# Patient Record
Sex: Female | Born: 1940 | Race: White | Hispanic: No | Smoking: Former smoker
Health system: Southern US, Community
[De-identification: ages and names within clinical notes are randomized; demographics above are authoritative.]

## PROBLEM LIST (undated history)

## (undated) DIAGNOSIS — E7800 Pure hypercholesterolemia, unspecified: Secondary | ICD-10-CM

## (undated) DIAGNOSIS — J189 Pneumonia, unspecified organism: Secondary | ICD-10-CM

## (undated) DIAGNOSIS — K219 Gastro-esophageal reflux disease without esophagitis: Secondary | ICD-10-CM

## (undated) DIAGNOSIS — IMO0001 Reserved for inherently not codable concepts without codable children: Secondary | ICD-10-CM

## (undated) DIAGNOSIS — M1990 Unspecified osteoarthritis, unspecified site: Secondary | ICD-10-CM

## (undated) DIAGNOSIS — I10 Essential (primary) hypertension: Secondary | ICD-10-CM

## (undated) HISTORY — PX: APPENDECTOMY: SHX54

## (undated) HISTORY — PX: JOINT REPLACEMENT: SHX530

## (undated) HISTORY — PX: TUBAL LIGATION: SHX77

## (undated) HISTORY — PX: TOTAL HIP ARTHROPLASTY: SHX124

---

## 2000-03-24 ENCOUNTER — Encounter: Admission: RE | Admit: 2000-03-24 | Discharge: 2000-04-15 | Payer: Self-pay | Admitting: Orthopedic Surgery

## 2000-04-15 ENCOUNTER — Encounter: Admission: RE | Admit: 2000-04-15 | Discharge: 2000-05-04 | Payer: Self-pay | Admitting: Neurosurgery

## 2001-08-25 ENCOUNTER — Emergency Department (HOSPITAL_COMMUNITY): Admission: EM | Admit: 2001-08-25 | Discharge: 2001-08-25 | Payer: Self-pay | Admitting: Emergency Medicine

## 2005-04-29 ENCOUNTER — Emergency Department (HOSPITAL_COMMUNITY): Admission: EM | Admit: 2005-04-29 | Discharge: 2005-04-29 | Payer: Self-pay | Admitting: Emergency Medicine

## 2005-05-01 ENCOUNTER — Inpatient Hospital Stay (HOSPITAL_COMMUNITY): Admission: EM | Admit: 2005-05-01 | Discharge: 2005-05-04 | Payer: Self-pay | Admitting: Emergency Medicine

## 2005-10-01 ENCOUNTER — Encounter: Admission: RE | Admit: 2005-10-01 | Discharge: 2005-10-01 | Payer: Self-pay | Admitting: Orthopedic Surgery

## 2006-01-26 ENCOUNTER — Encounter: Admission: RE | Admit: 2006-01-26 | Discharge: 2006-01-26 | Payer: Self-pay | Admitting: Orthopedic Surgery

## 2006-06-08 ENCOUNTER — Encounter: Admission: RE | Admit: 2006-06-08 | Discharge: 2006-06-08 | Payer: Self-pay | Admitting: Orthopedic Surgery

## 2006-09-12 ENCOUNTER — Inpatient Hospital Stay (HOSPITAL_COMMUNITY): Admission: EM | Admit: 2006-09-12 | Discharge: 2006-09-14 | Payer: Self-pay | Admitting: Emergency Medicine

## 2006-09-13 ENCOUNTER — Encounter (INDEPENDENT_AMBULATORY_CARE_PROVIDER_SITE_OTHER): Payer: Self-pay | Admitting: Pediatrics

## 2006-12-26 ENCOUNTER — Encounter: Admission: RE | Admit: 2006-12-26 | Discharge: 2006-12-26 | Payer: Self-pay | Admitting: Orthopedic Surgery

## 2007-01-27 ENCOUNTER — Emergency Department (HOSPITAL_COMMUNITY): Admission: EM | Admit: 2007-01-27 | Discharge: 2007-01-27 | Payer: Self-pay | Admitting: Emergency Medicine

## 2007-04-05 ENCOUNTER — Encounter: Admission: RE | Admit: 2007-04-05 | Discharge: 2007-04-05 | Payer: Self-pay | Admitting: Orthopedic Surgery

## 2008-04-19 ENCOUNTER — Encounter: Admission: RE | Admit: 2008-04-19 | Discharge: 2008-04-19 | Payer: Self-pay | Admitting: Orthopedic Surgery

## 2009-07-21 ENCOUNTER — Encounter: Admission: RE | Admit: 2009-07-21 | Discharge: 2009-07-21 | Payer: Self-pay | Admitting: Orthopedic Surgery

## 2010-02-08 HISTORY — PX: TOTAL HIP ARTHROPLASTY: SHX124

## 2010-02-17 ENCOUNTER — Encounter
Admission: RE | Admit: 2010-02-17 | Discharge: 2010-02-17 | Payer: Self-pay | Source: Home / Self Care | Attending: Orthopedic Surgery | Admitting: Orthopedic Surgery

## 2010-06-23 NOTE — Procedures (Signed)
REFERRING PHYSICIAN:  Dr. Antony Contras; this patient is referred to the  stroke service.   INDICATION:  This is a routine EEG for this patient who is described as  a 70 year old female, right-handed, with a sudden onset of difficulties  with speech and left onset of weakness.  The patient was also described  as confused and dizzy.  Unfortunately, there is no further history given  if the patient was diagnosed with a stroke or a seizure meanwhile.  Activation procedures include photic stimulation.  Hyperventilation was  deferred.   CURRENT MEDICATIONS:  Aspirin, Protonix, Zocor, Zestril, Lovenox,  Reglan, Tylenol, Vicodin.   DESCRIPTION:  This 16-channel EEG recording with 1 channel representing  heart rate and rhythm exclusively shows a high-amplitude fast beta  activity seen bitemporally and bioccipitally with the highest amplitude  depending on eye opening or eye closure.  This beta fast activity is  normally seen after benzodiazepine use, which is not listed in the  patient's medication.  Her posterior dominant rhythm is difficult to  establish; there seems to be an ongoing impedance problem.  The fastest  posterior rhythm appears to be at 10 Hz.  Bicentral spikes were noticed  over T5 and over the C4/P4 that appear isolated and seem not lead to any  epileptiform activity.  The spikes are not lateralized; they appear in  both hemispheres at the same time.  Unfortunately, there is no notation  if the patient appears drowsy or alert, which could help further  differentiate these from vertex sharp waves.  The patient appears to  have entered drowsiness by the 8th minute of this recording and then  symmetric sleep architecture is seen, but still beta fast activity is  overriding, especially the occipital recording.  Photic stimulation did  lead to photic entrainment between 7 and 17 Hz.  No electromyographic  artifact is provoked by this maneuver.  Again, hyperventilation was  deferred.   CONCLUSION:  This is an EEG with significant beta fast activity noted.  None of the medications that are normally causing this phenomenon are  listed in the patient's medication history.  I would recommend to repeat  this EEG when we can be sure that the patient did not receive any  benzodiazepine.   Sincerely,      Larey Seat, M.D.  Electronically Signed     [id number]  D:  09/13/2006 17:53:21  T:  09/14/2006 09:38:08  Job #:  [id number]

## 2010-06-23 NOTE — H&P (Signed)
NAME:  Robin [name], Robin [name]                 ACCOUNT NO.:  [id number]   MEDICAL RECORD NO.:  [id number]          PATIENT TYPE:  INP   LOCATION:  [place]                         FACILITY:  [facility]   PHYSICIAN:  Princess Bruins. Hickling, M.D.DATE OF BIRTH:  09/01/1942   DATE OF ADMISSION:  09/12/2006  DATE OF DISCHARGE:                              HISTORY & PHYSICAL   CHIEF COMPLAINT:  Unable to speak, flaccid left side, vomiting and  choking.   HISTORY OF THE PRESENT ILLNESS:  The patient is a 70 year old Caucasian  woman, married, who collapsed in the courtroom following loss of custody  of her grandson.  She developed a fixed stare on her face.  She slumped.  She was unable to speak.  En route to [facility] by EMS, she  was noted to be vomiting and had to be suctioned for her secretions.  She has never had a prior stroke and has no heart disease.  Her risk  factors for stroke include hypertension and dyslipidemia.  She has been  compliant with medications.   On initial evaluation as she entered the emergency room, I noted a fixed  gaze.  She did not respond to noxious stimuli in her limbs and her limbs  fell to the table x4.  She seemed to be laboring in her breathing.  She  was not vomiting or choking on her saliva.   The patient had a CT scan of the brain which I interpreted STAT at 1739;  this was normal.  I was able to assess her at 1750, after she had had  lead placed, blood drawn and Foley catheter inserted.   CURRENT MEDICATIONS:  1. Lisinopril 10 mg daily.  2. Pravastatin 80 mg daily.  3. Nexium 40 mg daily.  4. Hydrocodone 5/500 one every 6 hours as needed.  5. She takes other vitamins and fish oil at home.   DRUG ALLERGIES:  None known.   FAMILY HISTORY:  Negative for stroke.  Positive for myocardial  infarction and congestive heart failure, which was fatal her father,  diabetes mellitus,fatal in her mother, brain tumor, fatal in her  brother.  She has 1 brother and 3  sisters who are alive and well, other  than arthritis or other a mild conditions.   SOCIAL HISTORY:  The patient is retired from of farm working.  She is  married.  She used tobacco 30 years ago, but quit.  She does not use  alcohol or drugs.   REVIEW OF SYSTEMS:  Recorded and as negative except for headache and  postmenopausal state.   EXAMINATION:  VITAL SIGNS:  On examination today, temperature was not  measured.  Blood pressure was 132/63, resting pulse 103, respirations  20, oxygen saturation 100%.  HEENT/NECK:  No signs of infection.  Supple neck.  Full range of motion.  No cranial or cervical bruits.  No meningismus.  LUNGS:  Clear to  auscultation.  HEART:  No murmurs.  Pulses normal.  ABDOMEN:  Soft and nontender.  Bowel sounds normal.  EXTREMITIES:  Well-formed without edema, cyanosis, alterations in tone  or tight heel cords.  SKIN:  No lesions.  Vascular tone was normal.  NEUROLOGIC:  Mental Status:  Awake, alert.  She had dysarthria  initially, but this improved.  Visual fields are full.  Extraocular  movements full.  Symmetric facial strength.  Midline tongue.  Air  conduction greater than bone conduction.  Strength showed poor effort,  but she had adequate strength in her upper extremity, poor effort her  lower extremities, but she was able lift against gravity, wiggle her  toes and bear weight distally quite well.  She says she has no  hemisensory.  She had good stereoagnosis.  She had good finger-to-nose  and  heel-knee-shin.  Gait was not tested.  Reflexes were normal.  The  patient had bilateral flexor plantar responses.   IMPRESSION:  Transient ischemic attack. (435.8) I thought initially with  vomiting, problems with secretions, inability to speak and weakness on  the left side greater than right, that she might have a brainstem event.  I cannot prove this, but this may have been just as likely a psychogenic  event.   PLAN:  The patient will be admitted to  the hospital for MRI scan, MRA  and limited workup, based on the results of those studies.  Her NIH  stroke scale was 1, recorded at 6:17 p.m.  Her modified Rankin was 0.  She will be evaluated for a swallowing screen.  We will also carry out  noninvasive vascular workup, if the MRI scan shows abnormalities, and we  will carry out workup for hemoglobin A1c, homocysteine and fasting  lipid, again if the MRI scan shows evidence of stroke.  Otherwise, the  patient will be discharged home.  On her laboratories today, EKG showed  sinus tachycardia, right atrial enlargement, nonspecific changes.  Prothrombin time 13.3, INR 1.0, PTT 28.  Wajda blood cell count 7100,  hemoglobin 12, hematocrit 35.9, MCV 85.1, platelet count 375,000 with 82  neutrophils, 12 lymphocytes and 6 monocytes.   Sodium 143, potassium 4.4, chloride 108, CO2 24, BUN 16, creatinine  0.87, glucose elevated 152.  AST 16, ALT 13, calcium 9.3, total protein  7.4, albumin 3.7, total bilirubin 0.7, alkaline phosphatase 106.  For  venous pH is 7.48.      Princess Bruins. Gaynell Face, M.D.  Electronically Signed     WHH/MEDQ  D:  09/12/2006  T:  09/13/2006  Job:  [id number]

## 2010-06-23 NOTE — Discharge Summary (Signed)
NAMEBILLIJO, Robin [name]                 ACCOUNT NO.:  [id number]   MEDICAL RECORD NO.:  [id number]          PATIENT TYPE:  INP   LOCATION:  [id number]                         FACILITY:  [facility]   PHYSICIAN:  Larey Seat, M.D.  DATE OF BIRTH:  [birth year]   DATE OF ADMISSION:  09/12/2006  DATE OF DISCHARGE:  09/14/2006                               DISCHARGE SUMMARY   DIAGNOSIS AT TIME OF DISCHARGE.:  1. Likely conversion reaction versus right brain transient ischemic      attack.  2. Hypertension.  3. Dyslipidemia.  4. Gastroesophageal reflux disease.   MEDICINES AT TIME OF DISCHARGE:  1. Lisinopril 10 mg a day.  2. Pravastatin 80 mg a day.  3. Nexium 40 mg a day.  4. Hydrocodone 5/500 every 6 hours as needed.  5. Aspirin 325 mg a day.  6. Vitamins and fish oil as at home.   STUDIES PERFORMED:  1. CT of the brain on admission shows no acute abnormality.  2. MRI of the brain shows no acute stroke.  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  4. MRA of the head showed normal variant MRA circle of Willis.  5. EKG showed sinus tachycardia nonspecific ST wave abnormalities.  6. EEG showed no seizure activity.  7. Transcranial Doppler performed, results pending.  8. Carotid Doppler shows no ICA stenosis.  9. A 2-D echocardiogram shows EF of 50-55%, cannot rule out PFO.  No      likely source of embolus.   LABORATORY STUDIES:  CBC with hemoglobin 11.8, hematocrit 35.1,  neutrophils 79, otherwise normal.  Chemistry with glucose 141, otherwise  normal.  Coags normal.  Liver function tests normal.  Cardiac enzymes  negative.  Cholesterol 173, triglycerides 77, HDL 58 and LDL 100.  Urinalysis showed 0-2 red blood cells, 0-2 Gieske blood cells, 30  protein, otherwise normal.  Homocystine was 10.6, hemoglobin A1c 5.6.  Urine drug screen negative.   HISTORY OF PRESENT ILLNESS:  [name] is a 70 year old right-  handed, Caucasian, married female who  collapsed in the court room  following loss of custody of her grandson.  She developed a fixed stare  on her face.  She slumped over.  She was unable to speak.  En route to  [facility] by EMS, she was noted to be vomiting and had to be suctioned  for her secretions.  She has no prior history of stroke or heart  disease.  She has risk factors of hypertension and dyslipidemia.  She  has been compliant with her medications at home.   On initial evaluation as she entered to the emergency room, she had a  fixed gaze.  She did not respond to noxious stimuli in her limbs, and  her limbs were flaccid and fell to the table x4.  She seemed to be  labored in her breathing.  She was not vomiting at that time.  CT of the  brain showed no acute abnormalities.  TPA was considered.  TPA was not  given as she  had improvement in the emergency room and her neurologic  exam was questionably functional.  Thoughts are that she may have a  brainstem event.  She will be admitted to the hospital and further  workup there.   HOSPITAL COURSE:  MRI was negative for acute stroke.  A 2-D  echocardiogram, carotid Doppler and EEG were all unremarkable.  Hemoglobin A1c and homocystine were within normal limits.  The patient  had no acute stroke and no residual symptoms.  It is felt her symptoms  were most likely psychogenic in nature with no neurologic follow up  needed.  There does remain a small possibility this could have been a  TIA, so we do recommend ongoing risk factor control.   CONDITION ON DISCHARGE:  The patient alert and oriented x3..  No  aphasia, dysarthria.  Her extraocular movements are full.  She has no  focal deficits.  No arm drift, no sensory loss.  She has good control of  all extremities and normal strength.  Her heart rate is regular.  Breath  sounds were clear.   DISCHARGE/PLAN:  1. Discharge home with family.  2. Aspirin for stroke prevention.  3. Recommend ongoing stroke risk factor  control.  4. No neurologic follow up needed at this point, but will be glad to      follow up should situation change.  5. Follow up with Particia Nearing, primary care physician, [place], in      one month.      Burnetta Sabin, N.P.      Larey Seat, M.D.  Electronically Signed    SB/MEDQ  D:  09/14/2006  T:  09/14/2006  Job:  [id number]   cc:   Larey Seat, M.D.  [place] Dr. Particia Nearing

## 2010-06-26 NOTE — H&P (Signed)
NAMELABRENDA, RAQUEL NO.:  [id number]   MEDICAL RECORD NO.:  [id number]          PATIENT TYPE:  INP   LOCATION:  [id number]                         [facility]ACILITY:  [facility]   PHYSICIAN:  Jerelene Redden, MD      DATE OF BIRTH:  April 26, 1940   DATE OF ADMISSION:  05/01/2005  DATE OF DISCHARGE:                                HISTORY & PHYSICAL   Robin [name] is a pleasant 70 year old lady who states that although she  generally has problems with arthritis, particularly in her feet, she  generally gets along fairly well.  However, on Thursday morning, when she  went to get out of bed, she discovered that she had severe pain in the right  sacroiliac area radiating around to the anterior portion of the leg and down  to the knee.  This pain prevented her from raising her leg or from getting  out of bed.  When others helped her to get out of bed and she bore or weight  on her right leg the pain was extremely severe and although she was able to  stand for a short period she was not able to walk.  Since that time, she has  essentially been unable to get out of bed.  She first went to [facility] where a x-ray of her right hip was done which showed only  degenerative joint disease.  On arrival to [facility], she was  sent for a CT scan of the pelvis which again showed right hip degenerative  joint disease been no other findings.  She has not had any associated fever,  headache, breathing difficulty, chest pain, nausea, vomiting, abdominal  pain, change in bowel habits, melena, hematochezia or dysuria.  Because of  the severity of the pain and the inability to stand, she is admitted at this  time for further evaluation and treatment.   PAST MEDICAL HISTORY:  Medications consist of Protonix 40 mg daily,  lisinopril 10 mg daily, Celebrex 200 mg daily, Percocet p.r.n. for pain and  Fosamax 70 mg weekly.  She is allergic to DILAUDID.  She states that she has  had no  operations.  She states that she has never had any previous problems  with heart disease, lung disease or kidney problems.   MEDICAL ILLNESSES:  1.  Gastroesophageal reflux.  2.  Hypertension.  3.  Degenerative joint disease.  4.  Osteoporosis.   FAMILY HISTORY:  Notable for heart disease in the patient's father who died  at the age of 58 of  a heart attack.  Her mother apparently died of  complications of diabetes.   SOCIAL HISTORY:  She does not smoke.  She does not use alcohol.  She does  not use drugs.   REVIEW OF SYSTEMS:  HEAD:  She denies headache or dizziness.  EYES:  She  denies visual blurring or diplopia.  EARS, NOSE AND THROAT:  Denies earache,  sinus pain or sore throat.  CHEST:  Denies coughing, wheezing or chest  congestion.  CARDIOVASCULAR:  Denies orthopnea, PND or ankle edema.  GI:  Denies nausea, vomiting, abdominal pain, change in bowel habits, melena or  hematochezia.  GU:  Denies dysuria, urinary frequency, hesitancy, nocturia  or hematuria.  NEURO:  There is no history of seizure or stroke.  ENDOCRINE:  Denies excessive thirst, urinary frequency or nocturia.   PHYSICAL EXAM:  Her temperature is 98, blood pressure 138/68, pulse is 92,  respirations 16, O2 saturations 99%.  HEENT exam is within normal limits.  The chest is clear.  Examination of the back reveals significant tenderness  over the right sacroiliac area.  Cardiovascular reveals normal S1-S2 without  rubs, murmurs or gallops.  The abdomen exam was fairly cursory because the  patient was examined out in the hall but her abdomen seem to be nontender.  There was no guarding or rebound.  On neurologic testing, cranial nerves,  motor, sensory and cerebellar testing was normal.  Straight leg raise of the  right leg was positive at about 30 degrees, producing pain in the right  sacroiliac area. Examination of the extremities revealed no evidence of  cyanosis or edema.   IMPRESSION:  1.  A three-day  history of acute severe right-sided sacroiliac pain      radiating down the right leg.  I suspect that the origin of this pain is      probably the patient's back.  This could be secondary to herniated disk,      spinal stenosis or compression fracture.  I think an MRI scan of the      back will be indicated at this time.  2.  Degenerative joint disease.  3.  Gastroesophageal reflux.  4.  Hypertension.  5.  Osteoporosis.   We will treat the patient's pain with appropriate medications and schedule  MRI scan of her lumbar spine.  I am also going to empirically initiate some  cortisone medication to see if this is of benefit to her.           ______________________________  Jerelene Redden, MD     SY/MEDQ  D:  05/01/2005  T:  05/03/2005  Job:  [id number]   cc:   Octavio Graves  Fax: [phone]

## 2010-06-26 NOTE — Discharge Summary (Signed)
NAME:  Robin [name], Robin [name]                 ACCOUNT NO.:  [id number]   MEDICAL RECORD NO.:  [id number]          PATIENT TYPE:  INP   LOCATION:  [place]                         FACILITY:  [facility]   PHYSICIAN:  Melissa L. Lovena Le, MD  DATE OF BIRTH:  1940/07/31   DATE OF ADMISSION:  05/01/2005  DATE OF DISCHARGE:  05/04/2005                                 DISCHARGE SUMMARY   ADMISSION DIAGNOSIS:  Severe right-sided leg pain.   HOSPITAL COURSE:  #1.  SEVERE ARTHRITIS OF THE RIGHT HIP:  The patient had an MRI of the back  which showed no obvious source for her pain in the lumbar area.  She also  had CT of the hip which showed no obvious fracture.  X-ray also showed no  fracture.  The patient was evaluated by Dr. Paralee Cancel who confirmed that,  indeed, this did suggest severe arthritis.  The patient underwent  radiologically guided injection of her hip area in attempt to give her some  relief.  Should she continue to have difficulty, she will follow up with Dr.  Alvan Dame in approximately 1 to 2 weeks to determine what the next course of  action should be and consideration for surgery if possible.  The patient  will continue on Celebrex and Fosamax as an outpatient.  The patient states  that she has Percocet prescription at home and, therefore, do not need any  medication.   #2.  URINARY TRACT INFECTION:  The patient was started on Cipro in response  to finding of a UTI.  Her culture is pending, and I will follow up on it in  the a.m.  Should it not be sensitive to Cipro, I will call the patient and  her primary care physician, but at this time, Cipro for a full 3-day course  should be sufficient.   #3.  GASTROESOPHAGEAL REFLUX DISEASE:  The patient will continue on  Protonix.   #4.  HYPERTENSION:  The patient will continue on Lisinopril.   #5.  NEWLY DIAGNOSED HYPOTHYROIDISM: TSH was obtained during this hospital  course which showed slight elevation.  Therefore, a repeat with T3 and T4  was  obtained which showed abnormalities suggesting hypothyroidism. The  patient will be started on Synthroid 25 mcg which will need to be titrated  according to effect.  The patient probably should have an ultrasound of her  thyroid to explore any thyroid process that would be causing this.  I would  recommend seeing Dr. Particia Nearing next week to follow up on her thyroid  workup.   DISCHARGE MEDICATIONS:  1.  Protonix 40 mg once daily.  2.  Lisinopril 10 mg once daily.  3.  Celebrex 200 mg once daily.  4.  Fosamax 70 mg.  5.  Percocet 5/325 mg one to two tablets every 4 hours as needed.  6.  Synthroid 25 mcg once daily.  7.  Cipro 500 mg, 2 more doses, then discontinue.   The patient will be instructed to follow up with Dr. Particia Nearing next week  to follow up on her thyroid.  She  is instructed to follow up with Dr. Alvan Dame 1  to 2 weeks after discharge to discuss further plans for her hip.   STUDIES DURING HOSPITAL COURSE:  1.  As mentioned, a CT of the hip, which was negative for fracture, but      significant for arthritis of the right hip.  2.  MRI of the lumbar spine which showed disk bulge at L5-S1 without      herniation or stenosis and mild facet degeneration at L4-L5, L5-S1      bilaterally.  3.  She also had an x-ray of the hip which showed degenerative changes in      her right hip.   HISTORY OF PRESENT ILLNESS:  The patient is a very pleasant 70 year old  [place] female who presented to the emergency room with a 3-day history of  excruciating right-sided hip pain.  She was unable to bear weight on  admission and, therefore, came to the emergency room when could no longer  take the pain.  The patient was admitted to the hospital.  She was initially  given steroids which were discontinued.  She was further treated with pain  medications while imaging was obtained.  No obvious fracture was located.  Therefore, the patient was evaluated by orthopedics.  She underwent an  injection  of her right hip under radiologic guidance with steroids. She will  follow up with Dr. Alvan Dame as an outpatient to see if the effect is appropriate  or if they need to press on to more aggressive therapy.   During the course of hospital stay, the patient was diagnosed with a urinary  tract infection and treated with Cipro 500 p.o. twice daily.  She will  complete one more day of antibiotics at home and then discontinue.  I will  follow up with her urinary culture to assure that the antibiotic is  appropriate coverage for the bacteria growing.   The patient complained of some constipation and fatigue.  Therefore, TSH was  obtained. Her TSH was noted to be increased with a normal T3 and T4.  Therefore, a diagnosis of new onset hypothyroidism was made.  The patient  needs to follow up with her primary care physician for further workup.  I  would recommend an ultrasound of the thyroid and repeat thyroid studies as  an outpatient.  I will discharge her to home on low-dose Synthroid which  will need to be up titrated.   On the day of discharge, the patient's vital signs have remained relatively  stable.  Blood pressure 136/72, pulse 82, respirations 20, O2 saturation  98%.  Her blood sugars have remained within normal limits.  Her saturations  are normal at 98%. Generally, this is a pleasant [place] female in no acute  distress. Normocephalic and atraumatic.  Pupils equal, round, and reactive  to light.  Extraocular movements intact.  Mucous membranes are moist.  Neck  is supple.  There is no JVD, no lymph nodes, and no carotid bruits.  Her  chest is clear to auscultation.  There are no rhonchi, rales, or wheezes.  Cardiovascular is regular rate and rhythm.  S1 and S2.  No S3, S4, murmurs,  rubs, or gallops.  Abdomen is soft, nontender, nondistended with positive  bowel sounds. The patient had minimal pain on palpation in the right inguinal area.  Neurologically, she is intact and able to bear  weight at  this time.  Power is 5/5 in all extremities with 4/5 in the  right  extremities secondary to pain.   At this time, the patient is deemed stable for discharge to follow up as an  outpatient for her thyroid disease and her further orthopedic needs per Dr.  Alvan Dame.   CONDITION ON DISCHARGE:  Stable.   DISPOSITION:  To home.      Melissa L. Lovena Le, MD  Electronically Signed     MLT/MEDQ  D:  05/04/2005  T:  05/04/2005  Job:  [id number]   cc:   Pietro Cassis Alvan Dame, M.D.  Fax: [phone]   Particia Nearing, M.D.  [employer]

## 2010-09-24 ENCOUNTER — Emergency Department (HOSPITAL_COMMUNITY)
Admission: EM | Admit: 2010-09-24 | Discharge: 2010-09-24 | Disposition: A | Discharge status: Home / Self Care | Payer: Medicare Other | Attending: Emergency Medicine | Admitting: Emergency Medicine

## 2010-09-24 DIAGNOSIS — K219 Gastro-esophageal reflux disease without esophagitis: Secondary | ICD-10-CM | POA: Insufficient documentation

## 2010-09-24 DIAGNOSIS — Z7982 Long term (current) use of aspirin: Secondary | ICD-10-CM | POA: Insufficient documentation

## 2010-09-24 DIAGNOSIS — M7989 Other specified soft tissue disorders: Secondary | ICD-10-CM | POA: Insufficient documentation

## 2010-09-24 DIAGNOSIS — Z7901 Long term (current) use of anticoagulants: Secondary | ICD-10-CM | POA: Insufficient documentation

## 2010-09-24 DIAGNOSIS — Z8673 Personal history of transient ischemic attack (TIA), and cerebral infarction without residual deficits: Secondary | ICD-10-CM | POA: Insufficient documentation

## 2010-09-24 DIAGNOSIS — M79609 Pain in unspecified limb: Secondary | ICD-10-CM | POA: Insufficient documentation

## 2010-09-24 DIAGNOSIS — I824Z9 Acute embolism and thrombosis of unspecified deep veins of unspecified distal lower extremity: Secondary | ICD-10-CM | POA: Insufficient documentation

## 2010-09-24 DIAGNOSIS — M129 Arthropathy, unspecified: Secondary | ICD-10-CM | POA: Insufficient documentation

## 2010-09-24 DIAGNOSIS — I10 Essential (primary) hypertension: Secondary | ICD-10-CM | POA: Insufficient documentation

## 2010-09-24 DIAGNOSIS — Z79899 Other long term (current) drug therapy: Secondary | ICD-10-CM | POA: Insufficient documentation

## 2010-09-24 LAB — CBC
Hemoglobin: 9.3 g/dL — ABNORMAL LOW (ref 12.0–15.0)
MCHC: 32 g/dL (ref 30.0–36.0)
MCV: 89 fL (ref 78.0–100.0)
Platelets: 458 10*3/uL — ABNORMAL HIGH (ref 150–400)
RDW: 15.3 % (ref 11.5–15.5)
WBC: 8.7 10*3/uL (ref 4.0–10.5)

## 2010-09-24 LAB — POCT I-STAT, CHEM 8
Creatinine, Ser: 1.4 mg/dL — ABNORMAL HIGH (ref 0.50–1.10)
HCT: 28 % — ABNORMAL LOW (ref 36.0–46.0)
TCO2: 29 mmol/L (ref 0–100)

## 2010-09-24 LAB — PROTIME-INR: INR: 3.58 — ABNORMAL HIGH (ref 0.00–1.49)

## 2010-11-23 LAB — URINALYSIS, ROUTINE W REFLEX MICROSCOPIC
Bilirubin Urine: NEGATIVE
Nitrite: NEGATIVE
Protein, ur: 30 — AB
Specific Gravity, Urine: 1.023
Urobilinogen, UA: 1

## 2010-11-23 LAB — COMPREHENSIVE METABOLIC PANEL
ALT: 13
AST: 17
Albumin: 3.7
Alkaline Phosphatase: 95
BUN: 16
CO2: 24
CO2: 27
Calcium: 9.3
Chloride: 109
GFR calc Af Amer: >60
GFR calc non Af Amer: >60
Glucose, Bld: 141 — ABNORMAL HIGH
Potassium: 4.5
Sodium: 143
Sodium: 143
Total Bilirubin: 0.7
Total Protein: 7.4

## 2010-11-23 LAB — DIFFERENTIAL
Basophils Absolute: 0
Basophils Relative: 0
Basophils Relative: 0
Eosinophils Absolute: 0
Eosinophils Absolute: 0
Eosinophils Relative: 0
Eosinophils Relative: 0
Lymphs Abs: 0.9
Monocytes Absolute: 0.4
Monocytes Relative: 6
Neutrophils Relative %: 79 — ABNORMAL HIGH
Neutrophils Relative %: 82 — ABNORMAL HIGH

## 2010-11-23 LAB — CBC
HCT: 35.1 — ABNORMAL LOW
HCT: 35.9 — ABNORMAL LOW
Hemoglobin: 11.8 — ABNORMAL LOW
Hemoglobin: 12
MCHC: 33.7
MCV: 85.1
MCV: 85.3
RBC: 4.11
RBC: 4.22
WBC: 7.1

## 2010-11-23 LAB — CK TOTAL AND CKMB (NOT AT ARMC)
CK, MB: 2.3
Relative Index: INVALID

## 2010-11-23 LAB — LIPID PANEL
HDL: 58
LDL Cholesterol: 100 — ABNORMAL HIGH
Total CHOL/HDL Ratio: 3
VLDL: 15

## 2010-11-23 LAB — URINE MICROSCOPIC-ADD ON

## 2010-11-23 LAB — TROPONIN I: Troponin I: 0.01

## 2010-11-23 LAB — RAPID URINE DRUG SCREEN, HOSP PERFORMED
Amphetamines: NOT DETECTED
Opiates: NOT DETECTED
Tetrahydrocannabinol: NOT DETECTED

## 2010-11-23 LAB — PROTIME-INR
INR: 1
Prothrombin Time: 13.2

## 2010-11-23 LAB — I-STAT 8, (EC8 V) (CONVERTED LAB)
Acid-Base Excess: 1
BUN: 16
Bicarbonate: 23.6
HCT: 38
Operator id: 272551
pCO2, Ven: 31.7 — ABNORMAL LOW

## 2010-11-23 LAB — APTT: aPTT: 28

## 2010-11-23 LAB — HOMOCYSTEINE: Homocysteine: 10.6

## 2010-11-23 LAB — HEMOGLOBIN A1C: Mean Plasma Glucose: 122

## 2010-11-23 LAB — POCT I-STAT CREATININE: Creatinine, Ser: 0.9

## 2012-10-04 DIAGNOSIS — N200 Calculus of kidney: Secondary | ICD-10-CM | POA: Insufficient documentation

## 2012-11-28 ENCOUNTER — Encounter (HOSPITAL_COMMUNITY): Payer: Self-pay | Admitting: Emergency Medicine

## 2012-11-28 ENCOUNTER — Emergency Department (HOSPITAL_COMMUNITY): Payer: Medicare Other

## 2012-11-28 ENCOUNTER — Emergency Department (HOSPITAL_COMMUNITY)
Admission: EM | Admit: 2012-11-28 | Discharge: 2012-11-28 | Disposition: A | Discharge status: Home / Self Care | Payer: Medicare Other | Attending: Emergency Medicine | Admitting: Emergency Medicine

## 2012-11-28 DIAGNOSIS — Z8639 Personal history of other endocrine, nutritional and metabolic disease: Secondary | ICD-10-CM | POA: Insufficient documentation

## 2012-11-28 DIAGNOSIS — R0602 Shortness of breath: Secondary | ICD-10-CM | POA: Insufficient documentation

## 2012-11-28 DIAGNOSIS — K219 Gastro-esophageal reflux disease without esophagitis: Secondary | ICD-10-CM | POA: Insufficient documentation

## 2012-11-28 DIAGNOSIS — Z79899 Other long term (current) drug therapy: Secondary | ICD-10-CM | POA: Insufficient documentation

## 2012-11-28 DIAGNOSIS — M129 Arthropathy, unspecified: Secondary | ICD-10-CM | POA: Insufficient documentation

## 2012-11-28 DIAGNOSIS — Z87891 Personal history of nicotine dependence: Secondary | ICD-10-CM | POA: Insufficient documentation

## 2012-11-28 DIAGNOSIS — R002 Palpitations: Secondary | ICD-10-CM | POA: Insufficient documentation

## 2012-11-28 DIAGNOSIS — Z862 Personal history of diseases of the blood and blood-forming organs and certain disorders involving the immune mechanism: Secondary | ICD-10-CM | POA: Insufficient documentation

## 2012-11-28 DIAGNOSIS — Z86718 Personal history of other venous thrombosis and embolism: Secondary | ICD-10-CM | POA: Insufficient documentation

## 2012-11-28 HISTORY — DX: Gastro-esophageal reflux disease without esophagitis: K21.9

## 2012-11-28 HISTORY — DX: Reserved for inherently not codable concepts without codable children: IMO0001

## 2012-11-28 HISTORY — DX: Unspecified osteoarthritis, unspecified site: M19.90

## 2012-11-28 HISTORY — DX: Essential (primary) hypertension: I10

## 2012-11-28 HISTORY — DX: Pure hypercholesterolemia, unspecified: E78.00

## 2012-11-28 LAB — CBC WITH DIFFERENTIAL/PLATELET
Basophils Relative: 0 % (ref 0–1)
Eosinophils Absolute: 0.1 10*3/uL (ref 0.0–0.7)
Eosinophils Relative: 1 % (ref 0–5)
HCT: 35 % — ABNORMAL LOW (ref 36.0–46.0)
Hemoglobin: 10.6 g/dL — ABNORMAL LOW (ref 12.0–15.0)
MCHC: 30.3 g/dL (ref 30.0–36.0)
MCV: 90 fL (ref 78.0–100.0)
Monocytes Relative: 6 % (ref 3–12)
Neutro Abs: 5.6 10*3/uL (ref 1.7–7.7)
Neutrophils Relative %: 73 % (ref 43–77)
RBC: 3.89 MIL/uL (ref 3.87–5.11)

## 2012-11-28 LAB — COMPREHENSIVE METABOLIC PANEL
ALT: 10 U/L (ref 0–35)
AST: 13 U/L (ref 0–37)
Albumin: 3.6 g/dL (ref 3.5–5.2)
Alkaline Phosphatase: 86 U/L (ref 39–117)
BUN: 35 mg/dL — ABNORMAL HIGH (ref 6–23)
Calcium: 9.8 mg/dL (ref 8.4–10.5)
Chloride: 105 mEq/L (ref 96–112)
Potassium: 4.8 mEq/L (ref 3.5–5.1)
Total Bilirubin: 0.2 mg/dL — ABNORMAL LOW (ref 0.3–1.2)

## 2012-11-28 LAB — TROPONIN I: Troponin I: <0.3 ng/mL (ref <0.30)

## 2012-11-28 MED ORDER — TECHNETIUM TO 99M ALBUMIN AGGREGATED
6.0000 | Freq: Once | INTRAVENOUS | Status: AC | PRN
  Administered 2012-11-28: 6 via INTRAVENOUS

## 2012-11-28 MED ORDER — TECHNETIUM TC 99M DIETHYLENETRIAME-PENTAACETIC ACID
40.0000 | Freq: Once | INTRAVENOUS | Status: AC | PRN
  Administered 2012-11-28: 40 via RESPIRATORY_TRACT

## 2012-11-28 NOTE — ED Provider Notes (Signed)
CSN: [id number]     Arrival date & time 11/28/12  1202 History   This chart was scribed for Robin Diego, MD by Ivar Drape, ED Scribe. This patient was seen in room APA06/APA06 and the patient's care was started 1:24 PM.    Chief Complaint  Patient presents with  . Tachycardia   Patient is a 72 y.o. female presenting with palpitations. The history is provided by the patient. No language interpreter was used.  Palpitations Palpitations quality:  Fast Onset quality:  Sudden Duration:  15 minutes Timing:  Intermittent Progression:  Unchanged Chronicity:  New Relieved by:  None tried Worsened by:  Nothing tried Ineffective treatments:  None tried Associated symptoms: shortness of breath   Associated symptoms: no back pain, no chest pain and no cough    HPI Comments: Robin [name] is a 72 y.o. female who presents to the Emergency Department complaining of palpitations that occur about 3 times per day and last for about 15 minutes onset 1 month ago. Pt's heart rate sometimes elevates to 135. Pt also complains of associated SOB onset during the episodes of palpitations. Pt was referred to Ed by her PCP. Pt denies any pain or any other symptoms. Pt has h/o DVT.  Pt's PCP is Dr. Particia Nearing.    Past Medical History  Diagnosis Date  . Reflux   . Hypercholesteremia   . Arthritis    Past Surgical History  Procedure Laterality Date  . Total hip arthroplasty    . Tubal ligation     No family history on file. History  Substance Use Topics  . Smoking status: Former Research scientist (life sciences)  . Smokeless tobacco: Not on file  . Alcohol Use: No   OB History   Grav Para Term Preterm Abortions TAB SAB Ect Mult Living                 Review of Systems  Constitutional: Negative for appetite change and fatigue.  HENT: Negative for congestion, ear discharge and sinus pressure.   Eyes: Negative for discharge.  Respiratory: Positive for shortness of breath. Negative for cough.   Cardiovascular:  Positive for palpitations. Negative for chest pain.  Gastrointestinal: Negative for abdominal pain and diarrhea.  Genitourinary: Negative for frequency and hematuria.  Musculoskeletal: Negative for back pain.  Skin: Negative for rash.  Neurological: Negative for seizures and headaches.  Psychiatric/Behavioral: Negative for hallucinations.  All other systems reviewed and are negative.    Allergies  Dilaudid  Home Medications   Current Outpatient Rx  Name  Route  Sig  Dispense  Refill  . cholecalciferol (VITAMIN D) 1000 UNITS tablet   Oral   Take 1,000 Units by mouth daily.         Marland Kitchen esomeprazole (NEXIUM) 40 MG capsule   Oral   Take 40 mg by mouth daily before breakfast.         . loratadine (CLARITIN) 10 MG tablet   Oral   Take 10 mg by mouth daily as needed for allergies.         . meloxicam (MOBIC) 7.5 MG tablet   Oral   Take 7.5 mg by mouth daily.         . niacin (NIASPAN) 500 MG CR tablet   Oral   Take 1,000 mg by mouth daily.          Triage Vitals: BP 120/85  Pulse 88  Temp(Src) 98.6 F (37 C) (Oral)  Resp 14  Ht 5\' 3"  (1.6  m)  Wt 138 lb (62.596 kg)  BMI 24.45 kg/m2  SpO2 100%  Physical Exam  Nursing note and vitals reviewed. Constitutional: She is oriented to person, place, and time. She appears well-developed and well-nourished. No distress.  HENT:  Head: Normocephalic.  Eyes: Conjunctivae and EOM are normal. No scleral icterus.  Neck: Normal range of motion. Neck supple. No thyromegaly present.  Cardiovascular: Normal rate, regular rhythm and normal heart sounds.  Exam reveals no gallop and no friction rub.   No murmur heard. Pulmonary/Chest: Effort normal and breath sounds normal. No stridor. She has no wheezes. She has no rales. She exhibits no tenderness.  Abdominal: Soft. Bowel sounds are normal. She exhibits no distension. There is no tenderness. There is no rebound.  Musculoskeletal: Normal range of motion. She exhibits no edema.   Lymphadenopathy:    She has no cervical adenopathy.  Neurological: She is alert and oriented to person, place, and time. She exhibits normal muscle tone. Coordination normal.  Skin: Skin is warm and dry. No rash noted. She is not diaphoretic. No erythema.  Psychiatric: She has a normal mood and affect. Her behavior is normal.    ED Course  Procedures (including critical care time) DIAGNOSTIC STUDIES: Oxygen Saturation is 100% on room air, normal by my interpretation.    COORDINATION OF CARE: 1:29 PM-Discussed treatment plan which includes EKG with pt at bedside and pt agreed to plan.   Labs Review Labs Reviewed  CBC WITH DIFFERENTIAL - Abnormal; Notable for the following:    Hemoglobin 10.6 (*)    HCT 35.0 (*)    RDW 16.0 (*)    All other components within normal limits  COMPREHENSIVE METABOLIC PANEL - Abnormal; Notable for the following:    Glucose, Bld 144 (*)    BUN 35 (*)    Creatinine, Ser 2.20 (*)    Total Bilirubin 0.2 (*)    GFR calc non Af Amer 21 (*)    GFR calc Af Amer 25 (*)    All other components within normal limits  D-DIMER, QUANTITATIVE - Abnormal; Notable for the following:    D-Dimer, Quant 0.75 (*)    All other components within normal limits  TROPONIN I  TSH  T4   Imaging Review Dg Chest 2 View  11/28/2012   CLINICAL DATA:  Tachycardia  EXAM: CHEST  2 VIEW  COMPARISON:  None.  FINDINGS: Mild hyperinflation. Heart is upper limits normal in size. Lungs are clear. No effusions or acute bony abnormality.  IMPRESSION: No active cardiopulmonary disease.   Electronically Signed   By: Rolm Baptise M.D.   On: 11/28/2012 14:02    EKG Interpretation     Ventricular Rate:  84 PR Interval:  110 QRS Duration: 78 QT Interval:  368 QTC Calculation: 434 R Axis:   67 Text Interpretation:  Sinus rhythm with short PR Nonspecific T wave abnormality Abnormal ECG No previous ECGs available           Medications - No data to display  MDM  No diagnosis  found.  The chart was scribed for me under my direct supervision.  I personally performed the history, physical, and medical decision making and all procedures in the evaluation of this patient.Robin Diego, MD 11/28/12 [phone]

## 2012-11-28 NOTE — ED Notes (Signed)
Pt states intermittent "heart racing" with rate of 135 at times x 1 mo. Associated system of SOB. Denies pain with episodes. States symptoms are usually occur first thing in the morning and 2 other times during the day. NAD at this time. Sent over by EDP due to hx of DVT's per pt.

## 2012-11-28 NOTE — ED Notes (Signed)
Discharge instructions reviewed with pt, questions answered. Pt verbalized understanding.  

## 2013-01-08 DIAGNOSIS — D631 Anemia in chronic kidney disease: Secondary | ICD-10-CM

## 2013-01-08 DIAGNOSIS — D649 Anemia, unspecified: Secondary | ICD-10-CM

## 2014-03-15 DIAGNOSIS — N183 Chronic kidney disease, stage 3 unspecified: Secondary | ICD-10-CM | POA: Insufficient documentation

## 2014-03-15 DIAGNOSIS — R002 Palpitations: Secondary | ICD-10-CM | POA: Insufficient documentation

## 2014-03-15 DIAGNOSIS — S0003XA Contusion of scalp, initial encounter: Secondary | ICD-10-CM | POA: Insufficient documentation

## 2014-03-15 DIAGNOSIS — E782 Mixed hyperlipidemia: Secondary | ICD-10-CM | POA: Insufficient documentation

## 2014-03-15 DIAGNOSIS — S069X1A Unspecified intracranial injury with loss of consciousness of 30 minutes or less, initial encounter: Secondary | ICD-10-CM | POA: Insufficient documentation

## 2014-03-15 DIAGNOSIS — R55 Syncope and collapse: Secondary | ICD-10-CM | POA: Insufficient documentation

## 2014-03-15 DIAGNOSIS — K219 Gastro-esophageal reflux disease without esophagitis: Secondary | ICD-10-CM | POA: Insufficient documentation

## 2014-06-06 ENCOUNTER — Encounter: Payer: Self-pay | Admitting: Physician Assistant

## 2014-08-04 ENCOUNTER — Emergency Department (HOSPITAL_COMMUNITY): Payer: Medicare Other

## 2014-08-04 ENCOUNTER — Emergency Department (HOSPITAL_COMMUNITY)
Admission: EM | Admit: 2014-08-04 | Discharge: 2014-08-04 | Disposition: A | Discharge status: Home / Self Care | Payer: Medicare Other | Attending: Emergency Medicine | Admitting: Emergency Medicine

## 2014-08-04 ENCOUNTER — Encounter (HOSPITAL_COMMUNITY): Payer: Self-pay

## 2014-08-04 DIAGNOSIS — Z8639 Personal history of other endocrine, nutritional and metabolic disease: Secondary | ICD-10-CM | POA: Insufficient documentation

## 2014-08-04 DIAGNOSIS — J189 Pneumonia, unspecified organism: Secondary | ICD-10-CM

## 2014-08-04 DIAGNOSIS — J159 Unspecified bacterial pneumonia: Secondary | ICD-10-CM | POA: Insufficient documentation

## 2014-08-04 DIAGNOSIS — Z79899 Other long term (current) drug therapy: Secondary | ICD-10-CM | POA: Insufficient documentation

## 2014-08-04 DIAGNOSIS — Z87891 Personal history of nicotine dependence: Secondary | ICD-10-CM | POA: Insufficient documentation

## 2014-08-04 DIAGNOSIS — R05 Cough: Secondary | ICD-10-CM

## 2014-08-04 DIAGNOSIS — R6883 Chills (without fever): Secondary | ICD-10-CM | POA: Diagnosis present

## 2014-08-04 DIAGNOSIS — R51 Headache: Secondary | ICD-10-CM | POA: Diagnosis not present

## 2014-08-04 DIAGNOSIS — I10 Essential (primary) hypertension: Secondary | ICD-10-CM | POA: Diagnosis not present

## 2014-08-04 DIAGNOSIS — Z791 Long term (current) use of non-steroidal anti-inflammatories (NSAID): Secondary | ICD-10-CM | POA: Diagnosis not present

## 2014-08-04 DIAGNOSIS — K219 Gastro-esophageal reflux disease without esophagitis: Secondary | ICD-10-CM | POA: Diagnosis not present

## 2014-08-04 DIAGNOSIS — R059 Cough, unspecified: Secondary | ICD-10-CM

## 2014-08-04 DIAGNOSIS — M1990 Unspecified osteoarthritis, unspecified site: Secondary | ICD-10-CM | POA: Insufficient documentation

## 2014-08-04 LAB — CBC WITH DIFFERENTIAL/PLATELET
BASOS PCT: 0 % (ref 0–1)
Basophils Absolute: 0 10*3/uL (ref 0.0–0.1)
EOS ABS: 0 10*3/uL (ref 0.0–0.7)
EOS PCT: 0 % (ref 0–5)
HEMATOCRIT: 36.1 % (ref 36.0–46.0)
HEMOGLOBIN: 11.2 g/dL — AB (ref 12.0–15.0)
LYMPHS ABS: 1 10*3/uL (ref 0.7–4.0)
Lymphocytes Relative: 8 % — ABNORMAL LOW (ref 12–46)
MCH: 27.1 pg (ref 26.0–34.0)
MCHC: 31 g/dL (ref 30.0–36.0)
MCV: 87.2 fL (ref 78.0–100.0)
MONO ABS: 0.8 10*3/uL (ref 0.1–1.0)
Monocytes Relative: 6 % (ref 3–12)
NEUTROS PCT: 86 % — AB (ref 43–77)
Neutro Abs: 11.2 10*3/uL — ABNORMAL HIGH (ref 1.7–7.7)
PLATELETS: 302 10*3/uL (ref 150–400)
RBC: 4.14 MIL/uL (ref 3.87–5.11)
RDW: 14.6 % (ref 11.5–15.5)
WBC: 13 10*3/uL — AB (ref 4.0–10.5)

## 2014-08-04 LAB — BASIC METABOLIC PANEL
Anion gap: 10 (ref 5–15)
BUN: 12 mg/dL (ref 6–20)
CALCIUM: 8.7 mg/dL — AB (ref 8.9–10.3)
CO2: 28 mmol/L (ref 22–32)
Chloride: 103 mmol/L (ref 101–111)
Creatinine, Ser: 0.87 mg/dL (ref 0.44–1.00)
GFR calc non Af Amer: >60 mL/min (ref >60)
Glucose, Bld: 91 mg/dL (ref 65–99)
Potassium: 3.6 mmol/L (ref 3.5–5.1)
SODIUM: 141 mmol/L (ref 135–145)

## 2014-08-04 LAB — URINALYSIS, ROUTINE W REFLEX MICROSCOPIC
Bilirubin Urine: NEGATIVE
Glucose, UA: NEGATIVE mg/dL
Hgb urine dipstick: NEGATIVE
Ketones, ur: NEGATIVE mg/dL
Leukocytes, UA: NEGATIVE
Nitrite: NEGATIVE
Protein, ur: NEGATIVE mg/dL
Specific Gravity, Urine: 1.01 (ref 1.005–1.030)
Urobilinogen, UA: 1 mg/dL (ref 0.0–1.0)
pH: 7 (ref 5.0–8.0)

## 2014-08-04 LAB — HEPATIC FUNCTION PANEL
ALT: 12 U/L — AB (ref 14–54)
AST: 16 U/L (ref 15–41)
Albumin: 3.4 g/dL — ABNORMAL LOW (ref 3.5–5.0)
Alkaline Phosphatase: 120 U/L (ref 38–126)
BILIRUBIN INDIRECT: 0.9 mg/dL (ref 0.3–0.9)
Bilirubin, Direct: 0.2 mg/dL (ref 0.1–0.5)
Total Bilirubin: 1.1 mg/dL (ref 0.3–1.2)
Total Protein: 6.5 g/dL (ref 6.5–8.1)

## 2014-08-04 LAB — TROPONIN I

## 2014-08-04 LAB — I-STAT CG4 LACTIC ACID, ED: Lactic Acid, Venous: 0.72 mmol/L (ref 0.5–2.0)

## 2014-08-04 MED ORDER — SODIUM CHLORIDE 0.9 % IV BOLUS (SEPSIS)
1000.0000 mL | Freq: Once | INTRAVENOUS | Status: AC
  Administered 2014-08-04: 1000 mL via INTRAVENOUS

## 2014-08-04 MED ORDER — BENZONATATE 100 MG PO CAPS: 100.0000 mg | ORAL_CAPSULE | Freq: Three times a day (TID) | ORAL | Status: DC | PRN

## 2014-08-04 MED ORDER — LEVOFLOXACIN 750 MG PO TABS
750.0000 mg | ORAL_TABLET | Freq: Once | ORAL | Status: AC
  Administered 2014-08-04: 750 mg via ORAL
  Filled 2014-08-04: qty 1

## 2014-08-04 MED ORDER — ONDANSETRON HCL 4 MG/2ML IJ SOLN
4.0000 mg | Freq: Once | INTRAMUSCULAR | Status: AC
  Administered 2014-08-04: 4 mg via INTRAVENOUS
  Filled 2014-08-04: qty 2

## 2014-08-04 MED ORDER — ACETAMINOPHEN 500 MG PO TABS
1000.0000 mg | ORAL_TABLET | Freq: Once | ORAL | Status: AC
  Administered 2014-08-04: 1000 mg via ORAL
  Filled 2014-08-04: qty 2

## 2014-08-04 MED ORDER — LEVOFLOXACIN 750 MG PO TABS: 750.0000 mg | ORAL_TABLET | Freq: Every day | ORAL | Status: DC

## 2014-08-04 NOTE — ED Notes (Signed)
EMS reports pt c/o chills x 2 days, frequent headaches, and intermittent SOB.  EMS says pt's husband passed away 3 weeks ago and family has noticed these symptoms in pt since then.  CBG was 92.  Pt reports nausea, no vomiting.

## 2014-08-04 NOTE — Discharge Instructions (Signed)

## 2014-08-04 NOTE — ED Notes (Signed)
Pt ambulated around entire unit.  C/o slight dizziness upon rising but pt able to without assistance.

## 2014-08-04 NOTE — ED Provider Notes (Signed)
TIME SEEN: 9:37 AM  CHIEF COMPLAINT: multiple complaints  HPI: HPI Comments: Robin [name] is a 74 y.o. female with history of hypertension, hyperlipidemia who is brought in by ambulance, who presents to the Emergency Department complaining of constant, intermittent chillls that began 2 days ago. She reports associated light-headed dizziness, a frontal headache, intermittent SOB, dry cough, decreased appetite, and nausea. She notes she has headaches at baseline that followed a fall in March which she was evaluated for. Pt is followed by a nuerologist, noting she has an appt tomorrow in [facility]. She takes tylenol for her headaches at home with mild to no relief. Pt's husband passed away 3 weeks ago per son. Son reports a subjective fever this morning. She did not take any anti-Peridex prior to arrival. Denies any numbness or tingling in extremities, focal weakness, CP, or abdominal pain. Additionally denies any recent head injury, tick bites, or anti-coagulation medication. Patient's son thinks that her symptoms are due to the recent death of her husband 3 weeks ago.    PCP is Dr. Ronnald Ramp in [place].   ROS: See HPI Constitutional: Subjective fever  Eyes: no drainage  ENT: no runny nose   Cardiovascular:  no chest pain  Resp: SOB  GI: no vomiting GU: no dysuria Integumentary: no rash  Allergy: no hives  Musculoskeletal: no leg swelling  Neurological: no slurred speech ROS otherwise negative  PAST MEDICAL HISTORY/PAST SURGICAL HISTORY:  Past Medical History  Diagnosis Date  . Reflux   . Hypercholesteremia   . Arthritis   . Hypertension     MEDICATIONS:  Prior to Admission medications   Medication Sig Start Date End Date Taking? Authorizing Provider  cholecalciferol (VITAMIN D) 1000 UNITS tablet Take 1,000 Units by mouth daily.    Historical Provider, MD  esomeprazole (NEXIUM) 40 MG capsule Take 40 mg by mouth daily before breakfast.    Historical Provider, MD  loratadine  (CLARITIN) 10 MG tablet Take 10 mg by mouth daily as needed for allergies.    Historical Provider, MD  meloxicam (MOBIC) 7.5 MG tablet Take 7.5 mg by mouth daily.    Historical Provider, MD  niacin (NIASPAN) 500 MG CR tablet Take 1,000 mg by mouth daily. 11/11/12   Historical Provider, MD    ALLERGIES:  Allergies  Allergen Reactions  . Dilaudid [Hydromorphone Hcl] Nausea And Vomiting  . Valium [Diazepam] Other (See Comments)    Family states patient has adverse reaction and displays confusion.     SOCIAL HISTORY:  History  Substance Use Topics  . Smoking status: Former Research scientist (life sciences)  . Smokeless tobacco: Not on file  . Alcohol Use: No    FAMILY HISTORY: No family history on file.  EXAM: BP 179/86 mmHg  Pulse 86  Temp(Src) 98.2 F (36.8 C) (Oral)  Resp 16  SpO2 97% CONSTITUTIONAL: Alert and oriented and responds appropriately to questions.  well-nourished; elderly and tearful but non toxic appearing, afebrile HEAD: Normocephalic EYES: Conjunctivae clear, PERRL; EOMI ENT: normal nose; no rhinorrhea; moist mucous membranes; pharynx without lesions noted NECK: Supple, no meningismus, no LAD  CARD: RRR; S1 and S2 appreciated; no murmurs, no clicks, no rubs, no gallops RESP: Normal chest excursion without splinting or tachypnea; breath sounds clear and equal bilaterally; no wheezes, no rhonchi, no rales, no hypoxia or respiratory distress, speaking full sentences ABD/GI: Normal bowel sounds; non-distended; soft, non-tender, no rebound, no guarding, no peritoneal signs BACK:  The back appears normal and is non-tender to palpation, there is no CVA  tenderness EXT: Normal ROM in all joints; non-tender to palpation; no edema; normal capillary refill; no cyanosis, no calf tenderness or swelling    SKIN: Normal color for age and race; warm; no rash NEURO: Moves all extremities equally, sensation to light touch intact diffusely, cranial nerves II through XII intact PSYCH: The patient's mood and  manner are appropriate. Grooming and personal hygiene are appropriate.  MEDICAL DECISION MAKING: Patient here with multiple complaints. May be secondary to grief reaction from recent death of her husband. We'll obtain labs and urine. Given she is complaining of shortness of breath, cough obtain troponin, chest x-ray. EKG shows no ischemic changes. Also reports head injury in March leading to frequent headaches. We'll repeat CT of her head today and give Tylenol. We'll also give IV fluids, Zofran.  ED PROGRESS: Patient's labs show mild leukocytosis with left shift. Otherwise they're unremarkable. Lactate normal. Troponin negative. Urine shows no sign of infection. Head CT shows no acute alveolar pain has improved with Tylenol and IV fluids. Nausea has also improved. Chest x-ray shows a probable right upper lobe infiltrate which is likely the cause of her cough and shortness of breath. Have offered admission but patient declined stating she would like to go home. She is afebrile in the ED, nontoxic appearing, hemodynamically stable, not hypoxic. Has no respiratory distress. Family is comfortable with plan to take her home. They will watch her closely. I have discussed this with her daughters and son. Given first dose of Levaquin in the ED. She has been able to ambulate with assistance and has a walker at home. Discussed strict return precautions. They all verbalized understanding and are comfortable with plan.    EKG Interpretation  Date/Time:  Sunday August 04 2014 09:30:46 EDT Ventricular Rate:  83 PR Interval:  109 QRS Duration: 85 QT Interval:  367 QTC Calculation: 431 R Axis:   76 Text Interpretation:  Sinus rhythm Short PR interval Minimal ST depression, inferior leads Confirmed by Jamichael Knotts,  DO, Remonia Otte [id number]) on 08/04/2014 9:48:42 AM         I personally performed the services described in this documentation, which was scribed in my presence. The recorded information has been reviewed and is  accurate.    [place], DO 08/04/14 1555

## 2014-09-22 ENCOUNTER — Emergency Department (HOSPITAL_COMMUNITY): Payer: Medicare Other

## 2014-09-22 ENCOUNTER — Encounter (HOSPITAL_COMMUNITY): Payer: Self-pay | Admitting: *Deleted

## 2014-09-22 ENCOUNTER — Emergency Department (HOSPITAL_COMMUNITY)
Admission: EM | Admit: 2014-09-22 | Discharge: 2014-09-23 | Disposition: A | Discharge status: Home / Self Care | Payer: Medicare Other | Attending: Emergency Medicine | Admitting: Emergency Medicine

## 2014-09-22 DIAGNOSIS — W010XXA Fall on same level from slipping, tripping and stumbling without subsequent striking against object, initial encounter: Secondary | ICD-10-CM | POA: Diagnosis not present

## 2014-09-22 DIAGNOSIS — S199XXA Unspecified injury of neck, initial encounter: Secondary | ICD-10-CM | POA: Insufficient documentation

## 2014-09-22 DIAGNOSIS — E780 Pure hypercholesterolemia: Secondary | ICD-10-CM | POA: Insufficient documentation

## 2014-09-22 DIAGNOSIS — Y998 Other external cause status: Secondary | ICD-10-CM | POA: Diagnosis not present

## 2014-09-22 DIAGNOSIS — Y9301 Activity, walking, marching and hiking: Secondary | ICD-10-CM | POA: Insufficient documentation

## 2014-09-22 DIAGNOSIS — Z792 Long term (current) use of antibiotics: Secondary | ICD-10-CM | POA: Diagnosis not present

## 2014-09-22 DIAGNOSIS — I10 Essential (primary) hypertension: Secondary | ICD-10-CM | POA: Diagnosis not present

## 2014-09-22 DIAGNOSIS — Z8701 Personal history of pneumonia (recurrent): Secondary | ICD-10-CM | POA: Insufficient documentation

## 2014-09-22 DIAGNOSIS — Y92009 Unspecified place in unspecified non-institutional (private) residence as the place of occurrence of the external cause: Secondary | ICD-10-CM | POA: Insufficient documentation

## 2014-09-22 DIAGNOSIS — Z79899 Other long term (current) drug therapy: Secondary | ICD-10-CM | POA: Diagnosis not present

## 2014-09-22 DIAGNOSIS — S0990XA Unspecified injury of head, initial encounter: Secondary | ICD-10-CM | POA: Diagnosis present

## 2014-09-22 DIAGNOSIS — W19XXXA Unspecified fall, initial encounter: Secondary | ICD-10-CM

## 2014-09-22 DIAGNOSIS — M1990 Unspecified osteoarthritis, unspecified site: Secondary | ICD-10-CM | POA: Diagnosis not present

## 2014-09-22 DIAGNOSIS — K219 Gastro-esophageal reflux disease without esophagitis: Secondary | ICD-10-CM | POA: Diagnosis not present

## 2014-09-22 DIAGNOSIS — Z791 Long term (current) use of non-steroidal anti-inflammatories (NSAID): Secondary | ICD-10-CM | POA: Insufficient documentation

## 2014-09-22 DIAGNOSIS — S3992XA Unspecified injury of lower back, initial encounter: Secondary | ICD-10-CM | POA: Diagnosis not present

## 2014-09-22 HISTORY — DX: Pneumonia, unspecified organism: J18.9

## 2014-09-22 MED ORDER — HYDROCODONE-ACETAMINOPHEN 5-325 MG PO TABS
1.0000 | ORAL_TABLET | Freq: Once | ORAL | Status: AC
  Administered 2014-09-22: 1 via ORAL
  Filled 2014-09-22: qty 1

## 2014-09-22 NOTE — ED Notes (Addendum)
Pt fell at home in hallway that has a slight incline per pt, pt states that she was wearing bedroom slippers and that they had slipped, reported that pt hit back of head and pt denies loc, reported she landed on bottom and pt with pain

## 2014-09-22 NOTE — ED Provider Notes (Signed)
History  This chart was scribed for Daleen Bo, MD by Marlowe Kays, ED Scribe. This patient was seen in room APA06/APA06 and the patient's care was started at 10:35 PM.  Chief Complaint  Patient presents with  . Fall   The history is provided by the patient and medical records. No language interpreter was used.    HPI Comments:  Robin [name] is a 74 y.o. female, brought in by EMS, who presents to the Emergency Department complaining of an witnessed fall approximately two hours ago. Pt states she had on bedroom slippers and was walking in a hallway in her home that has a slight incline to it causing her to slip and fall, landing on her buttocks, back and posterior head. Family member reports she "was in a daze" for a couple of minutes. She was then able to slide on the floor back to her chair. She has been ambulatory without assistance when EMS arrived. She reports severe lower back pain and neck pain. Touching the areas make the pain worse. She denies alleviating factors. She has not taken anything for pain PTA. She denies LOC, numbness, tingling or weakness of any extremity, nausea or vomiting. 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  Past Medical History  Diagnosis Date  . Reflux   . Hypercholesteremia   . Arthritis   . Hypertension   . Pneumonia    Past Surgical History  Procedure Laterality Date  . Total hip arthroplasty    . Tubal ligation    . Appendectomy     History reviewed. No pertinent family history. Social History  Substance Use Topics  . Smoking status: Former Research scientist (life sciences)  . Smokeless tobacco: None  . Alcohol Use: No   OB History    No data available     Review of Systems  Neurological: Negative for syncope.  All other systems reviewed and are negative.   Allergies  Dilaudid and Valium  Home Medications   Prior to Admission medications   Medication Sig Start Date End Date Taking? Authorizing Provider  acetaminophen (TYLENOL) 500 MG  tablet Take 500 mg by mouth every 6 (six) hours as needed for mild pain.   Yes Historical Provider, MD  ALPRAZolam Duanne Moron) 0.5 MG tablet Take 0.25 mg by mouth at bedtime. 06/06/14  Yes Historical Provider, MD  atorvastatin (LIPITOR) 40 MG tablet Take 40 mg by mouth at bedtime. 07/16/14  Yes Historical Provider, MD  cetirizine (ZYRTEC) 10 MG tablet Take 10 mg by mouth daily.   Yes Historical Provider, MD  cholecalciferol (VITAMIN D) 1000 UNITS tablet Take 1,000 Units by mouth daily.   Yes Historical Provider, MD  esomeprazole (NEXIUM) 40 MG capsule Take 40 mg by mouth daily before breakfast.   Yes Historical Provider, MD  meloxicam (MOBIC) 7.5 MG tablet Take 7.5 mg by mouth daily.   Yes Historical Provider, MD  benzonatate (TESSALON) 100 MG capsule Take 1 capsule (100 mg total) by mouth 3 (three) times daily as needed for cough. 08/04/14   Kristen N Ward, DO  levofloxacin (LEVAQUIN) 750 MG tablet Take 1 tablet (750 mg total) by mouth daily. 08/04/14   Kristen N Ward, DO   Triage Vitals: BP 179/69 mmHg  Pulse 64  Temp(Src) 98.7 F (37.1 C) (Oral)  Resp 18  Ht 5\' 2"  (1.575 m)  Wt 120 lb (54.432 kg)  BMI 21.94 kg/m2  SpO2 100% Physical Exam  Constitutional: She is oriented to person, place, and time. She appears well-developed and  well-nourished.  HENT:  Head: Normocephalic and atraumatic.  Eyes: Conjunctivae and EOM are normal. Pupils are equal, round, and reactive to light.  Neck: Normal range of motion and phonation normal. Neck supple.  Cardiovascular: Normal rate and regular rhythm.   Pulmonary/Chest: Effort normal and breath sounds normal. She exhibits no tenderness.  Abdominal: Soft. She exhibits no distension. There is no tenderness. There is no guarding.  Musculoskeletal: Normal range of motion.  Neurological: She is alert and oriented to person, place, and time. She exhibits normal muscle tone.  Skin: Skin is warm and dry.  Psychiatric: She has a normal mood and affect. Her behavior  is normal. Judgment and thought content normal.  Nursing note and vitals reviewed.   ED Course  Procedures (including critical care time) DIAGNOSTIC STUDIES: Oxygen Saturation is 100% on RA, normal by my interpretation.   COORDINATION OF CARE: 10:42 PM- Will order imaging and pain medication. Pt verbalizes understanding and agrees to plan.  Medications  HYDROcodone-acetaminophen (NORCO/VICODIN) 5-325 MG per tablet 1 tablet (1 tablet Oral Given 09/22/14 2303)   Radiologic imaging report reviewed and images by Radiograph and CT  - viewed, by me.  Labs Review Labs Reviewed - No data to display  Imaging Review No results found.   EKG Interpretation None      MDM   Final diagnoses:  Fall, initial encounter  Head injury, initial encounter  Neck injury, initial encounter  Back injury, initial encounter   Mechanical fall with contusions, imaging ordered to evaluate for serious injuries.  I personally performed the services described in this documentation, which was scribed in my presence. The recorded information has been reviewed and is accurate.  Care to Dr. Roxanne Mins to evaluate after return of imaging results  Daleen Bo, MD 09/25/14 1315

## 2014-09-23 MED ORDER — HYDROCODONE-ACETAMINOPHEN 5-325 MG PO TABS: 1.0000 | ORAL_TABLET | ORAL | Status: DC | PRN

## 2014-09-23 NOTE — Discharge Instructions (Signed)
Cervical Sprain °A cervical sprain is an injury in the neck in which the strong, fibrous tissues (ligaments) that connect your neck bones stretch or tear. Cervical sprains can range from mild to severe. Severe cervical sprains can cause the neck vertebrae to be unstable. This can lead to damage of the spinal cord and can result in serious nervous system problems. The amount of time it takes for a cervical sprain to get better depends on the cause and extent of the injury. Most cervical sprains heal in 1 to 3 weeks. °CAUSES  °Severe cervical sprains may be caused by:  °· Contact sport injuries (such as from football, rugby, wrestling, hockey, auto racing, gymnastics, diving, martial arts, or boxing).   °· Motor vehicle collisions.   °· Whiplash injuries. This is an injury from a sudden forward and backward whipping movement of the head and neck.  °· Falls.   °Mild cervical sprains may be caused by:  °· Being in an awkward position, such as while cradling a telephone between your ear and shoulder.   °· Sitting in a chair that does not offer proper support.   °· Working at a poorly designed computer station.   °· Looking up or down for long periods of time.   °SYMPTOMS  °· Pain, soreness, stiffness, or a burning sensation in the front, back, or sides of the neck. This discomfort may develop immediately after the injury or slowly, 24 hours or more after the injury.   °· Pain or tenderness directly in the middle of the back of the neck.   °· Shoulder or upper back pain.   °· Limited ability to move the neck.   °· Headache.   °· Dizziness.   °· Weakness, numbness, or tingling in the hands or arms.   °· Muscle spasms.   °· Difficulty swallowing or chewing.   °· Tenderness and swelling of the neck.   °DIAGNOSIS  °Most of the time your health care provider can diagnose a cervical sprain by taking your history and doing a physical exam. Your health care provider will ask about previous neck injuries and any known neck  problems, such as arthritis in the neck. X-rays may be taken to find out if there are any other problems, such as with the bones of the neck. Other tests, such as a CT scan or MRI, may also be needed.  °TREATMENT  °Treatment depends on the severity of the cervical sprain. Mild sprains can be treated with rest, keeping the neck in place (immobilization), and pain medicines. Severe cervical sprains are immediately immobilized. Further treatment is done to help with pain, muscle spasms, and other symptoms and may include: °· Medicines, such as pain relievers, numbing medicines, or muscle relaxants.   °· Physical therapy. This may involve stretching exercises, strengthening exercises, and posture training. Exercises and improved posture can help stabilize the neck, strengthen muscles, and help stop symptoms from returning.   °HOME CARE INSTRUCTIONS  °· Put ice on the injured area.   °¨ Put ice in a plastic bag.   °¨ Place a towel between your skin and the bag.   °¨ Leave the ice on for 15-20 minutes, 3-4 times a day.   °· If your injury was severe, you may have been given a cervical collar to wear. A cervical collar is a two-piece collar designed to keep your neck from moving while it heals. °¨ Do not remove the collar unless instructed by your health care provider. °¨ If you have long hair, keep it outside of the collar. °¨ Ask your health care provider before making any adjustments to your collar. Minor   adjustments may be required over time to improve comfort and reduce pressure on your chin or on the back of your head.  Ifyou are allowed to remove the collar for cleaning or bathing, follow your health care provider's instructions on how to do so safely.  Keep your collar clean by wiping it with mild soap and water and drying it completely. If the collar you have been given includes removable pads, remove them every 1-2 days and hand wash them with soap and water. Allow them to air dry. They should be completely  dry before you wear them in the collar.  If you are allowed to remove the collar for cleaning and bathing, wash and dry the skin of your neck. Check your skin for irritation or sores. If you see any, tell your health care provider.  Do not drive while wearing the collar.   Only take over-the-counter or prescription medicines for pain, discomfort, or fever as directed by your health care provider.   Keep all follow-up appointments as directed by your health care provider.   Keep all physical therapy appointments as directed by your health care provider.   Make any needed adjustments to your workstation to promote good posture.   Avoid positions and activities that make your symptoms worse.   Warm up and stretch before being active to help prevent problems.  SEEK MEDICAL CARE IF:   Your pain is not controlled with medicine.   You are unable to decrease your pain medicine over time as planned.   Your activity level is not improving as expected.  SEEK IMMEDIATE MEDICAL CARE IF:   You develop any bleeding.  You develop stomach upset.  You have signs of an allergic reaction to your medicine.   Your symptoms get worse.   You develop new, unexplained symptoms.   You have numbness, tingling, weakness, or paralysis in any part of your body.  MAKE SURE YOU:   Understand these instructions.  Will watch your condition.  Will get help right away if you are not doing well or get worse. Document Released: 11/22/2006 Document Revised: 01/30/2013 Document Reviewed: 08/02/2012 [facility] Patient Information 2015 [place], [place]. This information is not intended to replace advice given to you by your health care provider. Make sure you discuss any questions you have with your health care provider.  Contusion A contusion is a deep bruise. Contusions are the result of an injury that caused bleeding under the skin. The contusion may turn blue, purple, or yellow. Minor injuries  will give you a painless contusion, but more severe contusions may stay painful and swollen for a few weeks.  CAUSES  A contusion is usually caused by a blow, trauma, or direct force to an area of the body. SYMPTOMS   Swelling and redness of the injured area.  Bruising of the injured area.  Tenderness and soreness of the injured area.  Pain. DIAGNOSIS  The diagnosis can be made by taking a history and physical exam. An X-ray, CT scan, or MRI may be needed to determine if there were any associated injuries, such as fractures. TREATMENT  Specific treatment will depend on what area of the body was injured. In general, the best treatment for a contusion is resting, icing, elevating, and applying cold compresses to the injured area. Over-the-counter medicines may also be recommended for pain control. Ask your caregiver what the best treatment is for your contusion. HOME CARE INSTRUCTIONS   Put ice on the injured area.  Put ice  in a plastic bag.  Place a towel between your skin and the bag.  Leave the ice on for 15-20 minutes, 3-4 times a day, or as directed by your health care provider.  Only take over-the-counter or prescription medicines for pain, discomfort, or fever as directed by your caregiver. Your caregiver may recommend avoiding anti-inflammatory medicines (aspirin, ibuprofen, and naproxen) for 48 hours because these medicines may increase bruising.  Rest the injured area.  If possible, elevate the injured area to reduce swelling. SEEK IMMEDIATE MEDICAL CARE IF:   You have increased bruising or swelling.  You have pain that is getting worse.  Your swelling or pain is not relieved with medicines. MAKE SURE YOU:   Understand these instructions.  Will watch your condition.  Will get help right away if you are not doing well or get worse. Document Released: 11/04/2004 Document Revised: 01/30/2013 Document Reviewed: 11/30/2010 [facility] Patient Information 2015 [place],  [place]. This information is not intended to replace advice given to you by your health care provider. Make sure you discuss any questions you have with your health care provider.  Head Injury You have received a head injury. It does not appear serious at this time. Headaches and vomiting are common following head injury. It should be easy to awaken from sleeping. Sometimes it is necessary for you to stay in the emergency department for a while for observation. Sometimes admission to the hospital may be needed. After injuries such as yours, most problems occur within the first 24 hours, but side effects may occur up to 7-10 days after the injury. It is important for you to carefully monitor your condition and contact your health care provider or seek immediate medical care if there is a change in your condition. WHAT ARE THE TYPES OF HEAD INJURIES? Head injuries can be as minor as a bump. Some head injuries can be more severe. More severe head injuries include:  A jarring injury to the brain (concussion).  A bruise of the brain (contusion). This mean there is bleeding in the brain that can cause swelling.  A cracked skull (skull fracture).  Bleeding in the brain that collects, clots, and forms a bump (hematoma). WHAT CAUSES A HEAD INJURY? A serious head injury is most likely to happen to someone who is in a car wreck and is not wearing a seat belt. Other causes of major head injuries include bicycle or motorcycle accidents, sports injuries, and falls. HOW ARE HEAD INJURIES DIAGNOSED? A complete history of the event leading to the injury and your current symptoms will be helpful in diagnosing head injuries. Many times, pictures of the brain, such as CT or MRI are needed to see the extent of the injury. Often, an overnight hospital stay is necessary for observation.  WHEN SHOULD I SEEK IMMEDIATE MEDICAL CARE?  You should get help right away if:  You have confusion or drowsiness.  You feel sick to  your stomach (nauseous) or have continued, forceful vomiting.  You have dizziness or unsteadiness that is getting worse.  You have severe, continued headaches not relieved by medicine. Only take over-the-counter or prescription medicines for pain, fever, or discomfort as directed by your health care provider.  You do not have normal function of the arms or legs or are unable to walk.  You notice changes in the black spots in the center of the colored part of your eye (pupil).  You have a clear or bloody fluid coming from your nose or ears.  You have a loss of vision. During the next 24 hours after the injury, you must stay with someone who can watch you for the warning signs. This person should contact local emergency services (911 in the [place] if you have seizures, you become unconscious, or you are unable to wake up. HOW CAN I PREVENT A HEAD INJURY IN THE FUTURE? The most important factor for preventing major head injuries is avoiding motor vehicle accidents. To minimize the potential for damage to your head, it is crucial to wear seat belts while riding in motor vehicles. Wearing helmets while bike riding and playing collision sports (like football) is also helpful. Also, avoiding dangerous activities around the house will further help reduce your risk of head injury.  WHEN CAN I RETURN TO NORMAL ACTIVITIES AND ATHLETICS? You should be reevaluated by your health care provider before returning to these activities. If you have any of the following symptoms, you should not return to activities or contact sports until 1 week after the symptoms have stopped:  Persistent headache.  Dizziness or vertigo.  Poor attention and concentration.  Confusion.  Memory problems.  Nausea or vomiting.  Fatigue or tire easily.  Irritability.  Intolerant of bright lights or loud noises.  Anxiety or depression.  Disturbed sleep. MAKE SURE YOU:   Understand these instructions.  Will watch  your condition.  Will get help right away if you are not doing well or get worse. Document Released: 01/25/2005 Document Revised: 01/30/2013 Document Reviewed: 10/02/2012 [facility] Patient Information 2015 [place], [place]. This information is not intended to replace advice given to you by your health care provider. Make sure you discuss any questions you have with your health care provider.

## 2015-03-26 ENCOUNTER — Encounter: Payer: Self-pay | Admitting: Physician Assistant

## 2015-03-26 DIAGNOSIS — E785 Hyperlipidemia, unspecified: Secondary | ICD-10-CM | POA: Diagnosis not present

## 2015-03-26 DIAGNOSIS — E538 Deficiency of other specified B group vitamins: Secondary | ICD-10-CM | POA: Diagnosis not present

## 2015-03-26 DIAGNOSIS — M1990 Unspecified osteoarthritis, unspecified site: Secondary | ICD-10-CM | POA: Diagnosis not present

## 2015-03-26 DIAGNOSIS — Z0000 Encounter for general adult medical examination without abnormal findings: Secondary | ICD-10-CM | POA: Diagnosis not present

## 2015-03-26 DIAGNOSIS — K219 Gastro-esophageal reflux disease without esophagitis: Secondary | ICD-10-CM | POA: Diagnosis not present

## 2015-05-02 DIAGNOSIS — K219 Gastro-esophageal reflux disease without esophagitis: Secondary | ICD-10-CM | POA: Diagnosis not present

## 2015-05-02 DIAGNOSIS — M25532 Pain in left wrist: Secondary | ICD-10-CM | POA: Diagnosis not present

## 2015-05-02 DIAGNOSIS — S52502A Unspecified fracture of the lower end of left radius, initial encounter for closed fracture: Secondary | ICD-10-CM | POA: Diagnosis not present

## 2015-05-02 DIAGNOSIS — Z96641 Presence of right artificial hip joint: Secondary | ICD-10-CM | POA: Diagnosis not present

## 2015-05-02 DIAGNOSIS — M1990 Unspecified osteoarthritis, unspecified site: Secondary | ICD-10-CM | POA: Diagnosis not present

## 2015-05-02 DIAGNOSIS — Z7982 Long term (current) use of aspirin: Secondary | ICD-10-CM | POA: Diagnosis not present

## 2015-05-02 DIAGNOSIS — Z791 Long term (current) use of non-steroidal anti-inflammatories (NSAID): Secondary | ICD-10-CM | POA: Diagnosis not present

## 2015-05-02 DIAGNOSIS — Z885 Allergy status to narcotic agent status: Secondary | ICD-10-CM | POA: Diagnosis not present

## 2015-05-02 DIAGNOSIS — Z86718 Personal history of other venous thrombosis and embolism: Secondary | ICD-10-CM | POA: Diagnosis not present

## 2015-05-02 DIAGNOSIS — M25561 Pain in right knee: Secondary | ICD-10-CM | POA: Diagnosis not present

## 2015-05-02 DIAGNOSIS — Z79899 Other long term (current) drug therapy: Secondary | ICD-10-CM | POA: Diagnosis not present

## 2015-05-02 DIAGNOSIS — S52572A Other intraarticular fracture of lower end of left radius, initial encounter for closed fracture: Secondary | ICD-10-CM | POA: Diagnosis not present

## 2015-05-02 DIAGNOSIS — Z888 Allergy status to other drugs, medicaments and biological substances status: Secondary | ICD-10-CM | POA: Diagnosis not present

## 2015-05-02 DIAGNOSIS — F329 Major depressive disorder, single episode, unspecified: Secondary | ICD-10-CM | POA: Diagnosis not present

## 2015-05-02 DIAGNOSIS — W108XXA Fall (on) (from) other stairs and steps, initial encounter: Secondary | ICD-10-CM | POA: Diagnosis not present

## 2015-05-02 DIAGNOSIS — E785 Hyperlipidemia, unspecified: Secondary | ICD-10-CM | POA: Diagnosis not present

## 2015-05-02 DIAGNOSIS — S8991XA Unspecified injury of right lower leg, initial encounter: Secondary | ICD-10-CM | POA: Diagnosis not present

## 2015-05-02 DIAGNOSIS — Z87891 Personal history of nicotine dependence: Secondary | ICD-10-CM | POA: Diagnosis not present

## 2015-05-05 DIAGNOSIS — M25532 Pain in left wrist: Secondary | ICD-10-CM | POA: Diagnosis not present

## 2015-05-07 DIAGNOSIS — M25532 Pain in left wrist: Secondary | ICD-10-CM | POA: Diagnosis not present

## 2015-05-08 ENCOUNTER — Encounter (HOSPITAL_COMMUNITY): Payer: Self-pay | Admitting: *Deleted

## 2015-05-08 ENCOUNTER — Emergency Department (HOSPITAL_COMMUNITY)
Admission: EM | Admit: 2015-05-08 | Discharge: 2015-05-08 | Disposition: A | Discharge status: Home / Self Care | Payer: Medicare Other | Attending: Emergency Medicine | Admitting: Emergency Medicine

## 2015-05-08 DIAGNOSIS — Z79899 Other long term (current) drug therapy: Secondary | ICD-10-CM | POA: Insufficient documentation

## 2015-05-08 DIAGNOSIS — Z6820 Body mass index (BMI) 20.0-20.9, adult: Secondary | ICD-10-CM | POA: Diagnosis not present

## 2015-05-08 DIAGNOSIS — Z87891 Personal history of nicotine dependence: Secondary | ICD-10-CM | POA: Diagnosis not present

## 2015-05-08 DIAGNOSIS — I10 Essential (primary) hypertension: Secondary | ICD-10-CM | POA: Diagnosis not present

## 2015-05-08 DIAGNOSIS — Z7982 Long term (current) use of aspirin: Secondary | ICD-10-CM | POA: Insufficient documentation

## 2015-05-08 DIAGNOSIS — R519 Headache, unspecified: Secondary | ICD-10-CM

## 2015-05-08 DIAGNOSIS — E7800 Pure hypercholesterolemia, unspecified: Secondary | ICD-10-CM | POA: Diagnosis not present

## 2015-05-08 DIAGNOSIS — J329 Chronic sinusitis, unspecified: Secondary | ICD-10-CM | POA: Diagnosis not present

## 2015-05-08 DIAGNOSIS — R030 Elevated blood-pressure reading, without diagnosis of hypertension: Secondary | ICD-10-CM | POA: Diagnosis not present

## 2015-05-08 DIAGNOSIS — R51 Headache: Secondary | ICD-10-CM

## 2015-05-08 DIAGNOSIS — M96639 Fracture of radius or ulna following insertion of orthopedic implant, joint prosthesis, or bone plate, unspecified arm: Secondary | ICD-10-CM | POA: Diagnosis not present

## 2015-05-08 LAB — CBG MONITORING, ED: GLUCOSE-CAPILLARY: 88 mg/dL (ref 65–99)

## 2015-05-08 MED ORDER — LABETALOL HCL 5 MG/ML IV SOLN
10.0000 mg | Freq: Once | INTRAVENOUS | Status: AC
  Administered 2015-05-08: 10 mg via INTRAVENOUS
  Filled 2015-05-08: qty 4

## 2015-05-08 MED ORDER — ACETAMINOPHEN 325 MG PO TABS
650.0000 mg | ORAL_TABLET | Freq: Once | ORAL | Status: AC
  Administered 2015-05-08: 650 mg via ORAL
  Filled 2015-05-08: qty 2

## 2015-05-08 NOTE — Discharge Instructions (Signed)
Keep your appointment with Ms Ronnald Ramp at 10:30 this morning to discuss your blood pressure medications.

## 2015-05-08 NOTE — ED Notes (Signed)
Pt arrived to er via [facility] with c/o hypertension, pt reports that she does not take any medications for her blood pressure but that it has been running elevated when she goes to the pcp, yesterday she went to orthopadic and her blood pressure was over 190 and she has been having a headache today,

## 2015-05-08 NOTE — ED Notes (Signed)
Son requested cbg be checked prior to d/c.  Pt asymptomatic.  CBG 88

## 2015-05-08 NOTE — ED Provider Notes (Addendum)
CSN: [id number]     Arrival date & time 05/08/15  [id number] History   First MD Initiated Contact with Patient 05/08/15 [phone]     Chief Complaint  Patient presents with  . Hypertension     (Consider location/radiation/quality/duration/timing/severity/associated sxs/prior Treatment) HPI patient states  "My blood pressure spiked up".  She states she used to be on blood pressure medication however in 2012 she had a hip replacement done and she was never started back on her blood pressure medication. She states she was on lisinopril. She states whenever she goes to see her PCP the nurse comments that her blood pressure is a little bit high. She states yesterday she saw Dr. Percell Miller, orthopedist in [place], after a fall where she broke her left  wrist and he had talked to her about doing surgery on her wrist however her blood pressure was too high while she was in the office. She states it was 197/97. She has an appointment at 10:30 this morning at her PCP office to have her blood pressure evaluated. She states she had a mild headache yesterday while at the doctor's office. She states when she woke up about 4:30 this morning she has a sharp frontal headache and feeling dizzy. She denies chest pain, shortness of breath, blurred vision, numbness or tingling in her extremities, fever, but states she has had some rhinorrhea that sometimes is bloody and sneezing. She states when she took her blood pressure at home her blood pressure was high again. EMS was called. She states she is taking aspirin 81 mg a day otherwise she is not on any type of blood thinners.  PT is right handed  PCP Dr Ileene Rubens Dr Percell Miller  Past Medical History  Diagnosis Date  . Reflux   . Hypercholesteremia   . Arthritis   . Hypertension   . Pneumonia    Past Surgical History  Procedure Laterality Date  . Total hip arthroplasty    . Tubal ligation    . Appendectomy     No family history on file. Social History  Substance Use  Topics  . Smoking status: Former Research scientist (life sciences)  . Smokeless tobacco: None  . Alcohol Use: No   Daughter and GS live with her  OB History    No data available     Review of Systems  All other systems reviewed and are negative.     Allergies  Dilaudid and Valium  Home Medications   Prior to Admission medications   Medication Sig Start Date End Date Taking? Authorizing Provider  ALPRAZolam Duanne Moron) 0.5 MG tablet Take 0.5 mg by mouth at bedtime.  06/06/14  Yes Historical Provider, MD  aspirin 81 MG tablet Take 81 mg by mouth daily.   Yes Historical Provider, MD  atorvastatin (LIPITOR) 40 MG tablet Take 40 mg by mouth at bedtime. 07/16/14  Yes Historical Provider, MD  esomeprazole (NEXIUM) 40 MG capsule Take 40 mg by mouth daily before breakfast.   Yes Historical Provider, MD  HYDROcodone-acetaminophen (NORCO) 5-325 MG per tablet Take 1-2 tablets by mouth every 4 (four) hours as needed. 09/23/14  Yes Daleen Bo, MD  meloxicam (MOBIC) 7.5 MG tablet Take 7.5 mg by mouth daily.   Yes Historical Provider, MD  acetaminophen (TYLENOL) 500 MG tablet Take 500 mg by mouth every 6 (six) hours as needed for mild pain.    Historical Provider, MD  benzonatate (TESSALON) 100 MG capsule Take 1 capsule (100 mg total) by mouth 3 (three) times daily  as needed for cough. 08/04/14   Kristen N Ward, DO  cetirizine (ZYRTEC) 10 MG tablet Take 10 mg by mouth daily.    Historical Provider, MD  cholecalciferol (VITAMIN D) 1000 UNITS tablet Take 1,000 Units by mouth daily.    Historical Provider, MD  levofloxacin (LEVAQUIN) 750 MG tablet Take 1 tablet (750 mg total) by mouth daily. 08/04/14   Kristen N Ward, DO   BP 165/112 mmHg  Pulse 68  Temp(Src) 98 F (36.7 C) (Oral)  Resp 20  Ht 5\' 3"  (1.6 m)  Wt 110 lb (49.896 kg)  BMI 19.49 kg/m2  SpO2 99%  Vital signs normal except hypertension  Physical Exam  Constitutional: She is oriented to person, place, and time. She appears well-developed and well-nourished.   Non-toxic appearance. She does not appear ill. No distress.  HENT:  Head: Normocephalic and atraumatic.  Right Ear: External ear normal.  Left Ear: External ear normal.  Nose: Nose normal. No mucosal edema or rhinorrhea.  Mouth/Throat: Oropharynx is clear and moist and mucous membranes are normal. No dental abscesses or uvula swelling.  Eyes: Conjunctivae and EOM are normal. Pupils are equal, round, and reactive to light.  Neck: Normal range of motion and full passive range of motion without pain. Neck supple.  Cardiovascular: Normal rate, regular rhythm and normal heart sounds.  Exam reveals no gallop and no friction rub.   No murmur heard. Pulmonary/Chest: Effort normal and breath sounds normal. No respiratory distress. She has no wheezes. She has no rhonchi. She has no rales. She exhibits no tenderness and no crepitus.  Abdominal: Soft. Normal appearance and bowel sounds are normal. She exhibits no distension. There is no tenderness. There is no rebound and no guarding.  Musculoskeletal: Normal range of motion. She exhibits no edema or tenderness.  Moves all extremities well. Patient has a splint on her left upper extremity.  Neurological: She is alert and oriented to person, place, and time. She has normal strength. No cranial nerve deficit.  Skin: Skin is warm, dry and intact. No rash noted. No erythema. No pallor.  Psychiatric: She has a normal mood and affect. Her speech is normal and behavior is normal. Her mood appears not anxious.  Nursing note and vitals reviewed.   ED Course  Procedures (including critical care time)  Medications  acetaminophen (TYLENOL) tablet 650 mg (not administered)  labetalol (NORMODYNE,TRANDATE) injection 10 mg (not administered)  labetalol (NORMODYNE,TRANDATE) injection 10 mg (10 mg Intravenous Given 05/08/15 0704)   07:40 BP 163/75 still c/o headache, thinks acetaminophen will help her headache.   07:58 pt was given an additional labetalol.   Pt  was discharged to see her doctor at 10:30 AM this morning.   ED ECG REPORT   Date: 05/08/2015  Rate: 71  Rhythm: normal sinus rhythm  QRS Axis: normal  Intervals: normal  ST/T Wave abnormalities: normal  Conduction Disutrbances:LVH  Narrative Interpretation:   Old EKG Reviewed: none available  I have personally reviewed the EKG tracing and agree with the computerized printout as noted.    MDM   Final diagnoses:  Headache disorder  Essential hypertension    Plan discharge  Rolland Porter, MD, Barbette Or, MD 05/08/15 [place], MD 05/08/15 [phone]

## 2015-05-13 DIAGNOSIS — S6290XA Unspecified fracture of unspecified wrist and hand, initial encounter for closed fracture: Secondary | ICD-10-CM | POA: Diagnosis not present

## 2015-05-13 DIAGNOSIS — Z6821 Body mass index (BMI) 21.0-21.9, adult: Secondary | ICD-10-CM | POA: Diagnosis not present

## 2015-05-13 DIAGNOSIS — I10 Essential (primary) hypertension: Secondary | ICD-10-CM | POA: Diagnosis not present

## 2015-05-14 DIAGNOSIS — S52502A Unspecified fracture of the lower end of left radius, initial encounter for closed fracture: Secondary | ICD-10-CM | POA: Diagnosis not present

## 2015-05-14 DIAGNOSIS — M25532 Pain in left wrist: Secondary | ICD-10-CM | POA: Diagnosis not present

## 2015-05-19 DIAGNOSIS — L821 Other seborrheic keratosis: Secondary | ICD-10-CM | POA: Diagnosis not present

## 2015-05-19 DIAGNOSIS — L570 Actinic keratosis: Secondary | ICD-10-CM | POA: Diagnosis not present

## 2015-05-19 DIAGNOSIS — C44319 Basal cell carcinoma of skin of other parts of face: Secondary | ICD-10-CM | POA: Diagnosis not present

## 2015-05-19 DIAGNOSIS — D485 Neoplasm of uncertain behavior of skin: Secondary | ICD-10-CM | POA: Diagnosis not present

## 2015-06-11 DIAGNOSIS — S52502D Unspecified fracture of the lower end of left radius, subsequent encounter for closed fracture with routine healing: Secondary | ICD-10-CM | POA: Diagnosis not present

## 2015-06-19 DIAGNOSIS — C44319 Basal cell carcinoma of skin of other parts of face: Secondary | ICD-10-CM | POA: Diagnosis not present

## 2015-06-19 DIAGNOSIS — Z85828 Personal history of other malignant neoplasm of skin: Secondary | ICD-10-CM | POA: Diagnosis not present

## 2015-06-23 DIAGNOSIS — R531 Weakness: Secondary | ICD-10-CM | POA: Diagnosis not present

## 2015-06-23 DIAGNOSIS — R29898 Other symptoms and signs involving the musculoskeletal system: Secondary | ICD-10-CM | POA: Diagnosis not present

## 2015-06-23 DIAGNOSIS — M6281 Muscle weakness (generalized): Secondary | ICD-10-CM | POA: Diagnosis not present

## 2015-06-23 DIAGNOSIS — M1990 Unspecified osteoarthritis, unspecified site: Secondary | ICD-10-CM | POA: Diagnosis not present

## 2015-06-26 DIAGNOSIS — L570 Actinic keratosis: Secondary | ICD-10-CM | POA: Diagnosis not present

## 2015-07-09 DIAGNOSIS — M25532 Pain in left wrist: Secondary | ICD-10-CM | POA: Diagnosis not present

## 2015-07-16 ENCOUNTER — Encounter: Payer: Self-pay | Admitting: Physician Assistant

## 2015-07-16 DIAGNOSIS — R42 Dizziness and giddiness: Secondary | ICD-10-CM | POA: Diagnosis not present

## 2015-07-16 DIAGNOSIS — G4710 Hypersomnia, unspecified: Secondary | ICD-10-CM | POA: Diagnosis not present

## 2015-07-16 DIAGNOSIS — R531 Weakness: Secondary | ICD-10-CM | POA: Diagnosis not present

## 2015-07-16 DIAGNOSIS — R5383 Other fatigue: Secondary | ICD-10-CM | POA: Diagnosis not present

## 2015-07-16 DIAGNOSIS — R6883 Chills (without fever): Secondary | ICD-10-CM | POA: Diagnosis not present

## 2015-07-30 DIAGNOSIS — I10 Essential (primary) hypertension: Secondary | ICD-10-CM | POA: Diagnosis not present

## 2015-07-30 DIAGNOSIS — K5900 Constipation, unspecified: Secondary | ICD-10-CM | POA: Diagnosis not present

## 2015-07-30 DIAGNOSIS — N29 Other disorders of kidney and ureter in diseases classified elsewhere: Secondary | ICD-10-CM | POA: Diagnosis not present

## 2015-08-20 DIAGNOSIS — R300 Dysuria: Secondary | ICD-10-CM | POA: Diagnosis not present

## 2015-09-30 ENCOUNTER — Other Ambulatory Visit: Payer: Self-pay | Admitting: *Deleted

## 2015-09-30 MED ORDER — ALPRAZOLAM 0.5 MG PO TABS: 0.5000 mg | ORAL_TABLET | Freq: Every evening | ORAL | 0 refills | Status: DC | PRN

## 2015-09-30 NOTE — Telephone Encounter (Signed)
Last filled 06/19/2015. Please advise

## 2015-10-08 ENCOUNTER — Other Ambulatory Visit: Payer: Self-pay | Admitting: Physician Assistant

## 2015-11-17 ENCOUNTER — Ambulatory Visit (INDEPENDENT_AMBULATORY_CARE_PROVIDER_SITE_OTHER): Payer: Medicare Other | Admitting: Physician Assistant

## 2015-11-17 ENCOUNTER — Encounter: Payer: Self-pay | Admitting: Physician Assistant

## 2015-11-17 VITALS — BP 121/70 | HR 74 | Temp 98.2°F | Resp 16 | Ht 63.0 in | Wt 114.8 lb

## 2015-11-17 DIAGNOSIS — Z1211 Encounter for screening for malignant neoplasm of colon: Secondary | ICD-10-CM | POA: Diagnosis not present

## 2015-11-17 DIAGNOSIS — N183 Chronic kidney disease, stage 3 unspecified: Secondary | ICD-10-CM

## 2015-11-17 DIAGNOSIS — K219 Gastro-esophageal reflux disease without esophagitis: Secondary | ICD-10-CM

## 2015-11-17 DIAGNOSIS — M1612 Unilateral primary osteoarthritis, left hip: Secondary | ICD-10-CM

## 2015-11-17 DIAGNOSIS — E785 Hyperlipidemia, unspecified: Secondary | ICD-10-CM | POA: Diagnosis not present

## 2015-11-17 DIAGNOSIS — I10 Essential (primary) hypertension: Secondary | ICD-10-CM

## 2015-11-17 DIAGNOSIS — M810 Age-related osteoporosis without current pathological fracture: Secondary | ICD-10-CM

## 2015-11-17 MED ORDER — PREDNISONE 10 MG (21) PO TBPK: ORAL_TABLET | ORAL | 0 refills | Status: DC

## 2015-11-17 NOTE — Patient Instructions (Signed)
Hip Rehabilitation After Surgery Exercising your hip can greatly improve the results of your hip surgery. The exercises described here are designed to help you keep full movement of your hip joint. HOW SHOULD I EXERCISE MY HIP? The following exercises can be done on a training mat, on the floor, on a table, or on a bed. Use whatever works best and is most comfortable for you. Perform all exercises about fifteen times on each side, three times per day or as directed.  Lying on your back, slowly slide your foot toward your buttocks, raising your knee up off the floor. Then slowly slide your foot back down until your leg is straight again.  Lying on your back, spread your legs as far apart as you can without feeling discomfort.  Lying on your side, raise your leg straight up from the floor as far as is comfortable. Slowly lower the leg.  Lying on your back, tighten up the muscle in the front of your thigh (quadriceps). You can do this by keeping your leg straight and trying to raise your heel off the floor. This helps strengthen the largest muscle supporting your knee.  Lying on your back, tighten up the muscles of your buttocks both with the legs straight and with the knee bent at a comfortable angle while keeping your heel on the floor.  Lying on your stomach, lift your toes off the floor toward your buttocks. Bend your knee as far as is comfortable. Tighten the muscles in your buttocks while doing this. Follow all safety measures that are given to protect your hip. If any of these exercises cause increased pain or swelling in your joint, decrease the exercises until you are comfortable again. Then slowly increase the exercises. Call your health care provider if you have problems or questions.    This information is not intended to replace advice given to you by your health care provider. Make sure you discuss any questions you have with your health care provider.   Document Released: 08/29/2003  Document Revised: 02/15/2014 Document Reviewed: 04/26/2013 Elsevier Interactive Patient Education [employer].

## 2015-11-17 NOTE — Progress Notes (Signed)
BP 121/70   Pulse 74   Temp 98.2 F (36.8 C)   Resp 16   Ht '5\' 3"'$  (1.6 m)   Wt 114 lb 12.8 oz (52.1 kg)   SpO2 96%   BMI 20.34 kg/m    Subjective:    Patient ID: Robin [name], female    DOB: [birth year], 75 y.o.   MRN: [id number]  Robin [name] is a 75 y.o. female presenting on 11/17/2015 for Follow-up (Patient is here for a 3 mont follow up ); Joint Swelling; Back Pain; and Hip Pain  HPI Patient here to be established as new patient at [facility].  This patient is known to me from [facility]. This patient comes in today to be established. Overall she is doing well. Her main complaint is about increased pain in the right hip. She had this replaced in 2012 at [facility]. She states that the pain is increased when she has been standing for very long. It will be sore after she has sat for a while and it takes her a bit to get going. She is currently on Mobic 7.5 mg 1 daily. She uses Tylenol for breakthrough pain.   All of her medications are reviewed today. 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 She does not need any other refills at this time. She will have blood work performed today.  Relevant past medical, surgical, family and social history reviewed and updated as indicated. Interim medical history since our last visit reviewed. Allergies and medications reviewed and updated.   Data reviewed from any sources in EPIC.  Review of Systems  Constitutional: Negative.  Negative for activity change, fatigue and fever.  HENT: Negative.   Eyes: Negative.   Respiratory: Negative.  Negative for cough, chest tightness and wheezing.   Cardiovascular: Negative.  Negative for chest pain.  Gastrointestinal: Negative.  Negative for abdominal pain and constipation.  Endocrine: Negative.     Genitourinary: Negative.  Negative for dysuria.  Musculoskeletal: Positive for arthralgias, gait problem and myalgias. Negative for joint swelling.  Skin: Negative.     Per HPI unless specifically indicated above  Social History   Social History  . Marital status: Married    Spouse name: N/A  . Number of children: N/A  . Years of education: N/A   Occupational History  . Not on file.   Social History Main Topics  . Smoking status: Former Research scientist (life sciences)  . Smokeless tobacco: Never Used  . Alcohol use No  . Drug use: No  . Sexual activity: Not on file   Other Topics Concern  . Not on file   Social History Narrative  . No narrative on file    Past Surgical History:  Procedure Laterality Date  . APPENDECTOMY    . TOTAL HIP ARTHROPLASTY    . TUBAL LIGATION     2012 right total hip, at [facility] History reviewed. No pertinent family history.    Medication List       Accurate as of 11/17/15  9:14 AM. Always use your most recent med list.          acetaminophen 500 MG tablet Commonly known as:  TYLENOL Take 500 mg by mouth every 6 (six) hours as needed for mild pain.   ALPRAZolam 0.5 MG tablet Commonly known as:  XANAX Take 1 tablet (0.5 mg total)  by mouth at bedtime as needed for anxiety.   amLODipine 10 MG tablet Commonly known as:  NORVASC Take 10 mg by mouth daily.   aspirin 81 MG tablet Take 81 mg by mouth daily.   atorvastatin 40 MG tablet Commonly known as:  LIPITOR Take 40 mg by mouth at bedtime.   cetirizine 10 MG tablet Commonly known as:  ZYRTEC TAKE 1 TABLET DAILY AS NEEDED FOR ALLERGIES   cholecalciferol 1000 units tablet Commonly known as:  VITAMIN D Take 1,000 Units by mouth daily.   esomeprazole 40 MG capsule Commonly known as:  NEXIUM Take 40 mg by mouth daily before breakfast.   meloxicam 7.5 MG tablet Commonly known as:  MOBIC Take 7.5 mg by mouth daily.   predniSONE 10 MG (21) Tbpk tablet Commonly known as:  STERAPRED  UNI-PAK 21 TAB As directed x 6 days          Objective:    Ht 5\' 3"  (1.6 m)   Wt 114 lb 12.8 oz (52.1 kg)   BMI 20.34 kg/m   Allergies  Allergen Reactions  . Dilaudid [Hydromorphone Hcl] Nausea And Vomiting  . Valium [Diazepam] Other (See Comments)    Family states patient has adverse reaction and displays confusion.    Wt Readings from Last 3 Encounters:  11/17/15 114 lb 12.8 oz (52.1 kg)  05/08/15 110 lb (49.9 kg)  09/22/14 120 lb (54.4 kg)    Physical Exam  Constitutional: She is oriented to person, place, and time. She appears well-developed and well-nourished.  HENT:  Head: Normocephalic and atraumatic.  Eyes: Conjunctivae and EOM are normal. Pupils are equal, round, and reactive to light.  Cardiovascular: Normal rate, regular rhythm, normal heart sounds and intact distal pulses.   Pulmonary/Chest: Effort normal and breath sounds normal.  Abdominal: Soft. Bowel sounds are normal.  Musculoskeletal:       Right hip: She exhibits decreased range of motion and decreased strength. She exhibits no tenderness, no swelling, no crepitus and no deformity.       Legs: Neurological: She is alert and oriented to person, place, and time. She has normal reflexes.  Skin: Skin is warm and dry. No rash noted.  Psychiatric: She has a normal mood and affect. Her behavior is normal. Judgment and thought content normal.    Results for orders placed or performed during the hospital encounter of 05/08/15  CBG monitoring, ED  Result Value Ref Range   Glucose-Capillary 88 65 - 99 mg/dL      Assessment & Plan:   1. Hyperlipidemia, unspecified hyperlipidemia type - Lipid panel; Standing - Lipid panel  2. Essential hypertension - amLODipine (NORVASC) 10 MG tablet; Take 10 mg by mouth daily.  - CBC with Differential/Platelet; Standing - CBC with Differential/Platelet  3. Osteoarthritis of left hip, unspecified osteoarthritis type Referral to [facility] for evaluation of hip pain, s/p  replacement 2012 at [facility]. - predniSONE (STERAPRED UNI-PAK 21 TAB) 10 MG (21) TBPK tablet; As directed x 6 days  Dispense: 21 tablet; Refill: 0  4. Gastro-esophageal reflux disease without esophagitis Continue meds  5. Chronic kidney disease (CKD), stage III (moderate) - CBC with Differential/Platelet; Standing - CMP14+EGFR; Standing - CBC with Differential/Platelet - CMP14+EGFR   Continue all other maintenance medications as listed above. Educational handout given for hip pain  Follow up plan: Return in about 3 months (around 02/17/2016) for recheck and labs.  04/16/2016 PA-C [facility] [address]  [place], [place]  [phone]   11/17/2015, 9:14 AM

## 2015-11-18 LAB — LIPID PANEL
CHOLESTEROL TOTAL: 173 mg/dL (ref 100–199)
Chol/HDL Ratio: 2.2 ratio units (ref 0.0–4.4)
HDL: 78 mg/dL (ref >39)
LDL CALC: 81 mg/dL (ref 0–99)
Triglycerides: 70 mg/dL (ref 0–149)
VLDL CHOLESTEROL CAL: 14 mg/dL (ref 5–40)

## 2015-11-18 LAB — CMP14+EGFR
ALBUMIN: 4.2 g/dL (ref 3.5–4.8)
ALK PHOS: 133 IU/L — AB (ref 39–117)
ALT: 16 IU/L (ref 0–32)
AST: 22 IU/L (ref 0–40)
Albumin/Globulin Ratio: 1.8 (ref 1.2–2.2)
BILIRUBIN TOTAL: 0.4 mg/dL (ref 0.0–1.2)
BUN / CREAT RATIO: 16 (ref 12–28)
BUN: 23 mg/dL (ref 8–27)
CALCIUM: 9 mg/dL (ref 8.7–10.3)
CHLORIDE: 104 mmol/L (ref 96–106)
CO2: 24 mmol/L (ref 18–29)
Creatinine, Ser: 1.4 mg/dL — ABNORMAL HIGH (ref 0.57–1.00)
GFR calc non Af Amer: 37 mL/min/{1.73_m2} — ABNORMAL LOW (ref >59)
GFR, EST AFRICAN AMERICAN: 43 mL/min/{1.73_m2} — AB (ref >59)
GLOBULIN, TOTAL: 2.3 g/dL (ref 1.5–4.5)
GLUCOSE: 87 mg/dL (ref 65–99)
Potassium: 4.2 mmol/L (ref 3.5–5.2)
SODIUM: 147 mmol/L — AB (ref 134–144)
TOTAL PROTEIN: 6.5 g/dL (ref 6.0–8.5)

## 2015-11-18 LAB — CBC WITH DIFFERENTIAL/PLATELET
BASOS ABS: 0 10*3/uL (ref 0.0–0.2)
Basos: 0 %
EOS (ABSOLUTE): 0.1 10*3/uL (ref 0.0–0.4)
Eos: 1 %
Hematocrit: 32.1 % — ABNORMAL LOW (ref 34.0–46.6)
Hemoglobin: 10.3 g/dL — ABNORMAL LOW (ref 11.1–15.9)
IMMATURE GRANS (ABS): 0 10*3/uL (ref 0.0–0.1)
IMMATURE GRANULOCYTES: 0 %
LYMPHS: 18 %
Lymphocytes Absolute: 1.6 10*3/uL (ref 0.7–3.1)
MCH: 27.8 pg (ref 26.6–33.0)
MCHC: 32.1 g/dL (ref 31.5–35.7)
MCV: 87 fL (ref 79–97)
MONOS ABS: 0.5 10*3/uL (ref 0.1–0.9)
Monocytes: 6 %
NEUTROS PCT: 75 %
Neutrophils Absolute: 6.7 10*3/uL (ref 1.4–7.0)
PLATELETS: 322 10*3/uL (ref 150–379)
RBC: 3.7 x10E6/uL — AB (ref 3.77–5.28)
RDW: 15.3 % (ref 12.3–15.4)
WBC: 8.9 10*3/uL (ref 3.4–10.8)

## 2015-11-19 ENCOUNTER — Other Ambulatory Visit (INDEPENDENT_AMBULATORY_CARE_PROVIDER_SITE_OTHER): Payer: Medicare Other

## 2015-11-19 DIAGNOSIS — Z1211 Encounter for screening for malignant neoplasm of colon: Secondary | ICD-10-CM | POA: Diagnosis not present

## 2015-11-19 NOTE — Addendum Note (Signed)
Addended by: Thana Ates on: 11/19/2015 03:25 PM   Modules accepted: Orders

## 2015-11-20 LAB — FECAL OCCULT BLOOD, IMMUNOCHEMICAL: FECAL OCCULT BLD: NEGATIVE

## 2015-11-24 DIAGNOSIS — S52502D Unspecified fracture of the lower end of left radius, subsequent encounter for closed fracture with routine healing: Secondary | ICD-10-CM | POA: Diagnosis not present

## 2015-11-30 DIAGNOSIS — Z87891 Personal history of nicotine dependence: Secondary | ICD-10-CM | POA: Diagnosis not present

## 2015-11-30 DIAGNOSIS — Z79899 Other long term (current) drug therapy: Secondary | ICD-10-CM | POA: Diagnosis not present

## 2015-11-30 DIAGNOSIS — R079 Chest pain, unspecified: Secondary | ICD-10-CM | POA: Diagnosis not present

## 2015-11-30 DIAGNOSIS — R42 Dizziness and giddiness: Secondary | ICD-10-CM | POA: Diagnosis not present

## 2015-11-30 DIAGNOSIS — Z888 Allergy status to other drugs, medicaments and biological substances status: Secondary | ICD-10-CM | POA: Diagnosis not present

## 2015-11-30 DIAGNOSIS — R51 Headache: Secondary | ICD-10-CM | POA: Diagnosis not present

## 2015-11-30 DIAGNOSIS — R531 Weakness: Secondary | ICD-10-CM | POA: Diagnosis not present

## 2015-11-30 DIAGNOSIS — E785 Hyperlipidemia, unspecified: Secondary | ICD-10-CM | POA: Diagnosis not present

## 2015-11-30 DIAGNOSIS — F329 Major depressive disorder, single episode, unspecified: Secondary | ICD-10-CM | POA: Diagnosis not present

## 2015-11-30 DIAGNOSIS — Z885 Allergy status to narcotic agent status: Secondary | ICD-10-CM | POA: Diagnosis not present

## 2015-11-30 DIAGNOSIS — Z86718 Personal history of other venous thrombosis and embolism: Secondary | ICD-10-CM | POA: Diagnosis not present

## 2015-11-30 DIAGNOSIS — K219 Gastro-esophageal reflux disease without esophagitis: Secondary | ICD-10-CM | POA: Diagnosis not present

## 2015-11-30 DIAGNOSIS — M1990 Unspecified osteoarthritis, unspecified site: Secondary | ICD-10-CM | POA: Diagnosis not present

## 2015-11-30 DIAGNOSIS — Z7982 Long term (current) use of aspirin: Secondary | ICD-10-CM | POA: Diagnosis not present

## 2015-11-30 DIAGNOSIS — Z87442 Personal history of urinary calculi: Secondary | ICD-10-CM | POA: Diagnosis not present

## 2015-12-22 ENCOUNTER — Other Ambulatory Visit: Payer: Medicare Other

## 2015-12-22 DIAGNOSIS — I10 Essential (primary) hypertension: Secondary | ICD-10-CM

## 2015-12-22 DIAGNOSIS — N183 Chronic kidney disease, stage 3 unspecified: Secondary | ICD-10-CM

## 2015-12-22 DIAGNOSIS — E785 Hyperlipidemia, unspecified: Secondary | ICD-10-CM

## 2015-12-23 LAB — CBC WITH DIFFERENTIAL/PLATELET
BASOS: 0 %
Basophils Absolute: 0 10*3/uL (ref 0.0–0.2)
EOS (ABSOLUTE): 0.1 10*3/uL (ref 0.0–0.4)
EOS: 2 %
HEMATOCRIT: 33.6 % — AB (ref 34.0–46.6)
Hemoglobin: 10.7 g/dL — ABNORMAL LOW (ref 11.1–15.9)
Immature Grans (Abs): 0 10*3/uL (ref 0.0–0.1)
Immature Granulocytes: 0 %
LYMPHS ABS: 1.8 10*3/uL (ref 0.7–3.1)
Lymphs: 23 %
MCH: 28 pg (ref 26.6–33.0)
MCHC: 31.8 g/dL (ref 31.5–35.7)
MCV: 88 fL (ref 79–97)
MONOS ABS: 0.5 10*3/uL (ref 0.1–0.9)
Monocytes: 6 %
Neutrophils Absolute: 5.6 10*3/uL (ref 1.4–7.0)
Neutrophils: 69 %
Platelets: 434 10*3/uL — ABNORMAL HIGH (ref 150–379)
RBC: 3.82 x10E6/uL (ref 3.77–5.28)
RDW: 14.7 % (ref 12.3–15.4)
WBC: 8 10*3/uL (ref 3.4–10.8)

## 2015-12-23 LAB — MICROALBUMIN / CREATININE URINE RATIO
CREATININE, UR: 80.5 mg/dL
Microalb/Creat Ratio: 40.5 mg/g creat — ABNORMAL HIGH (ref 0.0–30.0)
Microalbumin, Urine: 32.6 ug/mL

## 2015-12-23 LAB — CMP14+EGFR
ALBUMIN: 3.9 g/dL (ref 3.5–4.8)
ALK PHOS: 134 IU/L — AB (ref 39–117)
ALT: 11 IU/L (ref 0–32)
AST: 18 IU/L (ref 0–40)
Albumin/Globulin Ratio: 1.6 (ref 1.2–2.2)
BILIRUBIN TOTAL: 0.4 mg/dL (ref 0.0–1.2)
BUN / CREAT RATIO: 15 (ref 12–28)
BUN: 20 mg/dL (ref 8–27)
CHLORIDE: 103 mmol/L (ref 96–106)
CO2: 22 mmol/L (ref 18–29)
Calcium: 9.3 mg/dL (ref 8.7–10.3)
Creatinine, Ser: 1.35 mg/dL — ABNORMAL HIGH (ref 0.57–1.00)
GFR calc Af Amer: 45 mL/min/{1.73_m2} — ABNORMAL LOW (ref >59)
GFR calc non Af Amer: 39 mL/min/{1.73_m2} — ABNORMAL LOW (ref >59)
GLOBULIN, TOTAL: 2.4 g/dL (ref 1.5–4.5)
GLUCOSE: 84 mg/dL (ref 65–99)
Potassium: 4.4 mmol/L (ref 3.5–5.2)
SODIUM: 145 mmol/L — AB (ref 134–144)
Total Protein: 6.3 g/dL (ref 6.0–8.5)

## 2015-12-23 LAB — LIPID PANEL
CHOLESTEROL TOTAL: 187 mg/dL (ref 100–199)
Chol/HDL Ratio: 2.6 ratio units (ref 0.0–4.4)
HDL: 72 mg/dL (ref >39)
LDL Calculated: 95 mg/dL (ref 0–99)
TRIGLYCERIDES: 102 mg/dL (ref 0–149)
VLDL Cholesterol Cal: 20 mg/dL (ref 5–40)

## 2016-01-12 ENCOUNTER — Other Ambulatory Visit: Payer: Self-pay | Admitting: Physician Assistant

## 2016-01-12 DIAGNOSIS — I10 Essential (primary) hypertension: Secondary | ICD-10-CM

## 2016-02-18 ENCOUNTER — Ambulatory Visit (INDEPENDENT_AMBULATORY_CARE_PROVIDER_SITE_OTHER): Payer: Medicare Other | Admitting: Physician Assistant

## 2016-02-18 ENCOUNTER — Encounter: Payer: Self-pay | Admitting: Physician Assistant

## 2016-02-18 VITALS — BP 123/71 | HR 80 | Temp 97.8°F | Ht 63.0 in | Wt 115.6 lb

## 2016-02-18 DIAGNOSIS — D509 Iron deficiency anemia, unspecified: Secondary | ICD-10-CM | POA: Insufficient documentation

## 2016-02-18 DIAGNOSIS — I10 Essential (primary) hypertension: Secondary | ICD-10-CM | POA: Diagnosis not present

## 2016-02-18 DIAGNOSIS — E785 Hyperlipidemia, unspecified: Secondary | ICD-10-CM | POA: Diagnosis not present

## 2016-02-18 DIAGNOSIS — K219 Gastro-esophageal reflux disease without esophagitis: Secondary | ICD-10-CM | POA: Diagnosis not present

## 2016-02-18 DIAGNOSIS — F411 Generalized anxiety disorder: Secondary | ICD-10-CM | POA: Diagnosis not present

## 2016-02-18 DIAGNOSIS — D508 Other iron deficiency anemias: Secondary | ICD-10-CM

## 2016-02-18 DIAGNOSIS — N183 Chronic kidney disease, stage 3 unspecified: Secondary | ICD-10-CM

## 2016-02-18 MED ORDER — ALPRAZOLAM 0.5 MG PO TABS: 0.5000 mg | ORAL_TABLET | Freq: Every evening | ORAL | 2 refills | Status: DC | PRN

## 2016-02-18 NOTE — Patient Instructions (Signed)

## 2016-02-19 LAB — CMP14+EGFR
A/G RATIO: 1.6 (ref 1.2–2.2)
ALK PHOS: 133 IU/L — AB (ref 39–117)
ALT: 13 IU/L (ref 0–32)
AST: 23 IU/L (ref 0–40)
Albumin: 4 g/dL (ref 3.5–4.8)
BILIRUBIN TOTAL: 0.4 mg/dL (ref 0.0–1.2)
BUN / CREAT RATIO: 16 (ref 12–28)
BUN: 23 mg/dL (ref 8–27)
CHLORIDE: 101 mmol/L (ref 96–106)
CO2: 23 mmol/L (ref 18–29)
Calcium: 9.4 mg/dL (ref 8.7–10.3)
Creatinine, Ser: 1.45 mg/dL — ABNORMAL HIGH (ref 0.57–1.00)
GFR calc Af Amer: 41 mL/min/{1.73_m2} — ABNORMAL LOW (ref >59)
GFR calc non Af Amer: 36 mL/min/{1.73_m2} — ABNORMAL LOW (ref >59)
Globulin, Total: 2.5 g/dL (ref 1.5–4.5)
Glucose: 82 mg/dL (ref 65–99)
POTASSIUM: 4.5 mmol/L (ref 3.5–5.2)
Sodium: 142 mmol/L (ref 134–144)
TOTAL PROTEIN: 6.5 g/dL (ref 6.0–8.5)

## 2016-02-19 LAB — MICROALBUMIN / CREATININE URINE RATIO
Creatinine, Urine: 88.4 mg/dL
Microalb/Creat Ratio: 73.1 mg/g creat — ABNORMAL HIGH (ref 0.0–30.0)
Microalbumin, Urine: 64.6 ug/mL

## 2016-02-19 LAB — FERRITIN: Ferritin: 92 ng/mL (ref 15–150)

## 2016-02-19 NOTE — Progress Notes (Signed)
BP 123/71   Pulse 80   Temp 97.8 F (36.6 C) (Oral)   Ht '5\' 3"'$  (1.6 m)   Wt 115 lb 9.6 oz (52.4 kg)   BMI 20.48 kg/m    Subjective:    Patient ID: Robin [name], female    DOB: Nov 29, 1940, 76 y.o.   MRN: [id number]  HPI: Robin [name] is a 76 y.o. female presenting on 02/18/2016 for Follow-up  This patient comes in for periodic recheck on medications and conditions. All medications are reviewed today. There are no reports of any problems with the medications. All of the medical conditions are reviewed and updated.  Lab work is reviewed and will be ordered as medically necessary. There are no new problems reported with today's visit.  Relevant past medical, surgical, family and social history reviewed and updated as indicated. Allergies and medications reviewed and updated.  Past Medical History:  Diagnosis Date  . Arthritis   . Hypercholesteremia   . Hypertension   . Pneumonia   . Reflux     Past Surgical History:  Procedure Laterality Date  . APPENDECTOMY    . TOTAL HIP ARTHROPLASTY    . TUBAL LIGATION      Review of Systems  Constitutional: Negative.  Negative for activity change, fatigue and fever.  HENT: Negative.   Eyes: Negative.   Respiratory: Negative.  Negative for cough.   Cardiovascular: Negative.  Negative for chest pain.  Gastrointestinal: Negative.  Negative for abdominal pain.  Endocrine: Negative.   Genitourinary: Negative.  Negative for dysuria.  Musculoskeletal: Negative.   Skin: Negative.   Neurological: Negative.     Allergies as of 02/18/2016      Reactions   Dilaudid [hydromorphone Hcl] Nausea And Vomiting   Valium [diazepam] Other (See Comments)   Family states patient has adverse reaction and displays confusion.       Medication List       Accurate as of 02/18/16 11:59 PM. Always use your most recent med list.          acetaminophen 500 MG tablet Commonly known as:  TYLENOL Take 500 mg by mouth every 6 (six) hours as needed for  mild pain.   ALPRAZolam 0.5 MG tablet Commonly known as:  XANAX Take 1 tablet (0.5 mg total) by mouth at bedtime as needed for anxiety.   amLODipine 10 MG tablet Commonly known as:  NORVASC TAKE ONE (1) TABLET EACH DAY   aspirin 81 MG tablet Take 81 mg by mouth daily.   atorvastatin 40 MG tablet Commonly known as:  LIPITOR Take 40 mg by mouth at bedtime.   cetirizine 10 MG tablet Commonly known as:  ZYRTEC TAKE 1 TABLET DAILY AS NEEDED FOR ALLERGIES   cholecalciferol 1000 units tablet Commonly known as:  VITAMIN D Take 1,000 Units by mouth daily.   esomeprazole 40 MG capsule Commonly known as:  NEXIUM Take 40 mg by mouth daily before breakfast.   meloxicam 7.5 MG tablet Commonly known as:  MOBIC Take 7.5 mg by mouth daily.          Objective:    BP 123/71   Pulse 80   Temp 97.8 F (36.6 C) (Oral)   Ht '5\' 3"'$  (1.6 m)   Wt 115 lb 9.6 oz (52.4 kg)   BMI 20.48 kg/m   Allergies  Allergen Reactions  . Dilaudid [Hydromorphone Hcl] Nausea And Vomiting  . Valium [Diazepam] Other (See Comments)    Family states patient has adverse reaction and  displays confusion.     Physical Exam  Constitutional: She is oriented to person, place, and time. She appears well-developed and well-nourished.  HENT:  Head: Normocephalic and atraumatic.  Eyes: Conjunctivae and EOM are normal. Pupils are equal, round, and reactive to light.  Cardiovascular: Normal rate, regular rhythm, normal heart sounds and intact distal pulses.   Pulmonary/Chest: Effort normal and breath sounds normal.  Abdominal: Soft. Bowel sounds are normal.  Neurological: She is alert and oriented to person, place, and time. She has normal reflexes.  Skin: Skin is warm and dry. No rash noted.  Psychiatric: She has a normal mood and affect. Her behavior is normal. Judgment and thought content normal.  Nursing note and vitals reviewed.   Results for orders placed or performed in visit on 02/18/16  Ferritin    Result Value Ref Range   Ferritin 92 15 - 150 ng/mL  CMP14+EGFR  Result Value Ref Range   Glucose 82 65 - 99 mg/dL   BUN 23 8 - 27 mg/dL   Creatinine, Ser 1.45 (H) 0.57 - 1.00 mg/dL   GFR calc non Af Amer 36 (L) >59 mL/min/1.73   GFR calc Af Amer 41 (L) >59 mL/min/1.73   BUN/Creatinine Ratio 16 12 - 28   Sodium 142 134 - 144 mmol/L   Potassium 4.5 3.5 - 5.2 mmol/L   Chloride 101 96 - 106 mmol/L   CO2 23 18 - 29 mmol/L   Calcium 9.4 8.7 - 10.3 mg/dL   Total Protein 6.5 6.0 - 8.5 g/dL   Albumin 4.0 3.5 - 4.8 g/dL   Globulin, Total 2.5 1.5 - 4.5 g/dL   Albumin/Globulin Ratio 1.6 1.2 - 2.2   Bilirubin Total 0.4 0.0 - 1.2 mg/dL   Alkaline Phosphatase 133 (H) 39 - 117 IU/L   AST 23 0 - 40 IU/L   ALT 13 0 - 32 IU/L  Microalbumin / creatinine urine ratio  Result Value Ref Range   Creatinine, Urine 88.4 Not Estab. mg/dL   Albumin, Urine 64.6 Not Estab. ug/mL   Microalb/Creat Ratio 73.1 (H) 0.0 - 30.0 mg/g creat      Assessment & Plan:   1. Essential hypertension - Ferritin - CMP14+EGFR - Microalbumin / creatinine urine ratio  2. Gastro-esophageal reflux disease without esophagitis  3. Chronic kidney disease (CKD), stage III (moderate) - Ferritin - CMP14+EGFR - Microalbumin / creatinine urine ratio  4. Hyperlipidemia, unspecified hyperlipidemia type  5. Other iron deficiency anemia - Ferritin  6. GAD (generalized anxiety disorder) - ALPRAZolam (XANAX) 0.5 MG tablet; Take 1 tablet (0.5 mg total) by mouth at bedtime as needed for anxiety.  Dispense: 30 tablet; Refill: 2   Continue all other maintenance medications as listed above.  Follow up plan: Return in about 3 months (around 05/18/2016) for recheck.  Orders Placed This Encounter  Procedures  . Ferritin  . CMP14+EGFR  . Microalbumin / creatinine urine ratio    Educational handout given for health maintenance  Terald Sleeper PA-C [facility] [address]  [place], [place]  [address] [phone]   02/19/2016, 9:20 PM

## 2016-02-20 NOTE — Addendum Note (Signed)
Addended by: Thana Ates on: 02/20/2016 09:16 AM   Modules accepted: Orders

## 2016-03-23 ENCOUNTER — Other Ambulatory Visit: Payer: Medicare Other

## 2016-03-23 DIAGNOSIS — N183 Chronic kidney disease, stage 3 unspecified: Secondary | ICD-10-CM

## 2016-03-23 LAB — BMP8+EGFR
BUN/Creatinine Ratio: 16 (ref 12–28)
BUN: 17 mg/dL (ref 8–27)
CALCIUM: 8.9 mg/dL (ref 8.7–10.3)
CO2: 24 mmol/L (ref 18–29)
Chloride: 105 mmol/L (ref 96–106)
Creatinine, Ser: 1.04 mg/dL — ABNORMAL HIGH (ref 0.57–1.00)
GFR calc non Af Amer: 53 mL/min/{1.73_m2} — ABNORMAL LOW (ref >59)
GFR, EST AFRICAN AMERICAN: 61 mL/min/{1.73_m2} (ref >59)
Glucose: 89 mg/dL (ref 65–99)
Potassium: 4.3 mmol/L (ref 3.5–5.2)
Sodium: 144 mmol/L (ref 134–144)

## 2016-04-29 DIAGNOSIS — H00011 Hordeolum externum right upper eyelid: Secondary | ICD-10-CM | POA: Diagnosis not present

## 2016-05-15 ENCOUNTER — Other Ambulatory Visit: Payer: Self-pay | Admitting: Physician Assistant

## 2016-05-15 DIAGNOSIS — I10 Essential (primary) hypertension: Secondary | ICD-10-CM

## 2016-05-15 DIAGNOSIS — F411 Generalized anxiety disorder: Secondary | ICD-10-CM

## 2016-05-18 ENCOUNTER — Encounter: Payer: Self-pay | Admitting: Physician Assistant

## 2016-05-18 ENCOUNTER — Ambulatory Visit (INDEPENDENT_AMBULATORY_CARE_PROVIDER_SITE_OTHER): Payer: Medicare Other | Admitting: Physician Assistant

## 2016-05-18 VITALS — BP 111/60 | HR 82 | Temp 98.2°F | Ht 63.0 in | Wt 121.0 lb

## 2016-05-18 DIAGNOSIS — D508 Other iron deficiency anemias: Secondary | ICD-10-CM | POA: Diagnosis not present

## 2016-05-18 DIAGNOSIS — E785 Hyperlipidemia, unspecified: Secondary | ICD-10-CM | POA: Diagnosis not present

## 2016-05-18 DIAGNOSIS — I129 Hypertensive chronic kidney disease with stage 1 through stage 4 chronic kidney disease, or unspecified chronic kidney disease: Secondary | ICD-10-CM

## 2016-05-18 DIAGNOSIS — F411 Generalized anxiety disorder: Secondary | ICD-10-CM | POA: Diagnosis not present

## 2016-05-18 DIAGNOSIS — N183 Chronic kidney disease, stage 3 unspecified: Secondary | ICD-10-CM

## 2016-05-18 DIAGNOSIS — I10 Essential (primary) hypertension: Secondary | ICD-10-CM

## 2016-05-18 MED ORDER — ALPRAZOLAM 0.5 MG PO TABS: 0.5000 mg | ORAL_TABLET | Freq: Every evening | ORAL | 5 refills | Status: DC | PRN

## 2016-05-18 NOTE — Patient Instructions (Signed)

## 2016-05-18 NOTE — Progress Notes (Signed)
BP 111/60   Pulse 82   Temp 98.2 F (36.8 C) (Oral)   Ht '5\' 3"'$  (1.6 m)   Wt 121 lb (54.9 kg)   BMI 21.43 kg/m    Subjective:    Patient ID: Robin [name], female    DOB: Sep 13, 1940, 76 y.o.   MRN: [id number]  HPI: Robin [name] is a 76 y.o. female presenting on 05/18/2016 for Hyperlipidemia and Hypertension  This patient comes in for periodic recheck on medications and conditions including Hypertension, GERD, arthritis, chronic kidney disease, hyperlipidemia. Patient states overall she is doing well and stable. She does need some updates on her medications. We reviewed all of these. We will also update her lab work.   All medications are reviewed today. There are no reports of any problems with the medications. All of the medical conditions are reviewed and updated.  Lab work is reviewed and will be ordered as medically necessary. There are no new problems reported with today's visit.   Relevant past medical, surgical, family and social history reviewed and updated as indicated. Allergies and medications reviewed and updated.  Past Medical History:  Diagnosis Date  . Arthritis   . Hypercholesteremia   . Hypertension   . Pneumonia   . Reflux     Past Surgical History:  Procedure Laterality Date  . APPENDECTOMY    . TOTAL HIP ARTHROPLASTY    . TUBAL LIGATION      Review of Systems  Constitutional: Negative.  Negative for activity change, fatigue and fever.  HENT: Negative.  Negative for congestion.   Eyes: Negative.   Respiratory: Negative.  Negative for cough.   Cardiovascular: Negative.  Negative for chest pain, palpitations and leg swelling.  Gastrointestinal: Negative.  Negative for abdominal pain.  Endocrine: Negative.   Genitourinary: Negative.  Negative for dysuria.  Musculoskeletal: Positive for arthralgias.  Skin: Negative.   Neurological: Negative.     Allergies as of 05/18/2016      Reactions   Dilaudid [hydromorphone Hcl] Nausea And Vomiting   Valium  [diazepam] Other (See Comments)   Family states patient has adverse reaction and displays confusion.       Medication List       Accurate as of 05/18/16  9:16 AM. Always use your most recent med list.          acetaminophen 500 MG tablet Commonly known as:  TYLENOL Take 500 mg by mouth every 6 (six) hours as needed for mild pain.   ALPRAZolam 0.5 MG tablet Commonly known as:  XANAX Take 1 tablet (0.5 mg total) by mouth at bedtime as needed.   amLODipine 10 MG tablet Commonly known as:  NORVASC TAKE ONE (1) TABLET EACH DAY   aspirin 81 MG tablet Take 81 mg by mouth daily.   atorvastatin 40 MG tablet Commonly known as:  LIPITOR Take 40 mg by mouth at bedtime.   cetirizine 10 MG tablet Commonly known as:  ZYRTEC TAKE 1 TABLET DAILY AS NEEDED FOR ALLERGIES   cholecalciferol 1000 units tablet Commonly known as:  VITAMIN D Take 1,000 Units by mouth daily.   esomeprazole 40 MG capsule Commonly known as:  NEXIUM Take 40 mg by mouth daily before breakfast.   meloxicam 7.5 MG tablet Commonly known as:  MOBIC Take 7.5 mg by mouth daily.          Objective:    BP 111/60   Pulse 82   Temp 98.2 F (36.8 C) (Oral)   Ht 5'  3" (1.6 m)   Wt 121 lb (54.9 kg)   BMI 21.43 kg/m   Allergies  Allergen Reactions  . Dilaudid [Hydromorphone Hcl] Nausea And Vomiting  . Valium [Diazepam] Other (See Comments)    Family states patient has adverse reaction and displays confusion.     Physical Exam  Constitutional: She is oriented to person, place, and time. She appears well-developed and well-nourished.  HENT:  Head: Normocephalic and atraumatic.  Eyes: Conjunctivae and EOM are normal. Pupils are equal, round, and reactive to light.  Cardiovascular: Normal rate, regular rhythm, normal heart sounds and intact distal pulses.   Pulmonary/Chest: Effort normal and breath sounds normal.  Abdominal: Soft. Bowel sounds are normal.  Neurological: She is alert and oriented to person,  place, and time. She has normal reflexes.  Skin: Skin is warm and dry. No rash noted.  Psychiatric: She has a normal mood and affect. Her behavior is normal. Judgment and thought content normal.  Nursing note and vitals reviewed.   Results for orders placed or performed in visit on 03/23/16  [facility]  Result Value Ref Range   Glucose 89 65 - 99 mg/dL   BUN 17 8 - 27 mg/dL   Creatinine, Ser 1.04 (H) 0.57 - 1.00 mg/dL   GFR calc non Af Amer 53 (L) >59 mL/min/1.73   GFR calc Af Amer 61 >59 mL/min/1.73   BUN/Creatinine Ratio 16 12 - 28   Sodium 144 134 - 144 mmol/L   Potassium 4.3 3.5 - 5.2 mmol/L   Chloride 105 96 - 106 mmol/L   CO2 24 18 - 29 mmol/L   Calcium 8.9 8.7 - 10.3 mg/dL      Assessment & Plan:   1. GAD (generalized anxiety disorder) - ALPRAZolam (XANAX) 0.5 MG tablet; Take 1 tablet (0.5 mg total) by mouth at bedtime as needed.  Dispense: 30 tablet; Refill: 5  2. Essential hypertension - CMP14+EGFR - Microalbumin / creatinine urine ratio - CBC with Differential/Platelet  3. Chronic kidney disease (CKD), stage III (moderate) - CMP14+EGFR - Microalbumin / creatinine urine ratio - CBC with Differential/Platelet  4. Other iron deficiency anemia  5. Hyperlipidemia, unspecified hyperlipidemia type - Lipid panel   Continue all other maintenance medications as listed above.  Follow up plan: Return in about 6 months (around 11/17/2016) for recheck.  Educational handout given for hypertension  Terald Sleeper PA-C [facility] [address]  [place], [place] [address] [phone]   05/18/2016, 9:16 AM

## 2016-05-19 LAB — CBC WITH DIFFERENTIAL/PLATELET
BASOS: 0 %
Basophils Absolute: 0 10*3/uL (ref 0.0–0.2)
EOS (ABSOLUTE): 0.1 10*3/uL (ref 0.0–0.4)
EOS: 2 %
Hematocrit: 34.1 % (ref 34.0–46.6)
Hemoglobin: 10.6 g/dL — ABNORMAL LOW (ref 11.1–15.9)
IMMATURE GRANS (ABS): 0.1 10*3/uL (ref 0.0–0.1)
IMMATURE GRANULOCYTES: 1 %
LYMPHS: 17 %
Lymphocytes Absolute: 1.5 10*3/uL (ref 0.7–3.1)
MCH: 27.2 pg (ref 26.6–33.0)
MCHC: 31.1 g/dL — ABNORMAL LOW (ref 31.5–35.7)
MCV: 87 fL (ref 79–97)
Monocytes Absolute: 0.7 10*3/uL (ref 0.1–0.9)
Monocytes: 8 %
NEUTROS PCT: 72 %
Neutrophils Absolute: 6.5 10*3/uL (ref 1.4–7.0)
PLATELETS: 254 10*3/uL (ref 150–379)
RBC: 3.9 x10E6/uL (ref 3.77–5.28)
RDW: 16.2 % — ABNORMAL HIGH (ref 12.3–15.4)
WBC: 8.9 10*3/uL (ref 3.4–10.8)

## 2016-05-19 LAB — CMP14+EGFR
A/G RATIO: 1.9 (ref 1.2–2.2)
ALK PHOS: 105 IU/L (ref 39–117)
ALT: 19 IU/L (ref 0–32)
AST: 22 IU/L (ref 0–40)
Albumin: 3.7 g/dL (ref 3.5–4.8)
BUN/Creatinine Ratio: 18 (ref 12–28)
BUN: 22 mg/dL (ref 8–27)
Bilirubin Total: 0.7 mg/dL (ref 0.0–1.2)
CO2: 26 mmol/L (ref 18–29)
Calcium: 8.9 mg/dL (ref 8.7–10.3)
Chloride: 103 mmol/L (ref 96–106)
Creatinine, Ser: 1.22 mg/dL — ABNORMAL HIGH (ref 0.57–1.00)
GFR calc Af Amer: 50 mL/min/{1.73_m2} — ABNORMAL LOW (ref >59)
GFR calc non Af Amer: 44 mL/min/{1.73_m2} — ABNORMAL LOW (ref >59)
GLOBULIN, TOTAL: 1.9 g/dL (ref 1.5–4.5)
Glucose: 82 mg/dL (ref 65–99)
POTASSIUM: 4.3 mmol/L (ref 3.5–5.2)
SODIUM: 143 mmol/L (ref 134–144)
Total Protein: 5.6 g/dL — ABNORMAL LOW (ref 6.0–8.5)

## 2016-05-19 LAB — LIPID PANEL
CHOL/HDL RATIO: 2 ratio (ref 0.0–4.4)
Cholesterol, Total: 212 mg/dL — ABNORMAL HIGH (ref 100–199)
HDL: 104 mg/dL (ref >39)
LDL Calculated: 95 mg/dL (ref 0–99)
TRIGLYCERIDES: 65 mg/dL (ref 0–149)
VLDL Cholesterol Cal: 13 mg/dL (ref 5–40)

## 2016-05-26 ENCOUNTER — Encounter: Payer: Self-pay | Admitting: *Deleted

## 2016-05-26 DIAGNOSIS — Z85828 Personal history of other malignant neoplasm of skin: Secondary | ICD-10-CM | POA: Diagnosis not present

## 2016-05-26 DIAGNOSIS — D485 Neoplasm of uncertain behavior of skin: Secondary | ICD-10-CM | POA: Diagnosis not present

## 2016-05-26 DIAGNOSIS — L570 Actinic keratosis: Secondary | ICD-10-CM | POA: Diagnosis not present

## 2016-05-26 DIAGNOSIS — C4441 Basal cell carcinoma of skin of scalp and neck: Secondary | ICD-10-CM | POA: Diagnosis not present

## 2016-05-26 DIAGNOSIS — C44219 Basal cell carcinoma of skin of left ear and external auricular canal: Secondary | ICD-10-CM | POA: Diagnosis not present

## 2016-06-24 DIAGNOSIS — C44211 Basal cell carcinoma of skin of unspecified ear and external auricular canal: Secondary | ICD-10-CM | POA: Insufficient documentation

## 2016-06-24 DIAGNOSIS — C44219 Basal cell carcinoma of skin of left ear and external auricular canal: Secondary | ICD-10-CM | POA: Diagnosis not present

## 2016-06-24 DIAGNOSIS — Z87891 Personal history of nicotine dependence: Secondary | ICD-10-CM | POA: Diagnosis not present

## 2016-06-30 ENCOUNTER — Other Ambulatory Visit: Payer: Self-pay | Admitting: Physician Assistant

## 2016-08-13 ENCOUNTER — Other Ambulatory Visit: Payer: Self-pay | Admitting: Physician Assistant

## 2016-09-21 ENCOUNTER — Ambulatory Visit (INDEPENDENT_AMBULATORY_CARE_PROVIDER_SITE_OTHER): Payer: Medicare Other | Admitting: Physician Assistant

## 2016-09-21 ENCOUNTER — Encounter: Payer: Self-pay | Admitting: Physician Assistant

## 2016-09-21 VITALS — BP 122/65 | HR 77 | Temp 97.3°F | Ht 63.0 in | Wt 124.4 lb

## 2016-09-21 DIAGNOSIS — R350 Frequency of micturition: Secondary | ICD-10-CM | POA: Diagnosis not present

## 2016-09-21 DIAGNOSIS — M545 Low back pain, unspecified: Secondary | ICD-10-CM

## 2016-09-21 DIAGNOSIS — C44219 Basal cell carcinoma of skin of left ear and external auricular canal: Secondary | ICD-10-CM

## 2016-09-21 DIAGNOSIS — R600 Localized edema: Secondary | ICD-10-CM

## 2016-09-21 LAB — URINALYSIS, COMPLETE
Bilirubin, UA: NEGATIVE
GLUCOSE, UA: NEGATIVE
Ketones, UA: NEGATIVE
Leukocytes, UA: NEGATIVE
NITRITE UA: NEGATIVE
Protein, UA: NEGATIVE
Specific Gravity, UA: 1.01 (ref 1.005–1.030)
Urobilinogen, Ur: 0.2 mg/dL (ref 0.2–1.0)
pH, UA: 5 (ref 5.0–7.5)

## 2016-09-21 LAB — MICROSCOPIC EXAMINATION: Renal Epithel, UA: NONE SEEN /hpf

## 2016-09-21 MED ORDER — PREDNISONE 10 MG (21) PO TBPK: ORAL_TABLET | ORAL | 0 refills | Status: DC

## 2016-09-21 MED ORDER — SULFAMETHOXAZOLE-TRIMETHOPRIM 400-80 MG PO TABS: 1.0000 | ORAL_TABLET | Freq: Two times a day (BID) | ORAL | 0 refills | Status: DC

## 2016-09-21 MED ORDER — HYDROCHLOROTHIAZIDE 25 MG PO TABS: 25.0000 mg | ORAL_TABLET | Freq: Every day | ORAL | 0 refills | Status: DC

## 2016-09-21 NOTE — Patient Instructions (Signed)
In a few days you may receive a survey in the mail or online from [employer] regarding your visit with us today. Please take a moment to fill this out. Your feedback is very important to our whole office. It can help us better understand your needs as well as improve your experience and satisfaction. Thank you for taking your time to complete it. We care about you.  Allysia Ingles, PA-C  

## 2016-09-22 LAB — URINE CULTURE: Organism ID, Bacteria: NO GROWTH

## 2016-09-22 NOTE — Progress Notes (Signed)
BP 122/65   Pulse 77   Temp (!) 97.3 F (36.3 C) (Oral)   Ht 5\' 3"  (1.6 m)   Wt 124 lb 6.4 oz (56.4 kg)   BMI 22.04 kg/m    Subjective:    Patient ID: Robin [name], female    DOB: 12-Jun-1940, 76 y.o.   MRN: [id number]  HPI: Robin [name] is a 76 y.o. female presenting on 09/21/2016 for Back Pain; Urinary Frequency; and Joint Swelling  Several problems. First is frequent urination. She has had a history of urinary tract infections in the past. She denies any severe pain. She has some discomfort over her bladder. She has had this for several days.  She is also having a flareup of her low back pain, is primarily on the right. She does not have any radiation down the leg. She is having slight lower leg edema. She has issues with this at times. She tries to keep a low-salt diet. There are no changes in the skin in this area.   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 She has been followed at [facility] for this.  Relevant past medical, surgical, family and social history reviewed and updated as indicated. Allergies and medications reviewed and updated.  Past Medical History:  Diagnosis Date  . Arthritis   . Hypercholesteremia   . Hypertension   . Pneumonia   . Reflux     Past Surgical History:  Procedure Laterality Date  . APPENDECTOMY    . TOTAL HIP ARTHROPLASTY    . TUBAL LIGATION      Review of Systems  Constitutional: Negative.  Negative for activity change, fatigue and fever.  HENT: Negative.   Eyes: Negative.   Respiratory: Negative.  Negative for cough.   Cardiovascular: Negative.  Negative for chest pain.  Gastrointestinal: Negative.  Negative for abdominal pain.  Endocrine: Negative.   Genitourinary: Positive for frequency. Negative for difficulty urinating, dysuria and enuresis.  Musculoskeletal: Positive for back pain. Negative for gait problem.  Skin: Negative.   Neurological: Negative.     Allergies as of  09/21/2016      Reactions   Dilaudid [hydromorphone Hcl] Nausea And Vomiting   Valium [diazepam] Other (See Comments)   Family states patient has adverse reaction and displays confusion.       Medication List       Accurate as of 09/21/16 11:59 PM. Always use your most recent med list.          acetaminophen 500 MG tablet Commonly known as:  TYLENOL Take 500 mg by mouth every 6 (six) hours as needed for mild pain.   ALPRAZolam 0.5 MG tablet Commonly known as:  XANAX Take 1 tablet (0.5 mg total) by mouth at bedtime as needed.   amLODipine 10 MG tablet Commonly known as:  NORVASC TAKE ONE (1) TABLET EACH DAY   aspirin 81 MG tablet Take 81 mg by mouth daily.   atorvastatin 40 MG tablet Commonly known as:  LIPITOR TAKE ONE (1) TABLET EACH DAY   cetirizine 10 MG tablet Commonly known as:  ZYRTEC TAKE 1 TABLET DAILY AS NEEDED FOR ALLERGIES   cholecalciferol 1000 units tablet Commonly known as:  VITAMIN D Take 1,000 Units by mouth daily.   esomeprazole 40 MG capsule Commonly known as:  NEXIUM TAKE ONE (1) CAPSULE EACH DAY   hydrochlorothiazide 25 MG tablet Commonly known as:  HYDRODIURIL Take 1 tablet (25 mg total) by  mouth daily. Take for fluid   meloxicam 7.5 MG tablet Commonly known as:  MOBIC Take 7.5 mg by mouth daily.   predniSONE 10 MG (21) Tbpk tablet Commonly known as:  STERAPRED UNI-PAK 21 TAB As directed x 6 days   sulfamethoxazole-trimethoprim 400-80 MG tablet Commonly known as:  BACTRIM,SEPTRA Take 1 tablet by mouth 2 (two) times daily.          Objective:    BP 122/65   Pulse 77   Temp (!) 97.3 F (36.3 C) (Oral)   Ht 5\' 3"  (1.6 m)   Wt 124 lb 6.4 oz (56.4 kg)   BMI 22.04 kg/m   Allergies  Allergen Reactions  . Dilaudid [Hydromorphone Hcl] Nausea And Vomiting  . Valium [Diazepam] Other (See Comments)    Family states patient has adverse reaction and displays confusion.     Physical Exam  Constitutional: She is oriented to  person, place, and time. She appears well-developed and well-nourished.  HENT:  Head: Normocephalic and atraumatic.  Eyes: Pupils are equal, round, and reactive to light. Conjunctivae and EOM are normal.  Cardiovascular: Normal rate, regular rhythm, normal heart sounds and intact distal pulses.   Pulmonary/Chest: Effort normal and breath sounds normal.  Abdominal: Soft. Bowel sounds are normal.  Neurological: She is alert and oriented to person, place, and time. She has normal reflexes.  Skin: Skin is warm and dry. No rash noted.  Psychiatric: She has a normal mood and affect. Her behavior is normal. Judgment and thought content normal.  Nursing note and vitals reviewed.   Results for orders placed or performed in visit on 09/21/16  Microscopic Examination  Result Value Ref Range   WBC, UA 0-5 0 - 5 /hpf   RBC, UA 0-2 0 - 2 /hpf   Epithelial Cells (non renal) 0-10 0 - 10 /hpf   Renal Epithel, UA None seen None seen /hpf   Bacteria, UA Few None seen/Few  Urinalysis, Complete  Result Value Ref Range   Specific Gravity, UA 1.010 1.005 - 1.030   pH, UA 5.0 5.0 - 7.5   Color, UA Yellow Yellow   Appearance Ur Clear Clear   Leukocytes, UA Negative Negative   Protein, UA Negative Negative/Trace   Glucose, UA Negative Negative   Ketones, UA Negative Negative   RBC, UA Trace (A) Negative   Bilirubin, UA Negative Negative   Urobilinogen, Ur 0.2 0.2 - 1.0 mg/dL   Nitrite, UA Negative Negative   Microscopic Examination See below:       Assessment & Plan:   1. Frequent urination - Urine Culture - Urinalysis, Complete - sulfamethoxazole-trimethoprim (BACTRIM,SEPTRA) 400-80 MG tablet; Take 1 tablet by mouth 2 (two) times daily.  Dispense: 14 tablet; Refill: 0 - Microscopic Examination  2. Acute right-sided low back pain without sciatica - predniSONE (STERAPRED UNI-PAK 21 TAB) 10 MG (21) TBPK tablet; As directed x 6 days  Dispense: 21 tablet; Refill: 0  3. Localized edema -  hydrochlorothiazide (HYDRODIURIL) 25 MG tablet; Take 1 tablet (25 mg total) by mouth daily. Take for fluid  Dispense: 30 tablet; Refill: 0  4. 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  acetaminophen (TYLENOL) 500 MG tablet, Take 500 mg by mouth every 6 (six) hours as needed for mild pain., Disp: , Rfl:  .  ALPRAZolam (XANAX) 0.5 MG tablet, Take 1 tablet (0.5 mg total) by  mouth at bedtime as needed., Disp: 30 tablet, Rfl: 5 .  amLODipine (NORVASC) 10 MG tablet, TAKE ONE (1) TABLET EACH DAY, Disp: 90 tablet, Rfl: 3 .  aspirin 81 MG tablet, Take 81 mg by mouth daily., Disp: , Rfl:  .  atorvastatin (LIPITOR) 40 MG tablet, TAKE ONE (1) TABLET EACH DAY, Disp: 90 tablet, Rfl: 1 .  cetirizine (ZYRTEC) 10 MG tablet, TAKE 1 TABLET DAILY AS NEEDED FOR ALLERGIES, Disp: 30 tablet, Rfl: 3 .  cholecalciferol (VITAMIN D) 1000 UNITS tablet, Take 1,000 Units by mouth daily., Disp: , Rfl:  .  esomeprazole (NEXIUM) 40 MG capsule, TAKE ONE (1) CAPSULE EACH DAY, Disp: 90 capsule, Rfl: 1 .  meloxicam (MOBIC) 7.5 MG tablet, Take 7.5 mg by mouth daily., Disp: , Rfl:  .  hydrochlorothiazide (HYDRODIURIL) 25 MG tablet, Take 1 tablet (25 mg total) by mouth daily. Take for fluid, Disp: 30 tablet, Rfl: 0 .  predniSONE (STERAPRED UNI-PAK 21 TAB) 10 MG (21) TBPK tablet, As directed x 6 days, Disp: 21 tablet, Rfl: 0 .  sulfamethoxazole-trimethoprim (BACTRIM,SEPTRA) 400-80 MG tablet, Take 1 tablet by mouth 2 (two) times daily., Disp: 14 tablet, Rfl: 0 Continue all other maintenance medications as listed above.  Follow up plan: Return in about 3 months (around 12/22/2016) for recheck.  Educational handout given for [place] PA-C [facility] [address]  [place], [place] [address] [phone]   09/22/2016, 9:23 AM

## 2016-09-27 DIAGNOSIS — L570 Actinic keratosis: Secondary | ICD-10-CM | POA: Diagnosis not present

## 2016-09-27 DIAGNOSIS — Z85828 Personal history of other malignant neoplasm of skin: Secondary | ICD-10-CM | POA: Diagnosis not present

## 2016-09-27 DIAGNOSIS — C44219 Basal cell carcinoma of skin of left ear and external auricular canal: Secondary | ICD-10-CM | POA: Diagnosis not present

## 2016-09-28 ENCOUNTER — Other Ambulatory Visit: Payer: Self-pay | Admitting: Physician Assistant

## 2016-11-18 ENCOUNTER — Ambulatory Visit: Payer: Medicare Other | Admitting: Physician Assistant

## 2016-11-19 ENCOUNTER — Ambulatory Visit: Payer: Medicare Other | Admitting: Physician Assistant

## 2016-12-22 ENCOUNTER — Encounter: Payer: Self-pay | Admitting: Physician Assistant

## 2016-12-22 ENCOUNTER — Ambulatory Visit (INDEPENDENT_AMBULATORY_CARE_PROVIDER_SITE_OTHER): Payer: Medicare Other | Admitting: Physician Assistant

## 2016-12-22 VITALS — BP 115/59 | HR 63 | Temp 97.2°F | Ht 63.0 in | Wt 128.4 lb

## 2016-12-22 DIAGNOSIS — E785 Hyperlipidemia, unspecified: Secondary | ICD-10-CM | POA: Diagnosis not present

## 2016-12-22 DIAGNOSIS — F411 Generalized anxiety disorder: Secondary | ICD-10-CM | POA: Diagnosis not present

## 2016-12-22 DIAGNOSIS — M2062 Acquired deformities of toe(s), unspecified, left foot: Secondary | ICD-10-CM | POA: Diagnosis not present

## 2016-12-22 DIAGNOSIS — M25572 Pain in left ankle and joints of left foot: Secondary | ICD-10-CM

## 2016-12-22 DIAGNOSIS — N183 Chronic kidney disease, stage 3 unspecified: Secondary | ICD-10-CM

## 2016-12-22 DIAGNOSIS — I10 Essential (primary) hypertension: Secondary | ICD-10-CM

## 2016-12-22 DIAGNOSIS — M722 Plantar fascial fibromatosis: Secondary | ICD-10-CM

## 2016-12-22 DIAGNOSIS — G8929 Other chronic pain: Secondary | ICD-10-CM | POA: Diagnosis not present

## 2016-12-22 DIAGNOSIS — M25579 Pain in unspecified ankle and joints of unspecified foot: Secondary | ICD-10-CM | POA: Insufficient documentation

## 2016-12-22 LAB — CBC WITH DIFFERENTIAL/PLATELET
BASOS ABS: 0 10*3/uL (ref 0.0–0.2)
Basos: 0 %
EOS (ABSOLUTE): 0.2 10*3/uL (ref 0.0–0.4)
Eos: 2 %
HEMOGLOBIN: 10.8 g/dL — AB (ref 11.1–15.9)
Hematocrit: 34 % (ref 34.0–46.6)
IMMATURE GRANS (ABS): 0 10*3/uL (ref 0.0–0.1)
IMMATURE GRANULOCYTES: 0 %
LYMPHS: 25 %
Lymphocytes Absolute: 1.8 10*3/uL (ref 0.7–3.1)
MCH: 27.9 pg (ref 26.6–33.0)
MCHC: 31.8 g/dL (ref 31.5–35.7)
MCV: 88 fL (ref 79–97)
MONOCYTES: 7 %
Monocytes Absolute: 0.5 10*3/uL (ref 0.1–0.9)
NEUTROS ABS: 4.8 10*3/uL (ref 1.4–7.0)
NEUTROS PCT: 66 %
Platelets: 320 10*3/uL (ref 150–379)
RBC: 3.87 x10E6/uL (ref 3.77–5.28)
RDW: 15.3 % (ref 12.3–15.4)
WBC: 7.3 10*3/uL (ref 3.4–10.8)

## 2016-12-22 LAB — CMP14+EGFR
ALT: 11 IU/L (ref 0–32)
AST: 21 IU/L (ref 0–40)
Albumin/Globulin Ratio: 1.8 (ref 1.2–2.2)
Albumin: 3.9 g/dL (ref 3.5–4.8)
Alkaline Phosphatase: 136 IU/L — ABNORMAL HIGH (ref 39–117)
BUN/Creatinine Ratio: 14 (ref 12–28)
BUN: 18 mg/dL (ref 8–27)
Bilirubin Total: 0.5 mg/dL (ref 0.0–1.2)
CALCIUM: 9.2 mg/dL (ref 8.7–10.3)
CHLORIDE: 104 mmol/L (ref 96–106)
CO2: 22 mmol/L (ref 20–29)
Creatinine, Ser: 1.29 mg/dL — ABNORMAL HIGH (ref 0.57–1.00)
GFR calc non Af Amer: 41 mL/min/{1.73_m2} — ABNORMAL LOW (ref >59)
GFR, EST AFRICAN AMERICAN: 47 mL/min/{1.73_m2} — AB (ref >59)
Globulin, Total: 2.2 g/dL (ref 1.5–4.5)
Glucose: 85 mg/dL (ref 65–99)
Potassium: 4.3 mmol/L (ref 3.5–5.2)
Sodium: 140 mmol/L (ref 134–144)
Total Protein: 6.1 g/dL (ref 6.0–8.5)

## 2016-12-22 LAB — LIPID PANEL
CHOLESTEROL TOTAL: 169 mg/dL (ref 100–199)
Chol/HDL Ratio: 2.2 ratio (ref 0.0–4.4)
HDL: 76 mg/dL (ref >39)
LDL Calculated: 72 mg/dL (ref 0–99)
TRIGLYCERIDES: 106 mg/dL (ref 0–149)
VLDL Cholesterol Cal: 21 mg/dL (ref 5–40)

## 2016-12-22 MED ORDER — ALPRAZOLAM 0.5 MG PO TABS: 0.5000 mg | ORAL_TABLET | Freq: Every evening | ORAL | 5 refills | Status: DC | PRN

## 2016-12-22 MED ORDER — PREDNISONE 10 MG PO TABS: 10.0000 mg | ORAL_TABLET | Freq: Every day | ORAL | 1 refills | Status: DC

## 2016-12-22 NOTE — Patient Instructions (Signed)

## 2016-12-22 NOTE — Progress Notes (Signed)
BP (!) 115/59   Pulse 63   Temp (!) 97.2 F (36.2 C) (Oral)   Ht 5\' 3"  (1.6 m)   Wt 128 lb 6.4 oz (58.2 kg)   BMI 22.75 kg/m    Subjective:    Patient ID: Robin [name], female    DOB: 02/03/1941, 76 y.o.   MRN: [id number]  HPI: Robin [name] is a 76 y.o. female presenting on 12/22/2016 for Follow-up (3 month )  This patient comes in for 35-month recheck on her medical conditions.  Overall she is feeling good.  We need to perform labs today.  She is having difficulty with her left foot.  She has had episodes of where it hurt in the past.  Mainly across the top of the foot.  At this time she is having lower ankle, calcaneus and plantar pain.  She is also having pain in her second and third toe.  She said that they are disfigured.  She does not report any change in skin color or temperature.  She has not had any recent injury.  Relevant past medical, surgical, family and social history reviewed and updated as indicated. Allergies and medications reviewed and updated.  Past Medical History:  Diagnosis Date  . Arthritis   . Hypercholesteremia   . Hypertension   . Pneumonia   . Reflux     Past Surgical History:  Procedure Laterality Date  . APPENDECTOMY    . TOTAL HIP ARTHROPLASTY    . TUBAL LIGATION      Review of Systems  Constitutional: Negative.   HENT: Negative.   Eyes: Negative.   Respiratory: Negative.   Gastrointestinal: Negative.   Genitourinary: Negative.   Musculoskeletal: Positive for arthralgias, gait problem and joint swelling.  Psychiatric/Behavioral: Positive for sleep disturbance. The patient is nervous/anxious.     Allergies as of 12/22/2016      Reactions   Dilaudid [hydromorphone Hcl] Nausea And Vomiting   Valium [diazepam] Other (See Comments)   Family states patient has adverse reaction and displays confusion.       Medication List        Accurate as of 12/22/16  8:36 AM. Always use your most recent med list.          acetaminophen 500 MG  tablet Commonly known as:  TYLENOL Take 500 mg by mouth every 6 (six) hours as needed for mild pain.   ALPRAZolam 0.5 MG tablet Commonly known as:  XANAX Take 1 tablet (0.5 mg total) at bedtime as needed by mouth.   amLODipine 10 MG tablet Commonly known as:  NORVASC TAKE ONE (1) TABLET EACH DAY   aspirin 81 MG tablet Take 81 mg by mouth daily.   atorvastatin 40 MG tablet Commonly known as:  LIPITOR TAKE ONE (1) TABLET EACH DAY   cetirizine 10 MG tablet Commonly known as:  ZYRTEC TAKE 1 TABLET DAILY AS NEEDED FOR ALLERGIES   cholecalciferol 1000 units tablet Commonly known as:  VITAMIN D Take 1,000 Units by mouth daily.   esomeprazole 40 MG capsule Commonly known as:  NEXIUM TAKE ONE (1) CAPSULE EACH DAY   meloxicam 7.5 MG tablet Commonly known as:  MOBIC Take 7.5 mg by mouth daily.   predniSONE 10 MG tablet Commonly known as:  DELTASONE Take 1 tablet (10 mg total) daily with breakfast by mouth.          Objective:    BP (!) 115/59   Pulse 63   Temp (!) 97.2 F (36.2  C) (Oral)   Ht 5\' 3"  (1.6 m)   Wt 128 lb 6.4 oz (58.2 kg)   BMI 22.75 kg/m   Allergies  Allergen Reactions  . Dilaudid [Hydromorphone Hcl] Nausea And Vomiting  . Valium [Diazepam] Other (See Comments)    Family states patient has adverse reaction and displays confusion.     Physical Exam  Constitutional: She is oriented to person, place, and time. She appears well-developed and well-nourished.  HENT:  Head: Normocephalic and atraumatic.  Eyes: Conjunctivae and EOM are normal. Pupils are equal, round, and reactive to light.  Cardiovascular: Normal rate, regular rhythm, normal heart sounds and intact distal pulses.  Pulmonary/Chest: Effort normal and breath sounds normal.  Abdominal: Soft. Bowel sounds are normal.  Musculoskeletal:       Left foot: There is tenderness and deformity. There is normal capillary refill.       Feet:  Arthritis changes in the second and third toe.  It is  causing some contracture of the toes and they are starting to rub against her shoes.  He can see callus type wearing.  Pulses are good today, normal temperature.  Neurological: She is alert and oriented to person, place, and time. She has normal reflexes.  Skin: Skin is warm and dry. No rash noted.  Psychiatric: She has a normal mood and affect. Her behavior is normal. Judgment and thought content normal.        Assessment & Plan:   1. GAD (generalized anxiety disorder) - ALPRAZolam (XANAX) 0.5 MG tablet; Take 1 tablet (0.5 mg total) at bedtime as needed by mouth.  Dispense: 30 tablet; Refill: 5  2. Toe deformity, acquired, left - Ambulatory referral to Orthopedic Surgery  3. Chronic pain of left ankle - Ambulatory referral to Orthopedic Surgery  4. Plantar fasciitis of left foot - Ambulatory referral to Orthopedic Surgery  5. Chronic kidney disease (CKD), stage III (moderate) (HCC) - Microalbumin / creatinine urine ratio    Current Outpatient Medications:  .  acetaminophen (TYLENOL) 500 MG tablet, Take 500 mg by mouth every 6 (six) hours as needed for mild pain., Disp: , Rfl:  .  ALPRAZolam (XANAX) 0.5 MG tablet, Take 1 tablet (0.5 mg total) at bedtime as needed by mouth., Disp: 30 tablet, Rfl: 5 .  amLODipine (NORVASC) 10 MG tablet, TAKE ONE (1) TABLET EACH DAY, Disp: 90 tablet, Rfl: 3 .  aspirin 81 MG tablet, Take 81 mg by mouth daily., Disp: , Rfl:  .  atorvastatin (LIPITOR) 40 MG tablet, TAKE ONE (1) TABLET EACH DAY, Disp: 90 tablet, Rfl: 0 .  cetirizine (ZYRTEC) 10 MG tablet, TAKE 1 TABLET DAILY AS NEEDED FOR ALLERGIES, Disp: 30 tablet, Rfl: 3 .  cholecalciferol (VITAMIN D) 1000 UNITS tablet, Take 1,000 Units by mouth daily., Disp: , Rfl:  .  esomeprazole (NEXIUM) 40 MG capsule, TAKE ONE (1) CAPSULE EACH DAY, Disp: 90 capsule, Rfl: 0 .  meloxicam (MOBIC) 7.5 MG tablet, Take 7.5 mg by mouth daily., Disp: , Rfl:  .  predniSONE (DELTASONE) 10 MG tablet, Take 1 tablet (10 mg  total) daily with breakfast by mouth., Disp: 30 tablet, Rfl: 1 Continue all other maintenance medications as listed above.  Follow up plan: Return in about 6 months (around 06/21/2017) for recheck.  Educational handout given for plantar exercises  Terald Sleeper PA-C [facility] [address]  [place], [place] [address] [phone]   12/22/2016, 8:36 AM

## 2016-12-23 LAB — MICROALBUMIN / CREATININE URINE RATIO
Creatinine, Urine: 52.1 mg/dL
MICROALB/CREAT RATIO: 27.4 mg/g{creat} (ref 0.0–30.0)
Microalbumin, Urine: 14.3 ug/mL

## 2016-12-28 ENCOUNTER — Other Ambulatory Visit: Payer: Self-pay | Admitting: Physician Assistant

## 2017-01-14 ENCOUNTER — Other Ambulatory Visit: Payer: Self-pay | Admitting: Physician Assistant

## 2017-01-25 ENCOUNTER — Other Ambulatory Visit: Payer: Self-pay | Admitting: Physician Assistant

## 2017-01-25 NOTE — Telephone Encounter (Signed)
Last seen 12/22/16  [facility]

## 2017-02-03 DIAGNOSIS — M659 Synovitis and tenosynovitis, unspecified: Secondary | ICD-10-CM | POA: Diagnosis not present

## 2017-02-03 DIAGNOSIS — M19079 Primary osteoarthritis, unspecified ankle and foot: Secondary | ICD-10-CM | POA: Diagnosis not present

## 2017-02-03 DIAGNOSIS — M2042 Other hammer toe(s) (acquired), left foot: Secondary | ICD-10-CM | POA: Diagnosis not present

## 2017-02-03 DIAGNOSIS — M76822 Posterior tibial tendinitis, left leg: Secondary | ICD-10-CM | POA: Diagnosis not present

## 2017-03-14 ENCOUNTER — Telehealth: Payer: Self-pay | Admitting: Physician Assistant

## 2017-03-14 ENCOUNTER — Other Ambulatory Visit: Payer: Self-pay | Admitting: Physician Assistant

## 2017-03-14 MED ORDER — PREDNISONE 10 MG PO TABS: ORAL_TABLET | ORAL | 0 refills | Status: DC

## 2017-03-14 NOTE — Telephone Encounter (Signed)
sent 

## 2017-03-14 NOTE — Telephone Encounter (Signed)
What is the name of the medication? Prednisone 10 mg  Have you contacted your pharmacy to request a refill? NO  Which pharmacy [facility] you [facility] [place]   Patient notified that their request is being sent to the clinical staff for review and that they should receive a call once it is complete. If they do not receive a call within 24 hours they can check with their pharmacy or our office.

## 2017-03-14 NOTE — Telephone Encounter (Signed)
Patient aware.

## 2017-03-28 DIAGNOSIS — Z85828 Personal history of other malignant neoplasm of skin: Secondary | ICD-10-CM | POA: Diagnosis not present

## 2017-03-28 DIAGNOSIS — L570 Actinic keratosis: Secondary | ICD-10-CM | POA: Diagnosis not present

## 2017-03-28 DIAGNOSIS — D485 Neoplasm of uncertain behavior of skin: Secondary | ICD-10-CM | POA: Diagnosis not present

## 2017-03-28 DIAGNOSIS — C44319 Basal cell carcinoma of skin of other parts of face: Secondary | ICD-10-CM | POA: Diagnosis not present

## 2017-03-30 ENCOUNTER — Other Ambulatory Visit: Payer: Self-pay | Admitting: Physician Assistant

## 2017-04-05 DIAGNOSIS — F329 Major depressive disorder, single episode, unspecified: Secondary | ICD-10-CM | POA: Diagnosis not present

## 2017-04-05 DIAGNOSIS — S80812A Abrasion, left lower leg, initial encounter: Secondary | ICD-10-CM | POA: Diagnosis not present

## 2017-04-05 DIAGNOSIS — S92342A Displaced fracture of fourth metatarsal bone, left foot, initial encounter for closed fracture: Secondary | ICD-10-CM | POA: Diagnosis not present

## 2017-04-05 DIAGNOSIS — Z791 Long term (current) use of non-steroidal anti-inflammatories (NSAID): Secondary | ICD-10-CM | POA: Diagnosis not present

## 2017-04-05 DIAGNOSIS — K219 Gastro-esophageal reflux disease without esophagitis: Secondary | ICD-10-CM | POA: Diagnosis not present

## 2017-04-05 DIAGNOSIS — Z87891 Personal history of nicotine dependence: Secondary | ICD-10-CM | POA: Diagnosis not present

## 2017-04-05 DIAGNOSIS — S0990XA Unspecified injury of head, initial encounter: Secondary | ICD-10-CM | POA: Diagnosis not present

## 2017-04-05 DIAGNOSIS — S8991XA Unspecified injury of right lower leg, initial encounter: Secondary | ICD-10-CM | POA: Diagnosis not present

## 2017-04-05 DIAGNOSIS — Z885 Allergy status to narcotic agent status: Secondary | ICD-10-CM | POA: Diagnosis not present

## 2017-04-05 DIAGNOSIS — E785 Hyperlipidemia, unspecified: Secondary | ICD-10-CM | POA: Diagnosis not present

## 2017-04-05 DIAGNOSIS — W101XXA Fall (on)(from) sidewalk curb, initial encounter: Secondary | ICD-10-CM | POA: Diagnosis not present

## 2017-04-05 DIAGNOSIS — S0081XA Abrasion of other part of head, initial encounter: Secondary | ICD-10-CM | POA: Diagnosis not present

## 2017-04-05 DIAGNOSIS — S92352A Displaced fracture of fifth metatarsal bone, left foot, initial encounter for closed fracture: Secondary | ICD-10-CM | POA: Diagnosis not present

## 2017-04-05 DIAGNOSIS — Z7982 Long term (current) use of aspirin: Secondary | ICD-10-CM | POA: Diagnosis not present

## 2017-04-05 DIAGNOSIS — S0003XA Contusion of scalp, initial encounter: Secondary | ICD-10-CM | POA: Diagnosis not present

## 2017-04-05 DIAGNOSIS — S80811A Abrasion, right lower leg, initial encounter: Secondary | ICD-10-CM | POA: Diagnosis not present

## 2017-04-05 DIAGNOSIS — S62322A Displaced fracture of shaft of third metacarpal bone, right hand, initial encounter for closed fracture: Secondary | ICD-10-CM | POA: Diagnosis not present

## 2017-04-05 DIAGNOSIS — S62332A Displaced fracture of neck of third metacarpal bone, right hand, initial encounter for closed fracture: Secondary | ICD-10-CM | POA: Diagnosis not present

## 2017-04-05 DIAGNOSIS — Z79899 Other long term (current) drug therapy: Secondary | ICD-10-CM | POA: Diagnosis not present

## 2017-04-05 DIAGNOSIS — Z86718 Personal history of other venous thrombosis and embolism: Secondary | ICD-10-CM | POA: Diagnosis not present

## 2017-04-05 DIAGNOSIS — S8992XA Unspecified injury of left lower leg, initial encounter: Secondary | ICD-10-CM | POA: Diagnosis not present

## 2017-04-05 DIAGNOSIS — Z888 Allergy status to other drugs, medicaments and biological substances status: Secondary | ICD-10-CM | POA: Diagnosis not present

## 2017-04-05 DIAGNOSIS — S92332A Displaced fracture of third metatarsal bone, left foot, initial encounter for closed fracture: Secondary | ICD-10-CM | POA: Diagnosis not present

## 2017-04-06 ENCOUNTER — Telehealth: Payer: Self-pay

## 2017-04-06 ENCOUNTER — Other Ambulatory Visit: Payer: Self-pay | Admitting: Physician Assistant

## 2017-04-06 DIAGNOSIS — S92342A Displaced fracture of fourth metatarsal bone, left foot, initial encounter for closed fracture: Secondary | ICD-10-CM

## 2017-04-06 NOTE — Progress Notes (Signed)
o

## 2017-04-06 NOTE — Telephone Encounter (Signed)
For what condition?

## 2017-04-06 NOTE — Telephone Encounter (Signed)
I need referral to go to [facility] because I was in [facility]

## 2017-04-06 NOTE — Telephone Encounter (Signed)
Closed displaced fracture of metatarsal bone of left foot, unspecified metatarsal, initial encounter (Primary Dx); Closed displaced fracture of third metacarpal bone of right hand, unspecified portion of metacarpal, initial encounter. Looks like she was seen in ER on 04/05/2017 @ [facility].

## 2017-04-06 NOTE — Telephone Encounter (Signed)
Order has been placed, and requested as urgent

## 2017-04-07 NOTE — Telephone Encounter (Signed)
Pt aware referral order placed.

## 2017-04-13 DIAGNOSIS — S52502D Unspecified fracture of the lower end of left radius, subsequent encounter for closed fracture with routine healing: Secondary | ICD-10-CM | POA: Diagnosis not present

## 2017-04-27 DIAGNOSIS — S52502D Unspecified fracture of the lower end of left radius, subsequent encounter for closed fracture with routine healing: Secondary | ICD-10-CM | POA: Diagnosis not present

## 2017-05-12 ENCOUNTER — Other Ambulatory Visit: Payer: Self-pay | Admitting: Physician Assistant

## 2017-05-12 DIAGNOSIS — I10 Essential (primary) hypertension: Secondary | ICD-10-CM

## 2017-05-12 NOTE — Telephone Encounter (Signed)
OV 06/21/17

## 2017-05-18 DIAGNOSIS — M25572 Pain in left ankle and joints of left foot: Secondary | ICD-10-CM | POA: Diagnosis not present

## 2017-05-25 ENCOUNTER — Ambulatory Visit (INDEPENDENT_AMBULATORY_CARE_PROVIDER_SITE_OTHER): Payer: Medicare Other | Admitting: Physician Assistant

## 2017-05-25 ENCOUNTER — Encounter: Payer: Self-pay | Admitting: Physician Assistant

## 2017-05-25 VITALS — BP 134/64 | HR 85 | Temp 97.2°F | Ht 63.0 in | Wt 134.2 lb

## 2017-05-25 DIAGNOSIS — N183 Chronic kidney disease, stage 3 unspecified: Secondary | ICD-10-CM

## 2017-05-25 DIAGNOSIS — Z8781 Personal history of (healed) traumatic fracture: Secondary | ICD-10-CM

## 2017-05-25 DIAGNOSIS — M25551 Pain in right hip: Secondary | ICD-10-CM

## 2017-05-25 DIAGNOSIS — I10 Essential (primary) hypertension: Secondary | ICD-10-CM

## 2017-05-25 DIAGNOSIS — Z87820 Personal history of traumatic brain injury: Secondary | ICD-10-CM

## 2017-05-25 LAB — CBC WITH DIFFERENTIAL/PLATELET
BASOS: 0 %
Basophils Absolute: 0 10*3/uL (ref 0.0–0.2)
EOS (ABSOLUTE): 0.1 10*3/uL (ref 0.0–0.4)
Eos: 1 %
HEMATOCRIT: 35.4 % (ref 34.0–46.6)
HEMOGLOBIN: 11 g/dL — AB (ref 11.1–15.9)
IMMATURE GRANULOCYTES: 1 %
Immature Grans (Abs): 0.1 10*3/uL (ref 0.0–0.1)
LYMPHS ABS: 2.9 10*3/uL (ref 0.7–3.1)
Lymphs: 23 %
MCH: 27.8 pg (ref 26.6–33.0)
MCHC: 31.1 g/dL — ABNORMAL LOW (ref 31.5–35.7)
MCV: 90 fL (ref 79–97)
MONOCYTES: 10 %
Monocytes Absolute: 1.2 10*3/uL — ABNORMAL HIGH (ref 0.1–0.9)
NEUTROS PCT: 65 %
Neutrophils Absolute: 8.4 10*3/uL — ABNORMAL HIGH (ref 1.4–7.0)
Platelets: 455 10*3/uL — ABNORMAL HIGH (ref 150–379)
RBC: 3.95 x10E6/uL (ref 3.77–5.28)
RDW: 15 % (ref 12.3–15.4)
WBC: 12.7 10*3/uL — ABNORMAL HIGH (ref 3.4–10.8)

## 2017-05-25 LAB — CMP14+EGFR
ALBUMIN: 4.1 g/dL (ref 3.5–4.8)
ALT: 12 IU/L (ref 0–32)
AST: 14 IU/L (ref 0–40)
Albumin/Globulin Ratio: 1.9 (ref 1.2–2.2)
Alkaline Phosphatase: 142 IU/L — ABNORMAL HIGH (ref 39–117)
BUN / CREAT RATIO: 18 (ref 12–28)
BUN: 32 mg/dL — AB (ref 8–27)
Bilirubin Total: 0.3 mg/dL (ref 0.0–1.2)
CALCIUM: 9.3 mg/dL (ref 8.7–10.3)
CO2: 24 mmol/L (ref 20–29)
CREATININE: 1.82 mg/dL — AB (ref 0.57–1.00)
Chloride: 102 mmol/L (ref 96–106)
GFR calc Af Amer: 31 mL/min/{1.73_m2} — ABNORMAL LOW (ref >59)
GFR, EST NON AFRICAN AMERICAN: 27 mL/min/{1.73_m2} — AB (ref >59)
GLOBULIN, TOTAL: 2.2 g/dL (ref 1.5–4.5)
Glucose: 74 mg/dL (ref 65–99)
Potassium: 4.4 mmol/L (ref 3.5–5.2)
SODIUM: 142 mmol/L (ref 134–144)
TOTAL PROTEIN: 6.3 g/dL (ref 6.0–8.5)

## 2017-05-25 LAB — LIPID PANEL
CHOL/HDL RATIO: 2 ratio (ref 0.0–4.4)
Cholesterol, Total: 186 mg/dL (ref 100–199)
HDL: 92 mg/dL (ref >39)
LDL CALC: 73 mg/dL (ref 0–99)
Triglycerides: 107 mg/dL (ref 0–149)
VLDL Cholesterol Cal: 21 mg/dL (ref 5–40)

## 2017-05-25 MED ORDER — PREDNISONE 10 MG PO TABS: ORAL_TABLET | ORAL | 0 refills | Status: DC

## 2017-05-25 MED ORDER — METHOCARBAMOL 500 MG PO TABS: 500.0000 mg | ORAL_TABLET | Freq: Three times a day (TID) | ORAL | 0 refills | Status: DC | PRN

## 2017-05-25 NOTE — Patient Instructions (Signed)

## 2017-05-26 LAB — MICROALBUMIN / CREATININE URINE RATIO
CREATININE, UR: 74.6 mg/dL
MICROALB/CREAT RATIO: 11.1 mg/g{creat} (ref 0.0–30.0)
MICROALBUM., U, RANDOM: 8.3 ug/mL

## 2017-05-26 NOTE — Progress Notes (Signed)
BP 134/64   Pulse 85   Temp (!) 97.2 F (36.2 C) (Oral)   Ht _0  (1.6 m)   Wt 134 lb 3.2 oz (60.9 kg)   BMI 23.77 kg/m    Subjective:    Patient ID: Robin [name], female    DOB: 13-Jul-1940, 77 y.o.   MRN: [id number]  HPI: Robin [name] is a 76 y.o. female presenting on 05/25/2017 for Back Pain  Patient comes in for periodic recheck on her conditions.  She does have severe osteoarthritis, GERD, hyperlipidemia, allergic rhinitis, hypertension.  In the past she has had some renal insufficiency.  It was more around the time that she had surgery.  About 4 weeks ago she had a fill where she hit her right hip and head.  She took a couple of bones in her hands.  She did go and see Dr. Percell Miller.  At this point she is healing well she then had a right metatarsal bone that had a fracture.  She was unable to wear the brace for very long because it was so bulky.  They said that she even had a concussion of her head.  A CT scan was performed.  Past Medical History:  Diagnosis Date  . Arthritis   . Hypercholesteremia   . Hypertension   . Pneumonia   . Reflux    Relevant past medical, surgical, family and social history reviewed and updated as indicated. Interim medical history since our last visit reviewed. Allergies and medications reviewed and updated. DATA REVIEWED: CHART IN EPIC  Family History reviewed for pertinent findings.  Review of Systems  Constitutional: Negative.   HENT: Negative.   Eyes: Negative.   Respiratory: Negative.   Gastrointestinal: Negative.   Genitourinary: Negative.     Allergies as of 05/25/2017      Reactions   Dilaudid [hydromorphone Hcl] Nausea And Vomiting   Valium [diazepam] Other (See Comments)   Family states patient has adverse reaction and displays confusion.       Medication List        Accurate as of 05/25/17 11:59 PM. Always use your most recent med list.          acetaminophen 500 MG tablet Commonly known as:  TYLENOL Take 500 mg by  mouth every 6 (six) hours as needed for mild pain.   ALPRAZolam 0.5 MG tablet Commonly known as:  XANAX Take 1 tablet (0.5 mg total) at bedtime as needed by mouth.   amLODipine 10 MG tablet Commonly known as:  NORVASC TAKE ONE (1) TABLET EACH DAY   aspirin 81 MG tablet Take 81 mg by mouth daily.   atorvastatin 40 MG tablet Commonly known as:  LIPITOR TAKE ONE (1) TABLET EACH DAY   cetirizine 10 MG tablet Commonly known as:  ZYRTEC TAKE 1 TABLET DAILY AS NEEDED FOR ALLERGIES   cholecalciferol 1000 units tablet Commonly known as:  VITAMIN D Take 1,000 Units by mouth daily.   esomeprazole 40 MG capsule Commonly known as:  NEXIUM TAKE ONE (1) CAPSULE EACH DAY   meloxicam 7.5 MG tablet Commonly known as:  MOBIC TAKE ONE (1) TABLET EACH DAY   methocarbamol 500 MG tablet Commonly known as:  ROBAXIN Take 1 tablet (500 mg total) by mouth every 8 (eight) hours as needed for muscle spasms.   predniSONE 10 MG tablet Commonly known as:  DELTASONE TAKE 1 TABLET EVERY MORNING WITH BREAKFAST          Objective:  BP 134/64   Pulse 85   Temp (!) 97.2 F (36.2 C) (Oral)   Ht _0  (1.6 m)   Wt 134 lb 3.2 oz (60.9 kg)   BMI 23.77 kg/m   Allergies  Allergen Reactions  . Dilaudid [Hydromorphone Hcl] Nausea And Vomiting  . Valium [Diazepam] Other (See Comments)    Family states patient has adverse reaction and displays confusion.     Wt Readings from Last 3 Encounters:  05/25/17 134 lb 3.2 oz (60.9 kg)  12/22/16 128 lb 6.4 oz (58.2 kg)  09/21/16 124 lb 6.4 oz (56.4 kg)    Physical Exam  Constitutional: She is oriented to person, place, and time. She appears well-developed and well-nourished.  HENT:  Head: Normocephalic and atraumatic.  Eyes: Pupils are equal, round, and reactive to light. Conjunctivae and EOM are normal.  Cardiovascular: Normal rate, regular rhythm, normal heart sounds and intact distal pulses.  Pulmonary/Chest: Effort normal and breath sounds  normal.  Abdominal: Soft. Bowel sounds are normal.  Neurological: She is alert and oriented to person, place, and time. She has normal reflexes.  Skin: Skin is warm and dry. No rash noted.  Psychiatric: She has a normal mood and affect. Her behavior is normal. Judgment and thought content normal.    Results for orders placed or performed in visit on 05/25/17  CMP14+EGFR  Result Value Ref Range   Glucose 74 65 - 99 mg/dL   BUN 32 (H) 8 - 27 mg/dL   Creatinine, Ser 1.82 (H) 0.57 - 1.00 mg/dL   GFR calc non Af Amer 27 (L) >59 mL/min/1.73   GFR calc Af Amer 31 (L) >59 mL/min/1.73   BUN/Creatinine Ratio 18 12 - 28   Sodium 142 134 - 144 mmol/L   Potassium 4.4 3.5 - 5.2 mmol/L   Chloride 102 96 - 106 mmol/L   CO2 24 20 - 29 mmol/L   Calcium 9.3 8.7 - 10.3 mg/dL   Total Protein 6.3 6.0 - 8.5 g/dL   Albumin 4.1 3.5 - 4.8 g/dL   Globulin, Total 2.2 1.5 - 4.5 g/dL   Albumin/Globulin Ratio 1.9 1.2 - 2.2   Bilirubin Total 0.3 0.0 - 1.2 mg/dL   Alkaline Phosphatase 142 (H) 39 - 117 IU/L   AST 14 0 - 40 IU/L   ALT 12 0 - 32 IU/L  CBC with Differential/Platelet  Result Value Ref Range   WBC 12.7 (H) 3.4 - 10.8 x10E3/uL   RBC 3.95 3.77 - 5.28 x10E6/uL   Hemoglobin 11.0 (L) 11.1 - 15.9 g/dL   Hematocrit 35.4 34.0 - 46.6 %   MCV 90 79 - 97 fL   MCH 27.8 26.6 - 33.0 pg   MCHC 31.1 (L) 31.5 - 35.7 g/dL   RDW 15.0 12.3 - 15.4 %   Platelets 455 (H) 150 - 379 x10E3/uL   Neutrophils 65 Not Estab. %   Lymphs 23 Not Estab. %   Monocytes 10 Not Estab. %   Eos 1 Not Estab. %   Basos 0 Not Estab. %   Neutrophils Absolute 8.4 (H) 1.4 - 7.0 x10E3/uL   Lymphocytes Absolute 2.9 0.7 - 3.1 x10E3/uL   Monocytes Absolute 1.2 (H) 0.1 - 0.9 x10E3/uL   EOS (ABSOLUTE) 0.1 0.0 - 0.4 x10E3/uL   Basophils Absolute 0.0 0.0 - 0.2 x10E3/uL   Immature Granulocytes 1 Not Estab. %   Immature Grans (Abs) 0.1 0.0 - 0.1 x10E3/uL  Lipid panel  Result Value Ref Range   Cholesterol, Total  186 100 - 199 mg/dL    Triglycerides 107 0 - 149 mg/dL   HDL 92 >39 mg/dL   VLDL Cholesterol Cal 21 5 - 40 mg/dL   LDL Calculated 73 0 - 99 mg/dL   Chol/HDL Ratio 2.0 0.0 - 4.4 ratio  Microalbumin / creatinine urine ratio  Result Value Ref Range   Creatinine, Urine 74.6 Not Estab. mg/dL   Microalbumin, Urine 8.3 Not Estab. ug/mL   Microalb/Creat Ratio 11.1 0.0 - 30.0 mg/g creat      Assessment & Plan:   1. History of concussion  2. Pain of right hip joint  3. History of foot fracture  4. Essential hypertension - CMP14+EGFR - CBC with Differential/Platelet - Lipid panel - Microalbumin / creatinine urine ratio  5. Chronic kidney disease (CKD), stage III (moderate) (HCC) - CMP14+EGFR - CBC with Differential/Platelet - Lipid panel - Microalbumin / creatinine urine ratio   Continue all other maintenance medications as listed above.  Follow up plan: Return in about 2 months (around 07/25/2017).  Educational handout given for [place] PA-C [facility] [address]  [place], [place] [address] [phone]   05/26/2017, 1:56 PM

## 2017-06-21 ENCOUNTER — Ambulatory Visit (INDEPENDENT_AMBULATORY_CARE_PROVIDER_SITE_OTHER): Payer: Medicare Other | Admitting: Physician Assistant

## 2017-06-21 ENCOUNTER — Encounter: Payer: Self-pay | Admitting: Physician Assistant

## 2017-06-21 VITALS — BP 129/69 | HR 81 | Temp 97.4°F | Ht 63.0 in | Wt 130.0 lb

## 2017-06-21 DIAGNOSIS — N183 Chronic kidney disease, stage 3 unspecified: Secondary | ICD-10-CM

## 2017-06-21 DIAGNOSIS — F411 Generalized anxiety disorder: Secondary | ICD-10-CM | POA: Insufficient documentation

## 2017-06-21 DIAGNOSIS — I10 Essential (primary) hypertension: Secondary | ICD-10-CM

## 2017-06-21 LAB — CMP14+EGFR
A/G RATIO: 1.6 (ref 1.2–2.2)
ALBUMIN: 3.9 g/dL (ref 3.5–4.8)
ALK PHOS: 125 IU/L — AB (ref 39–117)
ALT: 10 IU/L (ref 0–32)
AST: 13 IU/L (ref 0–40)
BUN/Creatinine Ratio: 13 (ref 12–28)
BUN: 23 mg/dL (ref 8–27)
Bilirubin Total: 0.3 mg/dL (ref 0.0–1.2)
CALCIUM: 9.1 mg/dL (ref 8.7–10.3)
CO2: 21 mmol/L (ref 20–29)
Chloride: 106 mmol/L (ref 96–106)
Creatinine, Ser: 1.79 mg/dL — ABNORMAL HIGH (ref 0.57–1.00)
GFR calc Af Amer: 32 mL/min/{1.73_m2} — ABNORMAL LOW (ref >59)
GFR calc non Af Amer: 27 mL/min/{1.73_m2} — ABNORMAL LOW (ref >59)
GLOBULIN, TOTAL: 2.4 g/dL (ref 1.5–4.5)
Glucose: 93 mg/dL (ref 65–99)
POTASSIUM: 4.5 mmol/L (ref 3.5–5.2)
Sodium: 142 mmol/L (ref 134–144)
Total Protein: 6.3 g/dL (ref 6.0–8.5)

## 2017-06-21 LAB — CBC WITH DIFFERENTIAL/PLATELET
BASOS: 0 %
Basophils Absolute: 0 10*3/uL (ref 0.0–0.2)
EOS (ABSOLUTE): 0.1 10*3/uL (ref 0.0–0.4)
EOS: 1 %
HEMATOCRIT: 35.4 % (ref 34.0–46.6)
HEMOGLOBIN: 11.1 g/dL (ref 11.1–15.9)
Immature Grans (Abs): 0.1 10*3/uL (ref 0.0–0.1)
Immature Granulocytes: 1 %
Lymphocytes Absolute: 2.1 10*3/uL (ref 0.7–3.1)
Lymphs: 18 %
MCH: 27.8 pg (ref 26.6–33.0)
MCHC: 31.4 g/dL — AB (ref 31.5–35.7)
MCV: 89 fL (ref 79–97)
MONOCYTES: 8 %
Monocytes Absolute: 1 10*3/uL — ABNORMAL HIGH (ref 0.1–0.9)
Neutrophils Absolute: 8.3 10*3/uL — ABNORMAL HIGH (ref 1.4–7.0)
Neutrophils: 72 %
Platelets: 340 10*3/uL (ref 150–379)
RBC: 4 x10E6/uL (ref 3.77–5.28)
RDW: 15 % (ref 12.3–15.4)
WBC: 11.5 10*3/uL — ABNORMAL HIGH (ref 3.4–10.8)

## 2017-06-21 MED ORDER — ALPRAZOLAM 0.5 MG PO TABS: 0.5000 mg | ORAL_TABLET | Freq: Every evening | ORAL | 5 refills | Status: DC | PRN

## 2017-06-21 NOTE — Patient Instructions (Signed)
In a few days you may receive a survey in the mail or online from [employer] regarding your visit with us today. Please take a moment to fill this out. Your feedback is very important to our whole office. It can help us better understand your needs as well as improve your experience and satisfaction. Thank you for taking your time to complete it. We care about you.  Kirin Brandenburger, PA-C  

## 2017-06-21 NOTE — Progress Notes (Signed)
BP 129/69   Pulse 81   Temp (!) 97.4 F (36.3 C) (Oral)   Ht '5\' 3"'$  (1.6 m)   Wt 130 lb (59 kg)   BMI 23.03 kg/m    Subjective:    Patient ID: Robin [name], female    DOB: [birth year], 77 y.o.   MRN: [id number]  HPI: Robin [name] is a 77 y.o. female presenting on 06/21/2017 for No chief complaint on file.  Patient comes in for periodic recheck on her chronic medical conditions.  They do include hypertension, chronic kidney disease, generalized anxiety.  She states overall she is doing fairly well.  She is not having to take her Mobic to much have encouraged her to call this to see if we can have an improvement in her renal function.  She has been stable in this area for some time.  We will have labs done today.  Her blood pressure is well controlled and she is tolerating all of her medicines.  She will be coming for an annual wellness visit soon.  Past Medical History:  Diagnosis Date  . Arthritis   . Hypercholesteremia   . Hypertension   . Pneumonia   . Reflux    Relevant past medical, surgical, family and social history reviewed and updated as indicated. Interim medical history since our last visit reviewed. Allergies and medications reviewed and updated. DATA REVIEWED: CHART IN EPIC  Family History reviewed for pertinent findings.  Review of Systems  Constitutional: Negative.  Negative for activity change, fatigue and fever.  HENT: Negative.   Eyes: Negative.   Respiratory: Negative.  Negative for cough.   Cardiovascular: Negative.  Negative for chest pain.  Gastrointestinal: Negative.  Negative for abdominal pain.  Endocrine: Negative.   Genitourinary: Negative.  Negative for dysuria.  Musculoskeletal: Positive for arthralgias, gait problem, joint swelling and myalgias.  Skin: Negative.     Allergies as of 06/21/2017      Reactions   Dilaudid [hydromorphone Hcl] Nausea And Vomiting   Lisinopril Cough   Valium [diazepam] Other (See Comments)   Family states patient  has adverse reaction and displays confusion.       Medication List        Accurate as of 06/21/17  9:38 AM. Always use your most recent med list.          acetaminophen 500 MG tablet Commonly known as:  TYLENOL Take 500 mg by mouth every 6 (six) hours as needed for mild pain.   ALPRAZolam 0.5 MG tablet Commonly known as:  XANAX Take 1 tablet (0.5 mg total) by mouth at bedtime as needed.   amLODipine 10 MG tablet Commonly known as:  NORVASC TAKE ONE (1) TABLET EACH DAY   aspirin 81 MG tablet Take 81 mg by mouth daily.   atorvastatin 40 MG tablet Commonly known as:  LIPITOR TAKE ONE (1) TABLET EACH DAY   cetirizine 10 MG tablet Commonly known as:  ZYRTEC TAKE 1 TABLET DAILY AS NEEDED FOR ALLERGIES   cholecalciferol 1000 units tablet Commonly known as:  VITAMIN D Take 1,000 Units by mouth daily.   esomeprazole 40 MG capsule Commonly known as:  NEXIUM TAKE ONE (1) CAPSULE EACH DAY   meloxicam 7.5 MG tablet Commonly known as:  MOBIC TAKE ONE (1) TABLET EACH DAY   methocarbamol 500 MG tablet Commonly known as:  ROBAXIN Take 1 tablet (500 mg total) by mouth every 8 (eight) hours as needed for muscle spasms.   predniSONE 10 MG tablet  Commonly known as:  DELTASONE TAKE 1 TABLET EVERY MORNING WITH BREAKFAST          Objective:    BP 129/69   Pulse 81   Temp (!) 97.4 F (36.3 C) (Oral)   Ht '5\' 3"'$  (1.6 m)   Wt 130 lb (59 kg)   BMI 23.03 kg/m   Allergies  Allergen Reactions  . Dilaudid [Hydromorphone Hcl] Nausea And Vomiting  . Lisinopril Cough  . Valium [Diazepam] Other (See Comments)    Family states patient has adverse reaction and displays confusion.     Wt Readings from Last 3 Encounters:  06/21/17 130 lb (59 kg)  05/25/17 134 lb 3.2 oz (60.9 kg)  12/22/16 128 lb 6.4 oz (58.2 kg)    Physical Exam  Constitutional: She is oriented to person, place, and time. She appears well-developed and well-nourished.  HENT:  Head: Normocephalic and  atraumatic.  Right Ear: Tympanic membrane, external ear and ear canal normal.  Left Ear: Tympanic membrane, external ear and ear canal normal.  Nose: Nose normal. No rhinorrhea.  Mouth/Throat: Oropharynx is clear and moist and mucous membranes are normal. No oropharyngeal exudate or posterior oropharyngeal erythema.  Eyes: Pupils are equal, round, and reactive to light. Conjunctivae and EOM are normal.  Neck: Normal range of motion. Neck supple.  Cardiovascular: Normal rate, regular rhythm, normal heart sounds and intact distal pulses.  Pulmonary/Chest: Effort normal and breath sounds normal.  Abdominal: Soft. Bowel sounds are normal.  Neurological: She is alert and oriented to person, place, and time. She has normal reflexes.  Skin: Skin is warm and dry. No rash noted.  Psychiatric: She has a normal mood and affect. Her behavior is normal. Judgment and thought content normal.    Results for orders placed or performed in visit on 05/25/17  CMP14+EGFR  Result Value Ref Range   Glucose 74 65 - 99 mg/dL   BUN 32 (H) 8 - 27 mg/dL   Creatinine, Ser 1.82 (H) 0.57 - 1.00 mg/dL   GFR calc non Af Amer 27 (L) >59 mL/min/1.73   GFR calc Af Amer 31 (L) >59 mL/min/1.73   BUN/Creatinine Ratio 18 12 - 28   Sodium 142 134 - 144 mmol/L   Potassium 4.4 3.5 - 5.2 mmol/L   Chloride 102 96 - 106 mmol/L   CO2 24 20 - 29 mmol/L   Calcium 9.3 8.7 - 10.3 mg/dL   Total Protein 6.3 6.0 - 8.5 g/dL   Albumin 4.1 3.5 - 4.8 g/dL   Globulin, Total 2.2 1.5 - 4.5 g/dL   Albumin/Globulin Ratio 1.9 1.2 - 2.2   Bilirubin Total 0.3 0.0 - 1.2 mg/dL   Alkaline Phosphatase 142 (H) 39 - 117 IU/L   AST 14 0 - 40 IU/L   ALT 12 0 - 32 IU/L  CBC with Differential/Platelet  Result Value Ref Range   WBC 12.7 (H) 3.4 - 10.8 x10E3/uL   RBC 3.95 3.77 - 5.28 x10E6/uL   Hemoglobin 11.0 (L) 11.1 - 15.9 g/dL   Hematocrit 35.4 34.0 - 46.6 %   MCV 90 79 - 97 fL   MCH 27.8 26.6 - 33.0 pg   MCHC 31.1 (L) 31.5 - 35.7 g/dL   RDW  15.0 12.3 - 15.4 %   Platelets 455 (H) 150 - 379 x10E3/uL   Neutrophils 65 Not Estab. %   Lymphs 23 Not Estab. %   Monocytes 10 Not Estab. %   Eos 1 Not Estab. %   Basos  0 Not Estab. %   Neutrophils Absolute 8.4 (H) 1.4 - 7.0 x10E3/uL   Lymphocytes Absolute 2.9 0.7 - 3.1 x10E3/uL   Monocytes Absolute 1.2 (H) 0.1 - 0.9 x10E3/uL   EOS (ABSOLUTE) 0.1 0.0 - 0.4 x10E3/uL   Basophils Absolute 0.0 0.0 - 0.2 x10E3/uL   Immature Granulocytes 1 Not Estab. %   Immature Grans (Abs) 0.1 0.0 - 0.1 x10E3/uL  Lipid panel  Result Value Ref Range   Cholesterol, Total 186 100 - 199 mg/dL   Triglycerides 107 0 - 149 mg/dL   HDL 92 >39 mg/dL   VLDL Cholesterol Cal 21 5 - 40 mg/dL   LDL Calculated 73 0 - 99 mg/dL   Chol/HDL Ratio 2.0 0.0 - 4.4 ratio  Microalbumin / creatinine urine ratio  Result Value Ref Range   Creatinine, Urine 74.6 Not Estab. mg/dL   Microalbumin, Urine 8.3 Not Estab. ug/mL   Microalb/Creat Ratio 11.1 0.0 - 30.0 mg/g creat      Assessment & Plan:   1. Essential hypertension  2. Chronic kidney disease (CKD), stage III (moderate) (HCC) - CBC with Differential/Platelet - CMP14+EGFR  3. GAD (generalized anxiety disorder) - ALPRAZolam (XANAX) 0.5 MG tablet; Take 1 tablet (0.5 mg total) by mouth at bedtime as needed.  Dispense: 30 tablet; Refill: 5   Continue all other maintenance medications as listed above.  Follow up plan: Recheck 3-6 months  Educational handout given for [place] PA-C [place] [address]  [place], [place] [address] [phone]   06/21/2017, 9:38 AM

## 2017-06-30 ENCOUNTER — Ambulatory Visit (INDEPENDENT_AMBULATORY_CARE_PROVIDER_SITE_OTHER): Payer: Medicare Other | Admitting: *Deleted

## 2017-06-30 ENCOUNTER — Other Ambulatory Visit: Payer: Self-pay | Admitting: Physician Assistant

## 2017-06-30 ENCOUNTER — Encounter: Payer: Self-pay | Admitting: *Deleted

## 2017-06-30 VITALS — BP 118/66 | HR 84 | Ht 60.0 in | Wt 129.0 lb

## 2017-06-30 DIAGNOSIS — Z780 Asymptomatic menopausal state: Secondary | ICD-10-CM

## 2017-06-30 DIAGNOSIS — Z0000 Encounter for general adult medical examination without abnormal findings: Secondary | ICD-10-CM

## 2017-06-30 DIAGNOSIS — Z1211 Encounter for screening for malignant neoplasm of colon: Secondary | ICD-10-CM

## 2017-06-30 NOTE — Patient Instructions (Signed)
  Robin [name] , Thank you for taking time to come for your Medicare Wellness Visit. I appreciate your ongoing commitment to your health goals. Please review the following plan we discussed and let me know if I can assist you in the future.   These are the goals we discussed: Goals    . Exercise 150 min/wk Moderate Activity       This is a list of the screening recommended for you and due dates:  Health Maintenance  Topic Date Due  . DEXA scan (bone density measurement)  08/22/2006  . Stool Blood Test  11/18/2016  . Tetanus Vaccine  07/01/2018*  . Pneumonia vaccines (1 of 2 - PCV13) 07/01/2018*  . Flu Shot  09/08/2017  . Colon Cancer Screening  Discontinued  *Topic was postponed. The date shown is not the original due date.

## 2017-06-30 NOTE — Progress Notes (Addendum)
Subjective:   Robin [name] is a 77 y.o. female who presents for an Initial Medicare Annual Wellness Visit. 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 Her husband passed away in 05/03/2014. Lives at home with daughter and great grandson who is in middle school  Review of [place] is about the same as last year  Cardiac Risk Factors include: advanced age (>12men, >72 women);family history of premature cardiovascular disease;hypertension;dyslipidemia     Objective:    Today's Vitals   06/30/17 1427  BP: 118/66  Pulse: 84  Weight: 129 lb (58.5 kg)  Height: 5' (1.524 m)   Body mass index is 25.19 kg/m.  Advanced Directives 06/30/2017 05/08/2015 08/04/2014  Does Patient Have a Medical Advance Directive? No No No  Would patient like information on creating a medical advance directive? No - Patient declined - -    Current Medications (verified) Outpatient Encounter Medications as of 06/30/2017  Medication Sig  . acetaminophen (TYLENOL) 500 MG tablet Take 500 mg by mouth every 6 (six) hours as needed for mild pain.  Marland Kitchen ALPRAZolam (XANAX) 0.5 MG tablet Take 1 tablet (0.5 mg total) by mouth at bedtime as needed.  Marland Kitchen amLODipine (NORVASC) 10 MG tablet TAKE ONE (1) TABLET EACH DAY  . aspirin 81 MG tablet Take 81 mg by mouth daily.  Marland Kitchen atorvastatin (LIPITOR) 40 MG tablet TAKE ONE (1) TABLET EACH DAY  . cetirizine (ZYRTEC) 10 MG tablet TAKE 1 TABLET DAILY AS NEEDED FOR ALLERGIES  . cholecalciferol (VITAMIN D) 1000 UNITS tablet Take 1,000 Units by mouth daily.  Marland Kitchen esomeprazole (NEXIUM) 40 MG capsule TAKE ONE (1) CAPSULE EACH DAY  . predniSONE (DELTASONE) 10 MG tablet TAKE 1 TABLET EVERY MORNING WITH BREAKFAST  . meloxicam (MOBIC) 7.5 MG tablet TAKE ONE (1) TABLET EACH DAY  . methocarbamol (ROBAXIN) 500 MG tablet Take 1 tablet (500 mg total) by mouth every 8 (eight) hours as needed for muscle spasms.   No facility-administered encounter medications on file as of  06/30/2017.     Allergies (verified) Dilaudid [hydromorphone hcl]; Lisinopril; and Valium [diazepam]   History: Past Medical History:  Diagnosis Date  . Arthritis   . Hypercholesteremia   . Hypertension   . Pneumonia   . Reflux    Past Surgical History:  Procedure Laterality Date  . APPENDECTOMY    . TOTAL HIP ARTHROPLASTY    . TUBAL LIGATION     Family History  Problem Relation Age of Onset  . Diabetes Mother   . Diabetes Sister   . Healthy Daughter   . Healthy Son   . Cancer Brother   . Healthy Daughter    Social History   Socioeconomic History  . Marital status: Widowed    Spouse name: Not on file  . Number of children: 4  . Years of education: 36  . Highest education level: 11th grade  Occupational History  . 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 Financial resource strain: Not very hard  . Food insecurity:    Worry: Never true    Inability: Never true  . Transportation needs:    Medical: No    Non-medical: No  Tobacco Use  . Smoking status: Former Smoker    Packs/day: 0.25    Years: 5.00    Pack years: 1.25    Types: Cigarettes    Last attempt to quit: 06/30/1977  Years since quitting: 40.0  . Smokeless tobacco: Never Used  Substance and Sexual Activity  . Alcohol use: No  . Drug use: No  . Sexual activity: Not Currently  Lifestyle  . Physical activity:    Days per week: 7 days    Minutes per session: 90 min  . Stress: Not at all  Relationships  . Social connections:    Talks on phone: More than three times a week    Gets together: More than three times a week    Attends religious service: Never    Active member of club or organization: No    Attends meetings of clubs or organizations: Never    Relationship status: Widowed  Other Topics Concern  . Not on file  Social History Narrative  . Not on file    Tobacco Counseling No tobacco use  Clinical Intake:    Pain :  No/denies pain  Nutritional Status: BMI of 19-24  Normal  How often do you need to have someone help you when you read instructions, pamphlets, or other written materials from your doctor or pharmacy?: 1 - Never What is the last grade level you completed in school?: 11th  Interpreter Needed?: No  Information entered by :: Chong Sicilian, RN   Activities of Daily Living In your present state of health, do you have any difficulty performing the following activities: 06/30/2017  Hearing? N  Vision? N  Comment Eye exam scheduled for 08/04/17  Difficulty concentrating or making decisions? N  Walking or climbing stairs? N  Dressing or bathing? N  Doing errands, shopping? N  Preparing Food and eating ? N  Using the Toilet? N  In the past six months, have you accidently leaked urine? N  Do you have problems with loss of bowel control? N  Managing your Medications? N  Managing your Finances? N  Housekeeping or managing your Housekeeping? N  Some recent data might be hidden     Immunizations and Health Maintenance Immunization History  Administered Date(s) Administered  . Td 11/05/2003   Health Maintenance Due  Topic Date Due  . DEXA SCAN  08/22/2006  . COLON CANCER SCREENING ANNUAL FOBT  11/18/2016    Patient Care Team: Theodoro Clock as PCP - General (General Practice)  No hospitalizations, ER visits, or surgeries this past year.       Assessment:   This is a routine wellness examination for Robin [name].  Hearing/Vision screen No deficits noted during visit.  Dietary issues and exercise activities discussed:  Diet  Eats 3 meals a day. Normally eats at home but will go out on occasion.   Current Exercise Habits: The patient does not participate in regular exercise at present(stays busy around home and garden and watching great grandson), Exercise limited by: None identified  Goals    . Exercise 150 min/wk Moderate Activity      Depression Screen PHQ 2/9 Scores  06/30/2017 06/21/2017 05/25/2017 12/22/2016 09/21/2016 05/18/2016 02/18/2016  PHQ - 2 Score 0 0 0 0 0 0 0    Fall Risk Fall Risk  06/30/2017 06/21/2017 05/25/2017 12/22/2016 09/21/2016  Falls in the past year? Yes Yes Yes No No  Comment tripped over uneven cement in parking lot - - - -  Number falls in past yr: 1 1 1  - -  Injury with Fall? Yes Yes Yes - -  Comment brought toes on left foot, concussion - - - -  Risk Factor Category  High Fall Risk - - - -  Risk for fall due to : History of fall(s) - - - -  Follow up Falls prevention discussed - - - -   Has daughter help her get in and of the shower  Has a shower seat  Is the patient's home free of loose throw rugs in walkways, pet beds, electrical cords, etc?   yes      Grab bars in the bathroom? no      Handrails on the stairs?   yes      Adequate lighting?   yes   Cognitive Function: MMSE - Mini Mental State Exam 06/30/2017  Orientation to time 5  Orientation to Place 5  Registration 3  Attention/ Calculation 5  Recall 2  Language- name 2 objects 2  Language- repeat 1  Language- follow 3 step command 3  Language- read & follow direction 1  Write a sentence 1  Copy design 1  Total score 29        Screening Tests Health Maintenance  Topic Date Due  . DEXA SCAN  08/22/2006  . COLON CANCER SCREENING ANNUAL FOBT  11/18/2016  . TETANUS/TDAP  07/01/2018 (Originally 11/04/2013)  . PNA vac Low Risk Adult (1 of 2 - PCV13) 07/01/2018 (Originally 08/22/2006)  . INFLUENZA VACCINE  09/08/2017  . COLONOSCOPY  Discontinued   Cancer Screenings: Lung: Low Dose CT Chest recommended if Age 25-80 years, 30 pack-year currently smoking OR have quit w/in 15years. Patient does not qualify. Breast: Up to date on Mammogram? No indicated Up to date of Bone Density/Dexa? No. [facility] more than two years ago Colorectal: FOBT given today    Plan:  FOBT given  Order for dexa scan placed. Would like to have it at her next appt.  Keep f/u  with PCP Move carefully to avoid falls.  Continue to stay active. Aim for at least 150 min a week.   I have personally reviewed and noted the following in the patient's chart:   . Medical and social history . Use of alcohol, tobacco or illicit drugs  . Current medications and supplements . Functional ability and status . Nutritional status . Physical activity . Advanced directives . List of other physicians . Hospitalizations, surgeries, and ER visits in previous 12 months . Vitals . Screenings to include cognitive, depression, and falls . Referrals and appointments  In addition, I have reviewed and discussed with patient certain preventive protocols, quality metrics, and best practice recommendations. A written personalized care plan for preventive services as well as general preventive health recommendations were provided to patient.     Chong Sicilian, RN   06/30/17    I have reviewed and agree with the above AWV documentation.   Terald Sleeper PA-C [facility] [address]  [place], [place] [address] [phone]

## 2017-07-27 ENCOUNTER — Ambulatory Visit: Payer: Medicare Other | Admitting: Physician Assistant

## 2017-08-03 DIAGNOSIS — H2513 Age-related nuclear cataract, bilateral: Secondary | ICD-10-CM | POA: Diagnosis not present

## 2017-08-10 ENCOUNTER — Other Ambulatory Visit: Payer: Self-pay | Admitting: Physician Assistant

## 2017-08-10 DIAGNOSIS — I10 Essential (primary) hypertension: Secondary | ICD-10-CM

## 2017-08-11 ENCOUNTER — Emergency Department (HOSPITAL_COMMUNITY)
Admission: EM | Admit: 2017-08-11 | Discharge: 2017-08-12 | Disposition: A | Discharge status: Home / Self Care | Payer: Medicare Other | Attending: Emergency Medicine | Admitting: Emergency Medicine

## 2017-08-11 ENCOUNTER — Encounter (HOSPITAL_COMMUNITY): Payer: Self-pay

## 2017-08-11 ENCOUNTER — Emergency Department (HOSPITAL_COMMUNITY): Payer: Medicare Other

## 2017-08-11 ENCOUNTER — Other Ambulatory Visit: Payer: Self-pay

## 2017-08-11 DIAGNOSIS — Z87891 Personal history of nicotine dependence: Secondary | ICD-10-CM | POA: Diagnosis not present

## 2017-08-11 DIAGNOSIS — I10 Essential (primary) hypertension: Secondary | ICD-10-CM | POA: Diagnosis not present

## 2017-08-11 DIAGNOSIS — Z79899 Other long term (current) drug therapy: Secondary | ICD-10-CM | POA: Insufficient documentation

## 2017-08-11 DIAGNOSIS — S298XXA Other specified injuries of thorax, initial encounter: Secondary | ICD-10-CM

## 2017-08-11 DIAGNOSIS — R079 Chest pain, unspecified: Secondary | ICD-10-CM | POA: Insufficient documentation

## 2017-08-11 DIAGNOSIS — S299XXA Unspecified injury of thorax, initial encounter: Secondary | ICD-10-CM | POA: Diagnosis not present
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
## 2017-08-16 ENCOUNTER — Encounter: Payer: Self-pay | Admitting: Physician Assistant

## 2017-08-16 ENCOUNTER — Ambulatory Visit (INDEPENDENT_AMBULATORY_CARE_PROVIDER_SITE_OTHER): Payer: Medicare Other | Admitting: Physician Assistant

## 2017-08-16 VITALS — BP 118/68 | HR 76 | Temp 97.4°F | Ht 60.0 in | Wt 127.6 lb

## 2017-08-16 DIAGNOSIS — E785 Hyperlipidemia, unspecified: Secondary | ICD-10-CM | POA: Diagnosis not present

## 2017-08-16 DIAGNOSIS — R5383 Other fatigue: Secondary | ICD-10-CM | POA: Diagnosis not present

## 2017-08-16 DIAGNOSIS — R0789 Other chest pain: Secondary | ICD-10-CM

## 2017-08-16 DIAGNOSIS — M545 Low back pain, unspecified: Secondary | ICD-10-CM

## 2017-08-16 MED ORDER — METHYLPREDNISOLONE ACETATE 80 MG/ML IJ SUSP
80.0000 mg | Freq: Once | INTRAMUSCULAR | Status: AC
  Administered 2017-08-16: 80 mg via INTRAMUSCULAR
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
## 2017-08-17 LAB — CBC WITH DIFFERENTIAL/PLATELET
BASOS ABS: 0 10*3/uL (ref 0.0–0.2)
Basos: 0 %
EOS (ABSOLUTE): 0.2 10*3/uL (ref 0.0–0.4)
Eos: 3 %
HEMATOCRIT: 37.3 % (ref 34.0–46.6)
Hemoglobin: 12.4 g/dL (ref 11.1–15.9)
Immature Grans (Abs): 0 10*3/uL (ref 0.0–0.1)
Immature Granulocytes: 1 %
Lymphocytes Absolute: 1.8 10*3/uL (ref 0.7–3.1)
Lymphs: 23 %
MCH: 27.6 pg (ref 26.6–33.0)
MCHC: 33.2 g/dL (ref 31.5–35.7)
MCV: 83 fL (ref 79–97)
MONOS ABS: 0.7 10*3/uL (ref 0.1–0.9)
Monocytes: 10 %
NEUTROS ABS: 4.9 10*3/uL (ref 1.4–7.0)
Neutrophils: 63 %
Platelets: 290 10*3/uL (ref 150–450)
RBC: 4.5 x10E6/uL (ref 3.77–5.28)
RDW: 15.7 % — AB (ref 12.3–15.4)
WBC: 7.7 10*3/uL (ref 3.4–10.8)

## 2017-08-17 LAB — LIPID PANEL
CHOL/HDL RATIO: 2.3 ratio (ref 0.0–4.4)
Cholesterol, Total: 212 mg/dL — ABNORMAL HIGH (ref 100–199)
HDL: 94 mg/dL (ref >39)
LDL Calculated: 99 mg/dL (ref 0–99)
Triglycerides: 95 mg/dL (ref 0–149)
VLDL Cholesterol Cal: 19 mg/dL (ref 5–40)

## 2017-08-17 LAB — CMP14+EGFR
ALK PHOS: 124 IU/L — AB (ref 39–117)
ALT: 14 IU/L (ref 0–32)
AST: 16 IU/L (ref 0–40)
Albumin/Globulin Ratio: 2 (ref 1.2–2.2)
Albumin: 4.1 g/dL (ref 3.5–4.8)
BILIRUBIN TOTAL: 0.7 mg/dL (ref 0.0–1.2)
BUN/Creatinine Ratio: 9 — ABNORMAL LOW (ref 12–28)
BUN: 13 mg/dL (ref 8–27)
CO2: 24 mmol/L (ref 20–29)
Calcium: 9.5 mg/dL (ref 8.7–10.3)
Chloride: 103 mmol/L (ref 96–106)
Creatinine, Ser: 1.41 mg/dL — ABNORMAL HIGH (ref 0.57–1.00)
GFR, EST AFRICAN AMERICAN: 42 mL/min/{1.73_m2} — AB (ref >59)
GFR, EST NON AFRICAN AMERICAN: 36 mL/min/{1.73_m2} — AB (ref >59)
GLOBULIN, TOTAL: 2.1 g/dL (ref 1.5–4.5)
GLUCOSE: 92 mg/dL (ref 65–99)
POTASSIUM: 4.4 mmol/L (ref 3.5–5.2)
SODIUM: 143 mmol/L (ref 134–144)
Total Protein: 6.2 g/dL (ref 6.0–8.5)

## 2017-08-17 LAB — MICROALBUMIN / CREATININE URINE RATIO
Creatinine, Urine: 78.7 mg/dL
MICROALB/CREAT RATIO: 14.1 mg/g{creat} (ref 0.0–30.0)
MICROALBUM., U, RANDOM: 11.1 ug/mL

## 2017-08-17 LAB — TSH: TSH: 3.63 u[IU]/mL (ref 0.450–4.500)

## 2017-08-23 ENCOUNTER — Other Ambulatory Visit: Payer: Self-pay | Admitting: Physician Assistant

## 2017-08-23 DIAGNOSIS — M545 Low back pain, unspecified: Secondary | ICD-10-CM

## 2017-08-23 MED ORDER — PREDNISONE 10 MG PO TABS: 10.0000 mg | ORAL_TABLET | Freq: Every day | ORAL | 1 refills | Status: DC

## 2017-09-15 ENCOUNTER — Other Ambulatory Visit: Payer: Self-pay | Admitting: Physician Assistant

## 2017-11-14 DIAGNOSIS — H40013 Open angle with borderline findings, low risk, bilateral: Secondary | ICD-10-CM | POA: Diagnosis not present

## 2017-11-14 DIAGNOSIS — H52222 Regular astigmatism, left eye: Secondary | ICD-10-CM | POA: Diagnosis not present

## 2017-11-14 DIAGNOSIS — H2512 Age-related nuclear cataract, left eye: Secondary | ICD-10-CM | POA: Diagnosis not present

## 2017-11-14 DIAGNOSIS — H353131 Nonexudative age-related macular degeneration, bilateral, early dry stage: Secondary | ICD-10-CM | POA: Diagnosis not present

## 2017-11-14 DIAGNOSIS — H2513 Age-related nuclear cataract, bilateral: Secondary | ICD-10-CM | POA: Diagnosis not present

## 2017-12-15 ENCOUNTER — Encounter (HOSPITAL_COMMUNITY)
Admission: RE | Admit: 2017-12-15 | Discharge: 2017-12-15 | Disposition: A | Discharge status: Home / Self Care | Payer: Medicare Other | Source: Ambulatory Visit | Attending: Ophthalmology | Admitting: Ophthalmology

## 2017-12-15 ENCOUNTER — Encounter (HOSPITAL_COMMUNITY): Payer: Self-pay

## 2017-12-19 ENCOUNTER — Encounter (HOSPITAL_COMMUNITY): Payer: Self-pay

## 2017-12-19 ENCOUNTER — Ambulatory Visit (HOSPITAL_COMMUNITY): Payer: Medicare Other | Admitting: Anesthesiology

## 2017-12-19 ENCOUNTER — Encounter (HOSPITAL_COMMUNITY)
Admission: RE | Disposition: A | Discharge status: Home / Self Care | Payer: Self-pay | Source: Ambulatory Visit | Attending: Ophthalmology

## 2017-12-19 ENCOUNTER — Ambulatory Visit (HOSPITAL_COMMUNITY)
Admission: RE | Admit: 2017-12-19 | Discharge: 2017-12-19 | Disposition: A | Discharge status: Home / Self Care | Payer: Medicare Other | Source: Ambulatory Visit | Attending: Ophthalmology | Admitting: Ophthalmology

## 2017-12-19 DIAGNOSIS — H2512 Age-related nuclear cataract, left eye: Secondary | ICD-10-CM | POA: Insufficient documentation

## 2017-12-19 DIAGNOSIS — Z79899 Other long term (current) drug therapy: Secondary | ICD-10-CM | POA: Insufficient documentation

## 2017-12-19 DIAGNOSIS — K219 Gastro-esophageal reflux disease without esophagitis: Secondary | ICD-10-CM | POA: Diagnosis not present

## 2017-12-19 DIAGNOSIS — F419 Anxiety disorder, unspecified: Secondary | ICD-10-CM | POA: Diagnosis not present

## 2017-12-19 DIAGNOSIS — I10 Essential (primary) hypertension: Secondary | ICD-10-CM | POA: Insufficient documentation

## 2017-12-19 DIAGNOSIS — Z87891 Personal history of nicotine dependence: Secondary | ICD-10-CM | POA: Insufficient documentation

## 2017-12-19 HISTORY — PX: CATARACT EXTRACTION W/PHACO: SHX586

## 2017-12-19 SURGERY — PHACOEMULSIFICATION, CATARACT, WITH IOL INSERTION
Anesthesia: Monitor Anesthesia Care | Site: Eye | Laterality: Left

## 2017-12-19 MED ORDER — TETRACAINE HCL 0.5 % OP SOLN
1.0000 [drp] | OPHTHALMIC | Status: AC | PRN
  Administered 2017-12-19 (×3): 1 [drp] via OPHTHALMIC

## 2017-12-19 MED ORDER — CYCLOPENTOLATE-PHENYLEPHRINE 0.2-1 % OP SOLN
1.0000 [drp] | OPHTHALMIC | Status: AC | PRN
  Administered 2017-12-19 (×3): 1 [drp] via OPHTHALMIC

## 2017-12-19 MED ORDER — NEOMYCIN-POLYMYXIN-DEXAMETH 3.5-10000-0.1 OP SUSP
OPHTHALMIC | Status: DC | PRN
  Administered 2017-12-19: 2 [drp] via OPHTHALMIC

## 2017-12-19 MED ORDER — LIDOCAINE HCL 3.5 % OP GEL
1.0000 "application " | Freq: Once | OPHTHALMIC | Status: AC
  Administered 2017-12-19: 1 via OPHTHALMIC

## 2017-12-19 MED ORDER — LIDOCAINE HCL (PF) 1 % IJ SOLN
INTRAMUSCULAR | Status: DC | PRN
  Administered 2017-12-19: .5 mL

## 2017-12-19 MED ORDER — LACTATED RINGERS IV SOLN
INTRAVENOUS | Status: DC
  Administered 2017-12-19: 13:00:00 via INTRAVENOUS

## 2017-12-19 MED ORDER — POVIDONE-IODINE 5 % OP SOLN
OPHTHALMIC | Status: DC | PRN
  Administered 2017-12-19: 1 via OPHTHALMIC

## 2017-12-19 MED ORDER — EPINEPHRINE PF 1 MG/ML IJ SOLN
INTRAOCULAR | Status: DC | PRN
  Administered 2017-12-19: 500 mL

## 2017-12-19 MED ORDER — PROVISC 10 MG/ML IO SOLN
INTRAOCULAR | Status: DC | PRN
  Administered 2017-12-19: 0.85 mL via INTRAOCULAR

## 2017-12-19 MED ORDER — MIDAZOLAM HCL 2 MG/2ML IJ SOLN
INTRAMUSCULAR | Status: AC
  Filled 2017-12-19: qty 2

## 2017-12-19 MED ORDER — BSS IO SOLN
INTRAOCULAR | Status: DC | PRN
  Administered 2017-12-19: 15 mL via INTRAOCULAR

## 2017-12-19 MED ORDER — PHENYLEPHRINE HCL 2.5 % OP SOLN
1.0000 [drp] | OPHTHALMIC | Status: AC | PRN
  Administered 2017-12-19 (×3): 1 [drp] via OPHTHALMIC

## 2017-12-19 SURGICAL SUPPLY — 12 items
CLOTH BEACON ORANGE TIMEOUT ST (SAFETY) ×1 IMPLANT
EYE SHIELD UNIVERSAL CLEAR (GAUZE/BANDAGES/DRESSINGS) ×1 IMPLANT
GLOVE BIOGEL PI IND STRL 7.0 (GLOVE) IMPLANT
GLOVE BIOGEL PI INDICATOR 7.0 (GLOVE) ×2
LENS ALC ACRYL/TECN (Ophthalmic Related) ×1 IMPLANT
NDL HYPO 18GX1.5 BLUNT FILL (NEEDLE) IMPLANT
NEEDLE HYPO 18GX1.5 BLUNT FILL (NEEDLE) ×2 IMPLANT
PAD ARMBOARD 7.5X6 YLW CONV (MISCELLANEOUS) ×1 IMPLANT
SYRINGE LUER LOK 1CC (MISCELLANEOUS) ×1 IMPLANT
TAPE SURG TRANSPORE 1 IN (GAUZE/BANDAGES/DRESSINGS) IMPLANT
TAPE SURGICAL TRANSPORE 1 IN (GAUZE/BANDAGES/DRESSINGS) ×1
WATER STERILE IRR 250ML POUR (IV SOLUTION) ×1 IMPLANT

## 2017-12-19 NOTE — Anesthesia Postprocedure Evaluation (Signed)
Anesthesia Post Note  Patient: Robin [name]  Procedure(s) Performed: CATARACT EXTRACTION PHACO AND INTRAOCULAR LENS PLACEMENT LEFT EYE (Left Eye)  Patient location during evaluation: Short Stay Anesthesia Type: MAC Level of consciousness: awake and alert and oriented Pain management: pain level controlled Vital Signs Assessment: post-procedure vital signs reviewed and stable Respiratory status: spontaneous breathing Cardiovascular status: stable Postop Assessment: no apparent nausea or vomiting Anesthetic complications: no     Last Vitals:  Vitals:   12/19/17 1139  BP: 131/70  Pulse: 90  Resp: 16  Temp: 36.6 C  SpO2: 95%    Last Pain:  Vitals:   12/19/17 1139  TempSrc: Oral  PainSc: 0-No pain                 ADAMS, AMY A

## 2017-12-19 NOTE — Op Note (Signed)
Date of Admission: 12/19/2017  Date of Surgery: 12/19/2017  Pre-Op Dx: Cataract Left  Eye  Post-Op Dx: Senile Nuclear Cataract  Left  Eye,  Dx Code H25.12  Surgeon: Tonny Branch, M.D.  Assistants: None  Anesthesia: Topical with MAC  Indications: Painless, progressive loss of vision with compromise of daily activities.  Surgery: Cataract Extraction with Intraocular lens Implant Left Eye  Discription: The patient had dilating drops and viscous lidocaine placed into the Left eye in the pre-op holding area. After transfer to the operating room, a time out was performed. The patient was then prepped and draped. Beginning with a 75m blade a paracentesis port was made at the surgeon's 2 o'clock position. The anterior chamber was then filled with 1% non-preserved lidocaine. This was followed by filling the anterior chamber with Provisc.  A 2.46mkeratome blade was used to make a clear corneal incision at the temporal limbus.  A bent cystatome needle was used to create a continuous tear capsulotomy. Hydrodissection was performed with balanced salt solution on a Fine canula. The lens nucleus was then removed using the phacoemulsification handpiece. Residual cortex was removed with the I&A handpiece. The anterior chamber and capsular bag were refilled with Provisc. A posterior chamber intraocular lens was placed into the capsular bag with it's injector. The implant was positioned with the Kuglan hook. The Provisc was then removed from the anterior chamber and capsular bag with the I&A handpiece. Stromal hydration of the main incision and paracentesis port was performed with BSS on a Fine canula. The wounds were tested for leak which was negative. The patient tolerated the procedure well. There were no operative complications. The patient was then transferred to the recovery room in stable condition.  Complications: None  Specimen: None  EBL: None  Prosthetic device: [employer] Technis, PCB00, power 23.5, SN  [id number]

## 2017-12-19 NOTE — H&P (Signed)
I have reviewed the H&P, the patient was re-examined, and I have identified no interval changes in medical condition and plan of care since the history and physical of record  

## 2017-12-19 NOTE — Anesthesia Preprocedure Evaluation (Signed)
Anesthesia Evaluation    Airway Mallampati: II       Dental  (+) Upper Dentures, Lower Dentures   Pulmonary former smoker,    breath sounds clear to auscultation       Cardiovascular hypertension, On Medications  Rhythm:regular     Neuro/Psych PSYCHIATRIC DISORDERS Anxiety    GI/Hepatic GERD  Medicated,  Endo/Other    Renal/GU CRFRenal disease     Musculoskeletal   Abdominal   Peds  Hematology  (+) Blood dyscrasia, anemia ,   Anesthesia Other Findings   Reproductive/Obstetrics                             Anesthesia Physical Anesthesia Plan  ASA: III  Anesthesia Plan: MAC   Post-op Pain Management:    Induction:   PONV Risk Score and Plan:   Airway Management Planned:   Additional Equipment:   Intra-op Plan:   Post-operative Plan:   Informed Consent:   Plan Discussed with: Anesthesiologist  Anesthesia Plan Comments:         Anesthesia Quick Evaluation

## 2017-12-19 NOTE — Anesthesia Procedure Notes (Signed)
Procedure Name: MAC Performed by: Adams, Amy A, CRNA Pre-anesthesia Checklist: Patient identified, Emergency Drugs available, Suction available, Timeout performed and Patient being monitored Patient Re-evaluated:Patient Re-evaluated prior to induction Oxygen Delivery Method: Nasal Cannula       

## 2017-12-19 NOTE — Transfer of Care (Signed)
Immediate Anesthesia Transfer of Care Note  Patient: Robin [name]  Procedure(s) Performed: CATARACT EXTRACTION PHACO AND INTRAOCULAR LENS PLACEMENT LEFT EYE (Left Eye)  Patient Location: Short Stay  Anesthesia Type:MAC  Level of Consciousness: awake, alert , oriented and patient cooperative  Airway & Oxygen Therapy: Patient Spontanous Breathing  Post-op Assessment: Report given to RN and Post -op Vital signs reviewed and stable  Post vital signs: Reviewed and stable  Last Vitals:  Vitals Value Taken Time  BP    Temp    Pulse    Resp    SpO2      Last Pain:  Vitals:   12/19/17 1139  TempSrc: Oral  PainSc: 0-No pain      Patients Stated Pain Goal: 7 (79/39/03 0092)  Complications: No apparent anesthesia complications

## 2017-12-19 NOTE — Discharge Instructions (Signed)

## 2017-12-20 ENCOUNTER — Ambulatory Visit (INDEPENDENT_AMBULATORY_CARE_PROVIDER_SITE_OTHER): Payer: Medicare Other | Admitting: Physician Assistant

## 2017-12-20 ENCOUNTER — Encounter: Payer: Self-pay | Admitting: Physician Assistant

## 2017-12-20 VITALS — BP 113/70 | HR 86 | Temp 97.7°F | Ht 60.0 in | Wt 133.6 lb

## 2017-12-20 DIAGNOSIS — I10 Essential (primary) hypertension: Secondary | ICD-10-CM

## 2017-12-20 DIAGNOSIS — F411 Generalized anxiety disorder: Secondary | ICD-10-CM | POA: Diagnosis not present

## 2017-12-20 DIAGNOSIS — G629 Polyneuropathy, unspecified: Secondary | ICD-10-CM | POA: Diagnosis not present

## 2017-12-20 MED ORDER — HYDROCHLOROTHIAZIDE 25 MG PO TABS: 25.0000 mg | ORAL_TABLET | Freq: Every day | ORAL | 3 refills | Status: DC

## 2017-12-20 MED ORDER — ESOMEPRAZOLE MAGNESIUM 40 MG PO CPDR: 40.0000 mg | DELAYED_RELEASE_CAPSULE | Freq: Every day | ORAL | 3 refills | Status: DC

## 2017-12-20 MED ORDER — CETIRIZINE HCL 10 MG PO TABS: 10.0000 mg | ORAL_TABLET | Freq: Every day | ORAL | 3 refills | Status: DC | PRN

## 2017-12-20 MED ORDER — ATORVASTATIN CALCIUM 40 MG PO TABS: 40.0000 mg | ORAL_TABLET | Freq: Every day | ORAL | 3 refills | Status: DC

## 2017-12-20 MED ORDER — ALPRAZOLAM 0.5 MG PO TABS: 0.2500 mg | ORAL_TABLET | Freq: Every evening | ORAL | 5 refills | Status: DC | PRN

## 2017-12-22 NOTE — Progress Notes (Signed)
BP 113/70   Pulse 86   Temp 97.7 F (36.5 C) (Oral)   Ht 5' (1.524 m)   Wt 133 lb 9.6 oz (60.6 kg)   BMI 26.09 kg/m    Subjective:    Patient ID: Robin [name], female    DOB: 1940/05/18, 76 y.o.   MRN: [id number]  HPI: Robin [name] is a 77 y.o. female presenting on 12/20/2017 for Hypertension (6 month follow up ) and Hyperlipidemia  This patient comes in for periodic recheck on medications and conditions including hypertension, GAD, neuropathy. She take one xanax at night.  NEW narcotic contract is reviewed and signed at today's visit. There are no new complaints at this time.  All medications are reviewed today. There are no reports of any problems with the medications. All of the medical conditions are reviewed and updated.  Lab work is reviewed and will be ordered as medically necessary. There are no new problems reported with today's visit.  Relevant past medical, surgical, family and social history reviewed and updated as indicated. Interim medical history since our last visit reviewed. Allergies and medications reviewed and updated. DATA REVIEWED: CHART IN EPIC  Family History reviewed for pertinent findings.  Review of Systems  Constitutional: Negative.  Negative for activity change, fatigue and fever.  HENT: Negative.   Eyes: Negative.   Respiratory: Negative.  Negative for cough.   Cardiovascular: Negative.  Negative for chest pain.  Gastrointestinal: Negative.  Negative for abdominal pain.  Endocrine: Negative.   Genitourinary: Negative.  Negative for dysuria.  Musculoskeletal: Negative.   Skin: Negative.   Neurological: Negative.     Allergies as of 12/20/2017      Reactions   Dilaudid [hydromorphone Hcl] Nausea And Vomiting   Lisinopril Cough   Valium [diazepam] Other (See Comments)   Family states patient has adverse reaction and displays confusion.       Medication List        Accurate as of 12/20/17 11:59 PM. Always use your most recent med list.          ALPRAZolam 0.5 MG tablet Commonly known as:  XANAX Take 0.5 tablets (0.25 mg total) by mouth at bedtime as needed for anxiety.   amLODipine 10 MG tablet Commonly known as:  NORVASC TAKE ONE (1) TABLET EACH DAY   aspirin 81 MG tablet Take 81 mg by mouth daily.   atorvastatin 40 MG tablet Commonly known as:  LIPITOR Take 1 tablet (40 mg total) by mouth daily.   cetirizine 10 MG tablet Commonly known as:  ZYRTEC Take 1 tablet (10 mg total) by mouth daily as needed for allergies.   cholecalciferol 1000 units tablet Commonly known as:  VITAMIN D Take 1,000 Units by mouth daily.   esomeprazole 40 MG capsule Commonly known as:  NEXIUM Take 1 capsule (40 mg total) by mouth daily.   hydrochlorothiazide 25 MG tablet Commonly known as:  HYDRODIURIL Take 1 tablet (25 mg total) by mouth daily.   SYSTANE 0.4-0.3 % Soln Generic drug:  Polyethyl Glycol-Propyl Glycol Place 1 drop into both eyes daily.          Objective:    BP 113/70   Pulse 86   Temp 97.7 F (36.5 C) (Oral)   Ht 5' (1.524 m)   Wt 133 lb 9.6 oz (60.6 kg)   BMI 26.09 kg/m   Allergies  Allergen Reactions  . Dilaudid [Hydromorphone Hcl] Nausea And Vomiting  . Lisinopril Cough  . Valium [Diazepam] Other (See  Comments)    Family states patient has adverse reaction and displays confusion.     Wt Readings from Last 3 Encounters:  12/20/17 133 lb 9.6 oz (60.6 kg)  08/16/17 127 lb 9.6 oz (57.9 kg)  08/11/17 120 lb (54.4 kg)    Physical Exam  Constitutional: She is oriented to person, place, and time. She appears well-developed and well-nourished.  HENT:  Head: Normocephalic and atraumatic.  Eyes: Pupils are equal, round, and reactive to light. Conjunctivae and EOM are normal.  Cardiovascular: Normal rate, regular rhythm, normal heart sounds and intact distal pulses.  Pulmonary/Chest: Effort normal and breath sounds normal.  Abdominal: Soft. Bowel sounds are normal.  Neurological: She is alert  and oriented to person, place, and time. She has normal reflexes.  Skin: Skin is warm and dry. No rash noted.  Psychiatric: She has a normal mood and affect. Her behavior is normal. Judgment and thought content normal.    Results for orders placed or performed in visit on 08/16/17  CBC with Differential/Platelet  Result Value Ref Range   WBC 7.7 3.4 - 10.8 x10E3/uL   RBC 4.50 3.77 - 5.28 x10E6/uL   Hemoglobin 12.4 11.1 - 15.9 g/dL   Hematocrit 37.3 34.0 - 46.6 %   MCV 83 79 - 97 fL   MCH 27.6 26.6 - 33.0 pg   MCHC 33.2 31.5 - 35.7 g/dL   RDW 15.7 (H) 12.3 - 15.4 %   Platelets 290 150 - 450 x10E3/uL   Neutrophils 63 Not Estab. %   Lymphs 23 Not Estab. %   Monocytes 10 Not Estab. %   Eos 3 Not Estab. %   Basos 0 Not Estab. %   Neutrophils Absolute 4.9 1.4 - 7.0 x10E3/uL   Lymphocytes Absolute 1.8 0.7 - 3.1 x10E3/uL   Monocytes Absolute 0.7 0.1 - 0.9 x10E3/uL   EOS (ABSOLUTE) 0.2 0.0 - 0.4 x10E3/uL   Basophils Absolute 0.0 0.0 - 0.2 x10E3/uL   Immature Granulocytes 1 Not Estab. %   Immature Grans (Abs) 0.0 0.0 - 0.1 x10E3/uL  CMP14+EGFR  Result Value Ref Range   Glucose 92 65 - 99 mg/dL   BUN 13 8 - 27 mg/dL   Creatinine, Ser 1.41 (H) 0.57 - 1.00 mg/dL   GFR calc non Af Amer 36 (L) >59 mL/min/1.73   GFR calc Af Amer 42 (L) >59 mL/min/1.73   BUN/Creatinine Ratio 9 (L) 12 - 28   Sodium 143 134 - 144 mmol/L   Potassium 4.4 3.5 - 5.2 mmol/L   Chloride 103 96 - 106 mmol/L   CO2 24 20 - 29 mmol/L   Calcium 9.5 8.7 - 10.3 mg/dL   Total Protein 6.2 6.0 - 8.5 g/dL   Albumin 4.1 3.5 - 4.8 g/dL   Globulin, Total 2.1 1.5 - 4.5 g/dL   Albumin/Globulin Ratio 2.0 1.2 - 2.2   Bilirubin Total 0.7 0.0 - 1.2 mg/dL   Alkaline Phosphatase 124 (H) 39 - 117 IU/L   AST 16 0 - 40 IU/L   ALT 14 0 - 32 IU/L  Lipid panel  Result Value Ref Range   Cholesterol, Total 212 (H) 100 - 199 mg/dL   Triglycerides 95 0 - 149 mg/dL   HDL 94 >39 mg/dL   VLDL Cholesterol Cal 19 5 - 40 mg/dL   LDL  Calculated 99 0 - 99 mg/dL   Chol/HDL Ratio 2.3 0.0 - 4.4 ratio  TSH  Result Value Ref Range   TSH 3.630 0.450 - 4.500  uIU/mL  Microalbumin / creatinine urine ratio  Result Value Ref Range   Creatinine, Urine 78.7 Not Estab. mg/dL   Microalbumin, Urine 11.1 Not Estab. ug/mL   Microalb/Creat Ratio 14.1 0.0 - 30.0 mg/g creat      Assessment & Plan:   1. Essential hypertension - hydrochlorothiazide (HYDRODIURIL) 25 MG tablet; Take 1 tablet (25 mg total) by mouth daily.  Dispense: 90 tablet; Refill: 3  2. GAD (generalized anxiety disorder) - ALPRAZolam (XANAX) 0.5 MG tablet; Take 0.5 tablets (0.25 mg total) by mouth at bedtime as needed for anxiety.  Dispense: 30 tablet; Refill: 5  3. Neuropathy Continue medications   Continue all other maintenance medications as listed above.  Follow up plan: Return in about 4 weeks (around 01/17/2018) for recheck.  Educational handout given for Moore PA-C [facility] [address]  [place], [place] [address] [phone]   12/22/2017, 11:03 PM

## 2017-12-25 DIAGNOSIS — H25811 Combined forms of age-related cataract, right eye: Secondary | ICD-10-CM | POA: Diagnosis not present

## 2017-12-29 ENCOUNTER — Encounter (HOSPITAL_COMMUNITY)
Admission: RE | Admit: 2017-12-29 | Discharge: 2017-12-29 | Disposition: A | Discharge status: Home / Self Care | Payer: Medicare Other | Source: Ambulatory Visit | Attending: Ophthalmology | Admitting: Ophthalmology

## 2018-01-01 ENCOUNTER — Encounter (HOSPITAL_COMMUNITY): Payer: Self-pay

## 2018-01-01 ENCOUNTER — Other Ambulatory Visit: Payer: Self-pay

## 2018-01-01 ENCOUNTER — Emergency Department (HOSPITAL_COMMUNITY)
Admission: EM | Admit: 2018-01-01 | Discharge: 2018-01-01 | Disposition: A | Discharge status: Home / Self Care | Payer: Medicare Other | Attending: Emergency Medicine | Admitting: Emergency Medicine

## 2018-01-01 ENCOUNTER — Emergency Department (HOSPITAL_COMMUNITY): Payer: Medicare Other

## 2018-01-01 DIAGNOSIS — R0602 Shortness of breath: Secondary | ICD-10-CM | POA: Insufficient documentation

## 2018-01-01 DIAGNOSIS — J209 Acute bronchitis, unspecified: Secondary | ICD-10-CM | POA: Insufficient documentation

## 2018-01-01 DIAGNOSIS — N183 Chronic kidney disease, stage 3 (moderate): Secondary | ICD-10-CM | POA: Insufficient documentation

## 2018-01-01 DIAGNOSIS — R079 Chest pain, unspecified: Secondary | ICD-10-CM | POA: Diagnosis not present

## 2018-01-01 DIAGNOSIS — Z79899 Other long term (current) drug therapy: Secondary | ICD-10-CM | POA: Diagnosis not present

## 2018-01-01 DIAGNOSIS — I129 Hypertensive chronic kidney disease with stage 1 through stage 4 chronic kidney disease, or unspecified chronic kidney disease: Secondary | ICD-10-CM | POA: Diagnosis not present

## 2018-01-01 DIAGNOSIS — R0789 Other chest pain: Secondary | ICD-10-CM | POA: Diagnosis present

## 2018-01-01 DIAGNOSIS — Z87891 Personal history of nicotine dependence: Secondary | ICD-10-CM | POA: Insufficient documentation

## 2018-01-01 DIAGNOSIS — Z7982 Long term (current) use of aspirin: Secondary | ICD-10-CM | POA: Insufficient documentation

## 2018-01-01 DIAGNOSIS — R05 Cough: Secondary | ICD-10-CM | POA: Diagnosis not present

## 2018-01-01 DIAGNOSIS — R600 Localized edema: Secondary | ICD-10-CM | POA: Insufficient documentation

## 2018-01-01 DIAGNOSIS — Z85828 Personal history of other malignant neoplasm of skin: Secondary | ICD-10-CM | POA: Insufficient documentation

## 2018-01-01 LAB — BASIC METABOLIC PANEL
Anion gap: 7 (ref 5–15)
BUN: 14 mg/dL (ref 8–23)
CALCIUM: 8.7 mg/dL — AB (ref 8.9–10.3)
CO2: 26 mmol/L (ref 22–32)
CREATININE: 1.27 mg/dL — AB (ref 0.44–1.00)
Chloride: 109 mmol/L (ref 98–111)
GFR calc Af Amer: 46 mL/min — ABNORMAL LOW (ref >60)
GFR calc non Af Amer: 40 mL/min — ABNORMAL LOW (ref >60)
Glucose, Bld: 90 mg/dL (ref 70–99)
Potassium: 3.5 mmol/L (ref 3.5–5.1)
SODIUM: 142 mmol/L (ref 135–145)

## 2018-01-01 LAB — CBC
HCT: 38.8 % (ref 36.0–46.0)
Hemoglobin: 11.5 g/dL — ABNORMAL LOW (ref 12.0–15.0)
MCH: 27.2 pg (ref 26.0–34.0)
MCHC: 29.6 g/dL — AB (ref 30.0–36.0)
MCV: 91.7 fL (ref 80.0–100.0)
PLATELETS: 286 10*3/uL (ref 150–400)
RBC: 4.23 MIL/uL (ref 3.87–5.11)
RDW: 15.3 % (ref 11.5–15.5)
WBC: 5.2 10*3/uL (ref 4.0–10.5)
nRBC: 0 % (ref 0.0–0.2)

## 2018-01-01 LAB — LACTIC ACID, PLASMA
LACTIC ACID, VENOUS: 1.1 mmol/L (ref 0.5–1.9)
Lactic Acid, Venous: 0.8 mmol/L (ref 0.5–1.9)

## 2018-01-01 LAB — TROPONIN I

## 2018-01-01 LAB — BRAIN NATRIURETIC PEPTIDE: B Natriuretic Peptide: 51 pg/mL (ref 0.0–100.0)

## 2018-01-01 MED ORDER — AZITHROMYCIN 250 MG PO TABS
500.0000 mg | ORAL_TABLET | Freq: Once | ORAL | Status: AC
  Administered 2018-01-01: 500 mg via ORAL
  Filled 2018-01-01: qty 2

## 2018-01-01 MED ORDER — ALBUTEROL SULFATE HFA 108 (90 BASE) MCG/ACT IN AERS
2.0000 | INHALATION_SPRAY | Freq: Once | RESPIRATORY_TRACT | Status: AC
  Administered 2018-01-01: 2 via RESPIRATORY_TRACT
  Filled 2018-01-01: qty 6.7

## 2018-01-01 MED ORDER — PREDNISONE 50 MG PO TABS
60.0000 mg | ORAL_TABLET | Freq: Once | ORAL | Status: AC
  Administered 2018-01-01: 60 mg via ORAL
  Filled 2018-01-01: qty 1

## 2018-01-01 MED ORDER — ONDANSETRON HCL 4 MG/2ML IJ SOLN
4.0000 mg | Freq: Once | INTRAMUSCULAR | Status: AC
  Administered 2018-01-01: 4 mg via INTRAVENOUS
  Filled 2018-01-01: qty 2

## 2018-01-01 MED ORDER — PREDNISONE 20 MG PO TABS: 60.0000 mg | ORAL_TABLET | Freq: Every day | ORAL | 0 refills | Status: DC

## 2018-01-01 MED ORDER — IPRATROPIUM-ALBUTEROL 0.5-2.5 (3) MG/3ML IN SOLN
3.0000 mL | Freq: Once | RESPIRATORY_TRACT | Status: AC
  Administered 2018-01-01: 3 mL via RESPIRATORY_TRACT
  Filled 2018-01-01: qty 3

## 2018-01-01 MED ORDER — AZITHROMYCIN 250 MG PO TABS: 250.0000 mg | ORAL_TABLET | Freq: Every day | ORAL | 0 refills | Status: DC

## 2018-01-01 NOTE — Discharge Instructions (Addendum)
You were evaluated in the emergency department for chest tightness and neck rattling cough.  You had blood work EKG and chest x-ray that did not show an obvious cause of your symptoms.  You likely have some bronchitis and improved after some breathing treatments.  We are putting you on an antibiotic and some steroids and given you an inhaler.  You should use the inhaler 2 puffs every 6 hours as needed.  Please keep well-hydrated and follow-up with your doctor.  Return if any worsening symptoms.

## 2018-01-01 NOTE — ED Notes (Signed)
Pt vomiting,  EDP notified

## 2018-01-01 NOTE — ED Triage Notes (Addendum)
Pt reports chest pain, productive cough, and SOB that started about 3 days ago. Pt denies fever. Pt reports the mucous that she is coughing up is thick, stringy, and "almost chokes" her trying to get it up. Also reports feeling a "rattle in chest". Pt reports taking two penicillin pills that family member gave her.

## 2018-01-01 NOTE — ED Provider Notes (Signed)
[facility] EMERGENCY DEPARTMENT Provider Note   CSN: [id number] Arrival date & time: 01/01/18  3220     History   Chief Complaint Chief Complaint  Patient presents with  . Chest Pain    HPI Robin [name] is a 77 y.o. female.  She is here with 3 days of tightness in her chest along with a productive cough with [place] sputum.  She said she is coughing up some [place] stuff she feels like she is choking on it and cannot breathe.  She has not been able to lie flat.  No fevers no chills.  Decreased appetite.  She has tried nothing for it.  It seems to be worsening over the last day.  The history is provided by the patient and a relative.  Shortness of Breath  This is a new problem. The average episode lasts 3 days. The current episode started more than 2 days ago. The problem has been gradually worsening. Associated symptoms include cough, sputum production, wheezing and chest pain (pressure). Pertinent negatives include no fever, no headaches, no rhinorrhea, no sore throat, no neck pain, no hemoptysis, no syncope, no vomiting, no abdominal pain, no rash, no leg pain and no leg swelling. It is unknown what precipitated the problem. She has tried nothing for the symptoms. The treatment provided no relief. Associated medical issues include pneumonia. Associated medical issues do not include CAD, heart failure, past MI, DVT or recent surgery.    Past Medical History:  Diagnosis Date  . Arthritis   . Hypercholesteremia   . Hypertension   . Pneumonia   . Reflux     Patient Active Problem List   Diagnosis Date Noted  . GAD (generalized anxiety disorder) 06/21/2017  . Toe deformity, acquired, left 12/22/2016  . Plantar fasciitis of left foot 12/22/2016  . Acute right-sided low back pain without sciatica 09/21/2016  . 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 Basal cell carcinoma of skin of left ear and external auricular canal 06/24/2016  . Anemia, iron  deficiency 02/18/2016  . Hyperlipidemia 11/17/2015  . Essential hypertension 11/17/2015  . Osteoarthritis of left hip 11/17/2015  . Chronic kidney disease (CKD), stage III (moderate) ([place]) 03/15/2014  . Gastro-esophageal reflux disease without esophagitis 03/15/2014  . Calculus of kidney 10/04/2012    Past Surgical History:  Procedure Laterality Date  . APPENDECTOMY    . CATARACT EXTRACTION W/PHACO Left 12/19/2017   Procedure: CATARACT EXTRACTION PHACO AND INTRAOCULAR LENS PLACEMENT LEFT EYE;  Surgeon: Tonny Branch, MD;  Location: [facility];  Service: Ophthalmology;  Laterality: Left;  CDE: 26.71  . TOTAL HIP ARTHROPLASTY    . TUBAL LIGATION       OB History   None      Home Medications    Prior to Admission medications   Medication Sig Start Date End Date Taking? Authorizing Provider  ALPRAZolam Duanne Moron) 0.5 MG tablet Take 0.5 tablets (0.25 mg total) by mouth at bedtime as needed for anxiety. 12/20/17   Terald Sleeper, PA-C  amLODipine (NORVASC) 10 MG tablet TAKE ONE (1) TABLET EACH DAY Patient taking differently: Take 10 mg by mouth daily.  08/12/17   Terald Sleeper, PA-C  aspirin 81 MG tablet Take 81 mg by mouth daily.    [provider]  atorvastatin (LIPITOR) 40 MG tablet Take 1 tablet (40 mg total) by mouth daily. 12/20/17   Terald Sleeper, PA-C  cetirizine (ZYRTEC) 10 MG tablet Take 1 tablet (10  mg total) by mouth daily as needed for allergies. 12/20/17   Terald Sleeper, PA-C  cholecalciferol (VITAMIN D) 1000 UNITS tablet Take 1,000 Units by mouth daily.    [provider]  esomeprazole (NEXIUM) 40 MG capsule Take 1 capsule (40 mg total) by mouth daily. 12/20/17   Terald Sleeper, PA-C  hydrochlorothiazide (HYDRODIURIL) 25 MG tablet Take 1 tablet (25 mg total) by mouth daily. 12/20/17   Terald Sleeper, PA-C  Polyethyl Glycol-Propyl Glycol (SYSTANE) 0.4-0.3 % SOLN Place 1 drop into both eyes daily.    [provider]    Family History Family History    Problem Relation Age of Onset  . Diabetes Mother   . Diabetes Sister   . Healthy Daughter   . Healthy Son   . Cancer Brother   . Healthy Daughter     Social History Social History   Tobacco Use  . Smoking status: Former Smoker    Packs/day: 0.25    Years: 5.00    Pack years: 1.25    Types: Cigarettes    Last attempt to quit: 06/30/1977    Years since quitting: 40.5  . Smokeless tobacco: Never Used  Substance Use Topics  . Alcohol use: No  . Drug use: No     Allergies   Dilaudid [hydromorphone hcl]; Lisinopril; and Valium [diazepam]   Review of Systems Review of Systems  Constitutional: Negative for fever.  HENT: Negative for rhinorrhea and sore throat.   Eyes: Negative for visual disturbance.  Respiratory: Positive for cough, sputum production, shortness of breath and wheezing. Negative for hemoptysis.   Cardiovascular: Positive for chest pain (pressure). Negative for leg swelling and syncope.  Gastrointestinal: Negative for abdominal pain and vomiting.  Genitourinary: Negative for dysuria.  Musculoskeletal: Negative for neck pain.  Skin: Negative for rash.  Neurological: Negative for headaches.     Physical Exam Updated Vital Signs BP 126/61 (BP Location: Left Arm)   Pulse 66   Temp 98 F (36.7 C) (Oral)   Resp 18   Ht 5' (1.524 m)   Wt 60.6 kg   SpO2 97%   BMI 26.09 kg/m   Physical Exam  Constitutional: She appears well-developed and well-nourished. No distress.  HENT:  Head: Normocephalic and atraumatic.  Eyes: Conjunctivae are normal.  Neck: Neck supple.  Cardiovascular: Normal rate, regular rhythm and normal heart sounds.  No murmur heard. Pulmonary/Chest: Effort normal. No respiratory distress. She has wheezes (scattered).  Abdominal: Soft. She exhibits no mass. There is no tenderness. There is no guarding.  Musculoskeletal: Normal range of motion. She exhibits no deformity.       Right lower leg: She exhibits edema (trace). She exhibits no  tenderness.       Left lower leg: She exhibits edema (trace). She exhibits no tenderness.  Neurological: She is alert.  Skin: Skin is warm and dry. Capillary refill takes less than 2 seconds.  Psychiatric: She has a normal mood and affect.  Nursing note and vitals reviewed.    ED Treatments / Results  Labs (all labs ordered are listed, but only abnormal results are displayed) Labs Reviewed  BASIC METABOLIC PANEL - Abnormal; Notable for the following components:      Result Value   Creatinine, Ser 1.27 (*)    Calcium 8.7 (*)    GFR calc non Af Amer 40 (*)    GFR calc Af Amer 46 (*)    All other components within normal limits  CBC - Abnormal;  Notable for the following components:   Hemoglobin 11.5 (*)    MCHC 29.6 (*)    All other components within normal limits  CULTURE, BLOOD (ROUTINE X 2)  CULTURE, BLOOD (ROUTINE X 2)  LACTIC ACID, PLASMA  LACTIC ACID, PLASMA  TROPONIN I  BRAIN NATRIURETIC PEPTIDE    EKG EKG Interpretation  Date/Time:  Sunday January 01 2018 06:48:43 EST Ventricular Rate:  69 PR Interval:    QRS Duration: 92 QT Interval:  380 QTC Calculation: 408 R Axis:   60 Text Interpretation:  Sinus rhythm Short PR interval similar to prior 7/19 Confirmed by ,  ([id number]) on 01/01/2018 6:57:10 AM   Radiology Dg Chest 2 View  Result Date: 01/01/2018 CLINICAL DATA:  Acute chest pain and cough for 1 day. EXAM: CHEST - 2 VIEW COMPARISON:  08/11/2017 and prior chest radiographs FINDINGS: The cardiomediastinal silhouette is unremarkable. Mild hyperinflation again noted. There is no evidence of focal airspace disease, pulmonary edema, suspicious pulmonary nodule/mass, pleural effusion, or pneumothorax. No acute bony abnormalities are identified. IMPRESSION: Mild hyperinflation without evidence of acute cardiopulmonary disease. Electronically Signed   By: Jeffrey  Hu M.D.   On: 01/01/2018 07:25    Procedures Procedures (including critical care  time)  Medications Ordered in ED Medications  ipratropium-albuterol (DUONEB) 0.5-2.5 (3) MG/3ML nebulizer solution 3 mL (3 mLs Nebulization Given 01/01/18 0822)  azithromycin (ZITHROMAX) tablet 500 mg (500 mg Oral Given 01/01/18 0931)  predniSONE (DELTASONE) tablet 60 mg (60 mg Oral Given 01/01/18 0931)  albuterol (PROVENTIL HFA;VENTOLIN HFA) 108 (90 Base) MCG/ACT inhaler 2 puff (2 puffs Inhalation Given 01/01/18 0929)  ondansetron (ZOFRAN) injection 4 mg (4 mg Intravenous Given 01/01/18 1006)     Initial Impression / Assessment and Plan / ED Course  I have reviewed the triage vital signs and the nursing notes.  Pertinent labs & imaging results that were available during my care of the patient were reviewed by me and considered in my medical decision making (see chart for details).  Clinical Course as of Jan 01 1714  Sun Jan 01, 2018  0832 Patient had a DuoNeb with improvement in her symptoms.  Lab work with a normal Eriksen counts and normal chemistries with a baseline creatinine of 1.27 above her norm.  BMP normal.  Lactate normal.  Chest x-ray read as no infiltrate.   [MB]  1113 Patient ambulated in the department without any difficulty.  She feels much improved.  She has had MDI teaching.  Will discharge home on azithromycin prednisone and MDI and have her follow-up with her PCP.  Good return instructions given.   [MB]    Clinical Course User Index [MB] ,  C, MD     Final Clinical Impressions(s) / ED Diagnoses   Final diagnoses:  Acute bronchitis, unspecified organism    ED Discharge Orders         Ordered    azithromycin (ZITHROMAX) 250 MG tablet  Daily     01/01/18 1117    predniSONE (DELTASONE) 20 MG tablet  Daily     11 /24/19 1117           Hayden Rasmussen, MD 01/01/18 1715

## 2018-01-02 ENCOUNTER — Ambulatory Visit (HOSPITAL_COMMUNITY): Payer: Medicare Other | Admitting: Anesthesiology

## 2018-01-02 ENCOUNTER — Encounter (HOSPITAL_COMMUNITY): Payer: Self-pay | Admitting: Anesthesiology

## 2018-01-02 ENCOUNTER — Ambulatory Visit (HOSPITAL_COMMUNITY)
Admission: RE | Admit: 2018-01-02 | Discharge: 2018-01-02 | Disposition: A | Discharge status: Home / Self Care | Payer: Medicare Other | Source: Ambulatory Visit | Attending: Ophthalmology | Admitting: Ophthalmology

## 2018-01-02 ENCOUNTER — Encounter (HOSPITAL_COMMUNITY)
Admission: RE | Disposition: A | Discharge status: Home / Self Care | Payer: Self-pay | Source: Ambulatory Visit | Attending: Ophthalmology

## 2018-01-02 DIAGNOSIS — H2511 Age-related nuclear cataract, right eye: Secondary | ICD-10-CM | POA: Diagnosis not present

## 2018-01-02 DIAGNOSIS — H25811 Combined forms of age-related cataract, right eye: Secondary | ICD-10-CM | POA: Diagnosis not present

## 2018-01-02 DIAGNOSIS — Z888 Allergy status to other drugs, medicaments and biological substances status: Secondary | ICD-10-CM | POA: Diagnosis not present

## 2018-01-02 DIAGNOSIS — K219 Gastro-esophageal reflux disease without esophagitis: Secondary | ICD-10-CM | POA: Diagnosis not present

## 2018-01-02 DIAGNOSIS — I10 Essential (primary) hypertension: Secondary | ICD-10-CM | POA: Insufficient documentation

## 2018-01-02 DIAGNOSIS — F419 Anxiety disorder, unspecified: Secondary | ICD-10-CM | POA: Insufficient documentation

## 2018-01-02 DIAGNOSIS — D649 Anemia, unspecified: Secondary | ICD-10-CM | POA: Diagnosis not present

## 2018-01-02 DIAGNOSIS — Z87891 Personal history of nicotine dependence: Secondary | ICD-10-CM | POA: Insufficient documentation

## 2018-01-02 HISTORY — PX: CATARACT EXTRACTION W/PHACO: SHX586

## 2018-01-02 SURGERY — PHACOEMULSIFICATION, CATARACT, WITH IOL INSERTION
Anesthesia: Monitor Anesthesia Care | Site: Eye | Laterality: Right

## 2018-01-02 MED ORDER — NEOMYCIN-POLYMYXIN-DEXAMETH 3.5-10000-0.1 OP SUSP
OPHTHALMIC | Status: DC | PRN
  Administered 2018-01-02: 2 [drp] via OPHTHALMIC

## 2018-01-02 MED ORDER — PROVISC 10 MG/ML IO SOLN
INTRAOCULAR | Status: DC | PRN
  Administered 2018-01-02: 0.85 mL via INTRAOCULAR

## 2018-01-02 MED ORDER — MIDAZOLAM HCL 2 MG/2ML IJ SOLN
INTRAMUSCULAR | Status: AC
  Filled 2018-01-02: qty 2

## 2018-01-02 MED ORDER — CYCLOPENTOLATE-PHENYLEPHRINE 0.2-1 % OP SOLN
1.0000 [drp] | OPHTHALMIC | Status: AC
  Administered 2018-01-02 (×3): 1 [drp] via OPHTHALMIC

## 2018-01-02 MED ORDER — POVIDONE-IODINE 5 % OP SOLN
OPHTHALMIC | Status: DC | PRN
  Administered 2018-01-02: 1 via OPHTHALMIC

## 2018-01-02 MED ORDER — TETRACAINE HCL 0.5 % OP SOLN
1.0000 [drp] | OPHTHALMIC | Status: AC
  Administered 2018-01-02 (×3): 1 [drp] via OPHTHALMIC

## 2018-01-02 MED ORDER — EPINEPHRINE PF 1 MG/ML IJ SOLN
INTRAOCULAR | Status: DC | PRN
  Administered 2018-01-02: 500 mL

## 2018-01-02 MED ORDER — PHENYLEPHRINE HCL 2.5 % OP SOLN
1.0000 [drp] | OPHTHALMIC | Status: AC
  Administered 2018-01-02 (×3): 1 [drp] via OPHTHALMIC

## 2018-01-02 MED ORDER — LACTATED RINGERS IV SOLN
INTRAVENOUS | Status: DC
  Administered 2018-01-02: 1000 mL via INTRAVENOUS

## 2018-01-02 MED ORDER — BSS IO SOLN
INTRAOCULAR | Status: DC | PRN
  Administered 2018-01-02: 15 mL

## 2018-01-02 MED ORDER — LIDOCAINE HCL (PF) 1 % IJ SOLN
INTRAMUSCULAR | Status: DC | PRN
  Administered 2018-01-02: .5 mL

## 2018-01-02 MED ORDER — LIDOCAINE HCL 3.5 % OP GEL
1.0000 "application " | Freq: Once | OPHTHALMIC | Status: AC
  Administered 2018-01-02: 1 via OPHTHALMIC

## 2018-01-02 SURGICAL SUPPLY — 10 items
CLOTH BEACON ORANGE TIMEOUT ST (SAFETY) ×1 IMPLANT
EYE SHIELD UNIVERSAL CLEAR (GAUZE/BANDAGES/DRESSINGS) ×1 IMPLANT
GLOVE BIOGEL PI IND STRL 6.5 (GLOVE) IMPLANT
GLOVE BIOGEL PI INDICATOR 6.5 (GLOVE) ×1
LENS ALC ACRYL/TECN (Ophthalmic Related) ×1 IMPLANT
PAD ARMBOARD 7.5X6 YLW CONV (MISCELLANEOUS) ×1 IMPLANT
SYRINGE LUER LOK 1CC (MISCELLANEOUS) ×1 IMPLANT
TAPE SURG TRANSPORE 1 IN (GAUZE/BANDAGES/DRESSINGS) IMPLANT
TAPE SURGICAL TRANSPORE 1 IN (GAUZE/BANDAGES/DRESSINGS) ×1
WATER STERILE IRR 250ML POUR (IV SOLUTION) ×1 IMPLANT

## 2018-01-02 NOTE — Anesthesia Postprocedure Evaluation (Signed)
Anesthesia Post Note  Patient: Robin [name]  Procedure(s) Performed: CATARACT EXTRACTION PHACO AND INTRAOCULAR LENS PLACEMENT ([place]) (Right Eye)  Patient location during evaluation: PACU Anesthesia Type: MAC Level of consciousness: awake and awake and alert Pain management: pain level controlled Vital Signs Assessment: post-procedure vital signs reviewed and stable Respiratory status: spontaneous breathing Cardiovascular status: stable Postop Assessment: no headache Anesthetic complications: no     Last Vitals:  Vitals:   01/02/18 0856 01/02/18 1022  BP: 131/71 140/69  Pulse: 60 64  Resp: 18 16  Temp: 36.8 [name] 36.8 [name]  SpO2: 100% 95%    Last Pain:  Vitals:   01/02/18 1022  PainSc: 0-No pain                 Robin [name] [place]

## 2018-01-02 NOTE — Transfer of Care (Signed)
Immediate Anesthesia Transfer of Care Note  Patient: Robin [name]  Procedure(s) Performed: CATARACT EXTRACTION PHACO AND INTRAOCULAR LENS PLACEMENT ([facility]) (Right Eye)  Patient Location: PACU  Anesthesia Type:MAC  Level of Consciousness: awake, alert  and oriented  Airway & Oxygen Therapy: Patient Spontanous Breathing  Post-op Assessment: Report given to RN and Post -op Vital signs reviewed and stable  Post vital signs: Reviewed and stable  Last Vitals:  Vitals Value Taken Time  BP    Temp    Pulse    Resp    SpO2      Last Pain:  Vitals:   01/02/18 0856  PainSc: 0-No pain         Complications: No apparent anesthesia complications

## 2018-01-02 NOTE — Op Note (Signed)
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  Assistants: None  Anesthesia: Topical with MAC  Indications: Painless, progressive loss of vision with compromise of daily activities.  Surgery: Cataract Extraction with Intraocular lens Implant Right Eye  Discription: The patient had dilating drops and viscous lidocaine placed into the Right eye in the pre-op holding area. After transfer to the operating room, a time out was performed. The patient was then prepped and draped. Beginning with a 40m blade a paracentesis port was made at the surgeon's 2 o'clock position. The anterior chamber was then filled with 1% non-preserved lidocaine. This was followed by filling the anterior chamber with Provisc.  A 2.486mkeratome blade was used to make a clear corneal incision at the temporal limbus.  A bent cystatome needle was used to create a continuous tear capsulotomy. Hydrodissection was performed with balanced salt solution on a Fine canula. The lens nucleus was then removed using the phacoemulsification handpiece. Residual cortex was removed with the I&A handpiece. The anterior chamber and capsular bag were refilled with Provisc. A posterior chamber intraocular lens was placed into the capsular bag with it's injector. The implant was positioned with the Kuglan hook. The Provisc was then removed from the anterior chamber and capsular bag with the I&A handpiece. Stromal hydration of the main incision and paracentesis port was performed with BSS on a Fine canula. The wounds were tested for leak which was negative. The patient tolerated the procedure well. There were no operative complications. The patient was then transferred to the recovery room in stable condition.  Complications: None  Specimen: None  EBL: None  Prosthetic device: J[employer]J Technis, PCB00, power 23.0,  SN [id number]

## 2018-01-02 NOTE — H&P (Signed)
I have reviewed the H&P, the patient was re-examined, and I have identified no interval changes in medical condition and plan of care since the history and physical of record  

## 2018-01-02 NOTE — Anesthesia Preprocedure Evaluation (Signed)
Anesthesia Evaluation  Patient identified by MRN, date of birth, ID band Patient awake    Reviewed: Allergy & Precautions, H&P , NPO status , Patient's Chart, lab work & pertinent test results  Airway Mallampati: II  TM Distance: >3 FB Neck ROM: full    Dental no notable dental hx. (+) Upper Dentures, Lower Dentures   Pulmonary pneumonia, former smoker,    Pulmonary exam normal breath sounds clear to auscultation       Cardiovascular Exercise Tolerance: Good hypertension, On Medications negative cardio ROS   Rhythm:regular Rate:Normal     Neuro/Psych PSYCHIATRIC DISORDERS Anxiety negative neurological ROS     GI/Hepatic Neg liver ROS, GERD  Medicated,  Endo/Other  negative endocrine ROS  Renal/GU CRFRenal disease  negative genitourinary   Musculoskeletal   Abdominal   Peds  Hematology negative hematology ROS (+) anemia ,   Anesthesia Other Findings   Reproductive/Obstetrics negative OB ROS                             Anesthesia Physical  Anesthesia Plan  ASA: III  Anesthesia Plan: MAC   Post-op Pain Management:    Induction:   PONV Risk Score and Plan:   Airway Management Planned:   Additional Equipment:   Intra-op Plan:   Post-operative Plan:   Informed Consent: I have reviewed the patients History and Physical, chart, labs and discussed the procedure including the risks, benefits and alternatives for the proposed anesthesia with the patient or authorized representative who has indicated his/her understanding and acceptance.   Dental Advisory Given  Plan Discussed with: CRNA  Anesthesia Plan Comments:         Anesthesia Quick Evaluation

## 2018-01-02 NOTE — Discharge Instructions (Signed)

## 2018-01-03 ENCOUNTER — Encounter (HOSPITAL_COMMUNITY): Payer: Self-pay | Admitting: Ophthalmology

## 2018-01-03 NOTE — Addendum Note (Signed)
Addendum  created 01/03/18 0837 by Vista Deck, CRNA   Charge Capture section accepted

## 2018-01-06 LAB — CULTURE, BLOOD (ROUTINE X 2)
CULTURE: NO GROWTH
Culture: NO GROWTH
SPECIAL REQUESTS: ADEQUATE
Special Requests: ADEQUATE

## 2018-01-20 ENCOUNTER — Ambulatory Visit: Payer: Medicare Other | Admitting: Physician Assistant

## 2018-01-24 ENCOUNTER — Encounter: Payer: Self-pay | Admitting: Physician Assistant

## 2018-02-20 ENCOUNTER — Ambulatory Visit (INDEPENDENT_AMBULATORY_CARE_PROVIDER_SITE_OTHER): Payer: Medicare Other | Admitting: Physician Assistant

## 2018-02-20 ENCOUNTER — Ambulatory Visit (INDEPENDENT_AMBULATORY_CARE_PROVIDER_SITE_OTHER): Payer: Medicare Other

## 2018-02-20 ENCOUNTER — Encounter: Payer: Self-pay | Admitting: Physician Assistant

## 2018-02-20 VITALS — BP 106/67 | HR 83 | Temp 98.3°F | Ht 60.0 in | Wt 124.6 lb

## 2018-02-20 DIAGNOSIS — R634 Abnormal weight loss: Secondary | ICD-10-CM | POA: Diagnosis not present

## 2018-02-20 DIAGNOSIS — R05 Cough: Secondary | ICD-10-CM

## 2018-02-20 DIAGNOSIS — D508 Other iron deficiency anemias: Secondary | ICD-10-CM

## 2018-02-20 DIAGNOSIS — E785 Hyperlipidemia, unspecified: Secondary | ICD-10-CM | POA: Diagnosis not present

## 2018-02-20 DIAGNOSIS — Z1239 Encounter for other screening for malignant neoplasm of breast: Secondary | ICD-10-CM | POA: Diagnosis not present

## 2018-02-20 DIAGNOSIS — R059 Cough, unspecified: Secondary | ICD-10-CM

## 2018-02-20 DIAGNOSIS — I10 Essential (primary) hypertension: Secondary | ICD-10-CM

## 2018-02-20 MED ORDER — ALBUTEROL SULFATE HFA 108 (90 BASE) MCG/ACT IN AERS: 2.0000 | INHALATION_SPRAY | Freq: Four times a day (QID) | RESPIRATORY_TRACT | 0 refills | Status: DC | PRN

## 2018-02-20 MED ORDER — CEFDINIR 300 MG PO CAPS: 300.0000 mg | ORAL_CAPSULE | Freq: Two times a day (BID) | ORAL | 0 refills | Status: DC

## 2018-02-20 MED ORDER — PREDNISONE 10 MG (48) PO TBPK: ORAL_TABLET | ORAL | 0 refills | Status: DC

## 2018-02-21 LAB — CMP14+EGFR
A/G RATIO: 2 (ref 1.2–2.2)
ALT: 10 IU/L (ref 0–32)
AST: 16 IU/L (ref 0–40)
Albumin: 4.1 g/dL (ref 3.5–4.8)
Alkaline Phosphatase: 119 IU/L — ABNORMAL HIGH (ref 39–117)
BUN/Creatinine Ratio: 13 (ref 12–28)
BUN: 35 mg/dL — ABNORMAL HIGH (ref 8–27)
Bilirubin Total: 0.4 mg/dL (ref 0.0–1.2)
CALCIUM: 9.1 mg/dL (ref 8.7–10.3)
CO2: 21 mmol/L (ref 20–29)
Chloride: 102 mmol/L (ref 96–106)
Creatinine, Ser: 2.66 mg/dL — ABNORMAL HIGH (ref 0.57–1.00)
GFR calc Af Amer: 19 mL/min/{1.73_m2} — ABNORMAL LOW (ref >59)
GFR calc non Af Amer: 17 mL/min/{1.73_m2} — ABNORMAL LOW (ref >59)
Globulin, Total: 2.1 g/dL (ref 1.5–4.5)
Glucose: 104 mg/dL — ABNORMAL HIGH (ref 65–99)
Potassium: 3.6 mmol/L (ref 3.5–5.2)
Sodium: 143 mmol/L (ref 134–144)
Total Protein: 6.2 g/dL (ref 6.0–8.5)

## 2018-02-21 LAB — CBC WITH DIFFERENTIAL/PLATELET
Basophils Absolute: 0.1 10*3/uL (ref 0.0–0.2)
Basos: 1 %
EOS (ABSOLUTE): 0.1 10*3/uL (ref 0.0–0.4)
Eos: 2 %
Hematocrit: 33.4 % — ABNORMAL LOW (ref 34.0–46.6)
Hemoglobin: 10.9 g/dL — ABNORMAL LOW (ref 11.1–15.9)
Immature Grans (Abs): 0 10*3/uL (ref 0.0–0.1)
Immature Granulocytes: 0 %
Lymphocytes Absolute: 1.6 10*3/uL (ref 0.7–3.1)
Lymphs: 22 %
MCH: 27.7 pg (ref 26.6–33.0)
MCHC: 32.6 g/dL (ref 31.5–35.7)
MCV: 85 fL (ref 79–97)
Monocytes Absolute: 0.5 10*3/uL (ref 0.1–0.9)
Monocytes: 7 %
NEUTROS ABS: 5 10*3/uL (ref 1.4–7.0)
Neutrophils: 68 %
Platelets: 330 10*3/uL (ref 150–450)
RBC: 3.94 x10E6/uL (ref 3.77–5.28)
RDW: 14.5 % (ref 11.7–15.4)
WBC: 7.3 10*3/uL (ref 3.4–10.8)

## 2018-02-21 LAB — LIPID PANEL
CHOL/HDL RATIO: 2.4 ratio (ref 0.0–4.4)
Cholesterol, Total: 185 mg/dL (ref 100–199)
HDL: 77 mg/dL (ref >39)
LDL Calculated: 85 mg/dL (ref 0–99)
Triglycerides: 115 mg/dL (ref 0–149)
VLDL Cholesterol Cal: 23 mg/dL (ref 5–40)

## 2018-02-21 LAB — TSH: TSH: 1.06 u[IU]/mL (ref 0.450–4.500)

## 2018-02-22 ENCOUNTER — Other Ambulatory Visit: Payer: Self-pay

## 2018-02-22 ENCOUNTER — Emergency Department (HOSPITAL_COMMUNITY): Payer: No Typology Code available for payment source

## 2018-02-22 ENCOUNTER — Emergency Department (HOSPITAL_COMMUNITY)
Admission: EM | Admit: 2018-02-22 | Discharge: 2018-02-22 | Disposition: A | Discharge status: Home / Self Care | Payer: No Typology Code available for payment source | Attending: Emergency Medicine | Admitting: Emergency Medicine

## 2018-02-22 ENCOUNTER — Encounter (HOSPITAL_COMMUNITY): Payer: Self-pay | Admitting: Emergency Medicine

## 2018-02-22 DIAGNOSIS — Y92410 Unspecified street and highway as the place of occurrence of the external cause: Secondary | ICD-10-CM | POA: Insufficient documentation

## 2018-02-22 DIAGNOSIS — S0990XA Unspecified injury of head, initial encounter: Secondary | ICD-10-CM | POA: Insufficient documentation

## 2018-02-22 DIAGNOSIS — Y93I9 Activity, other involving external motion: Secondary | ICD-10-CM | POA: Insufficient documentation

## 2018-02-22 DIAGNOSIS — Y998 Other external cause status: Secondary | ICD-10-CM | POA: Insufficient documentation

## 2018-02-22 DIAGNOSIS — R51 Headache: Secondary | ICD-10-CM | POA: Diagnosis not present

## 2018-02-22 DIAGNOSIS — S199XXA Unspecified injury of neck, initial encounter: Secondary | ICD-10-CM | POA: Insufficient documentation

## 2018-02-22 DIAGNOSIS — I10 Essential (primary) hypertension: Secondary | ICD-10-CM | POA: Insufficient documentation

## 2018-02-22 DIAGNOSIS — Z79899 Other long term (current) drug therapy: Secondary | ICD-10-CM | POA: Insufficient documentation

## 2018-02-22 DIAGNOSIS — Z87891 Personal history of nicotine dependence: Secondary | ICD-10-CM | POA: Diagnosis not present

## 2018-02-22 DIAGNOSIS — M542 Cervicalgia: Secondary | ICD-10-CM | POA: Diagnosis not present

## 2018-02-22 NOTE — Discharge Instructions (Addendum)
X-rays were normal.  You will be sore for several days.  Tylenol or ibuprofen for pain.  Consider cold pack in the beginning, then followed by heating pad.

## 2018-02-22 NOTE — Progress Notes (Signed)
Pt's necklace taken off and placed in plastic bag during CT exam and put in pt's hand.  Pt transported back to ED with necklace in hand.

## 2018-02-22 NOTE — ED Notes (Signed)
Patient transported to CT 
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
## 2018-02-23 NOTE — Progress Notes (Signed)
BP 106/67   Pulse 83   Temp 98.3 F (36.8 C) (Oral)   Ht 5' (1.524 m)   Wt 124 lb 9.6 oz (56.5 kg)   BMI 24.33 kg/m    Subjective:    Patient ID: Robin [name], female    DOB: 01/03/1941, 78 y.o.   MRN: [id number]  HPI: Eh Sauseda is a 78 y.o. female presenting on 02/20/2018 for Cough; Nasal Congestion; Hyperlipidemia; and Hypertension  This patient comes in for periodic recheck on medications and conditions including That is hyperlipidemia, hypertension, gout and patient seen anemia.  She reports overall that she is doing well.  She also does need her mammogram performed and will get this ordered..   All medications are reviewed today. There are no reports of any problems with the medications. All of the medical conditions are reviewed and updated.  Lab work is reviewed and will be ordered as medically necessary. There are no new problems reported with today's visit.   Past Medical History:  Diagnosis Date  . Arthritis   . Hypercholesteremia   . Hypertension   . Pneumonia   . Reflux    Relevant past medical, surgical, family and social history reviewed and updated as indicated. Interim medical history since our last visit reviewed. Allergies and medications reviewed and updated. DATA REVIEWED: CHART IN EPIC  Family History reviewed for pertinent findings.  Review of Systems  Constitutional: Negative.  Negative for activity change, fatigue and fever.  HENT: Negative.   Eyes: Negative.   Respiratory: Negative.  Negative for cough.   Cardiovascular: Negative.  Negative for chest pain.  Gastrointestinal: Negative.  Negative for abdominal pain.  Endocrine: Negative.   Genitourinary: Negative.  Negative for dysuria.  Musculoskeletal: Negative.   Skin: Negative.   Neurological: Negative.     Allergies as of 02/20/2018      Reactions   Dilaudid [hydromorphone Hcl] Nausea And Vomiting   Lisinopril Cough   Valium [diazepam] Other (See Comments)   Family  states patient has adverse reaction and displays confusion.       Medication List       Accurate as of February 20, 2018 11:59 PM. Always use your most recent med list.        albuterol 108 (90 Base) MCG/ACT inhaler Commonly known as:  PROVENTIL HFA;VENTOLIN HFA Inhale 2 puffs into the lungs every 6 (six) hours as needed for wheezing or shortness of breath.   ALPRAZolam 0.5 MG tablet Commonly known as:  XANAX Take 0.5 tablets (0.25 mg total) by mouth at bedtime as needed for anxiety.   aspirin 81 MG tablet Take 81 mg by mouth daily.   atorvastatin 40 MG tablet Commonly known as:  LIPITOR Take 1 tablet (40 mg total) by mouth daily.   BESIVANCE 0.6 % Susp Generic drug:  Besifloxacin HCl Place 1 drop into both eyes 3 (three) times daily.   cefdinir 300 MG capsule Commonly known as:  OMNICEF Take 1 capsule (300 mg total) by mouth 2 (two) times daily. 1 po BID   cetirizine 10 MG tablet Commonly known as:  ZYRTEC Take 1 tablet (10 mg total) by mouth daily as needed for allergies.   cholecalciferol 1000 units tablet Commonly known as:  VITAMIN D Take 1,000 Units by mouth daily.   esomeprazole 40 MG capsule Commonly known as:  NEXIUM Take 1 capsule (40 mg total) by mouth daily.   hydrochlorothiazide 25 MG tablet Commonly known as:  HYDRODIURIL Take 1 tablet (  25 mg total) by mouth daily.   predniSONE 10 MG (48) Tbpk tablet Commonly known as:  STERAPRED UNI-PAK 48 TAB Take as directed for 12 days          Objective:    BP 106/67   Pulse 83   Temp 98.3 F (36.8 C) (Oral)   Ht 5' (1.524 m)   Wt 124 lb 9.6 oz (56.5 kg)   BMI 24.33 kg/m   Allergies  Allergen Reactions  . Dilaudid [Hydromorphone Hcl] Nausea And Vomiting  . Lisinopril Cough  . Valium [Diazepam] Other (See Comments)    Family states patient has adverse reaction and displays confusion.     Wt Readings from Last 3 Encounters:  02/20/18 124 lb 9.6 oz (56.5 kg)  01/01/18 133 lb 9.6 oz (60.6 kg)    12/20/17 133 lb 9.6 oz (60.6 kg)    Physical Exam Constitutional:      Appearance: She is well-developed.  HENT:     Head: Normocephalic and atraumatic.  Eyes:     Conjunctiva/sclera: Conjunctivae normal.     Pupils: Pupils are equal, round, and reactive to light.  Cardiovascular:     Rate and Rhythm: Normal rate and regular rhythm.     Heart sounds: Normal heart sounds.  Pulmonary:     Effort: Pulmonary effort is normal.     Breath sounds: Normal breath sounds.  Abdominal:     General: Bowel sounds are normal.     Palpations: Abdomen is soft.  Skin:    General: Skin is warm and dry.     Findings: No rash.  Neurological:     Mental Status: She is alert and oriented to person, place, and time.     Deep Tendon Reflexes: Reflexes are normal and symmetric.  Psychiatric:        Behavior: Behavior normal.        Thought Content: Thought content normal.        Judgment: Judgment normal.     Results for orders placed or performed in visit on 02/20/18  CBC with Differential/Platelet  Result Value Ref Range   WBC 7.3 3.4 - 10.8 x10E3/uL   RBC 3.94 3.77 - 5.28 x10E6/uL   Hemoglobin 10.9 (L) 11.1 - 15.9 g/dL   Hematocrit 33.4 (L) 34.0 - 46.6 %   MCV 85 79 - 97 fL   MCH 27.7 26.6 - 33.0 pg   MCHC 32.6 31.5 - 35.7 g/dL   RDW 14.5 11.7 - 15.4 %   Platelets 330 150 - 450 x10E3/uL   Neutrophils 68 Not Estab. %   Lymphs 22 Not Estab. %   Monocytes 7 Not Estab. %   Eos 2 Not Estab. %   Basos 1 Not Estab. %   Neutrophils Absolute 5.0 1.4 - 7.0 x10E3/uL   Lymphocytes Absolute 1.6 0.7 - 3.1 x10E3/uL   Monocytes Absolute 0.5 0.1 - 0.9 x10E3/uL   EOS (ABSOLUTE) 0.1 0.0 - 0.4 x10E3/uL   Basophils Absolute 0.1 0.0 - 0.2 x10E3/uL   Immature Granulocytes 0 Not Estab. %   Immature Grans (Abs) 0.0 0.0 - 0.1 x10E3/uL  CMP14+EGFR  Result Value Ref Range   Glucose 104 (H) 65 - 99 mg/dL   BUN 35 (H) 8 - 27 mg/dL   Creatinine, Ser 2.66 (H) 0.57 - 1.00 mg/dL   GFR calc non Af Amer 17 (L)  >59 mL/min/1.73   GFR calc Af Amer 19 (L) >59 mL/min/1.73   BUN/Creatinine Ratio 13 12 - 28  Sodium 143 134 - 144 mmol/L   Potassium 3.6 3.5 - 5.2 mmol/L   Chloride 102 96 - 106 mmol/L   CO2 21 20 - 29 mmol/L   Calcium 9.1 8.7 - 10.3 mg/dL   Total Protein 6.2 6.0 - 8.5 g/dL   Albumin 4.1 3.5 - 4.8 g/dL   Globulin, Total 2.1 1.5 - 4.5 g/dL   Albumin/Globulin Ratio 2.0 1.2 - 2.2   Bilirubin Total 0.4 0.0 - 1.2 mg/dL   Alkaline Phosphatase 119 (H) 39 - 117 IU/L   AST 16 0 - 40 IU/L   ALT 10 0 - 32 IU/L  Lipid panel  Result Value Ref Range   Cholesterol, Total 185 100 - 199 mg/dL   Triglycerides 115 0 - 149 mg/dL   HDL 77 >39 mg/dL   VLDL Cholesterol Cal 23 5 - 40 mg/dL   LDL Calculated 85 0 - 99 mg/dL   Chol/HDL Ratio 2.4 0.0 - 4.4 ratio  TSH  Result Value Ref Range   TSH 1.060 0.450 - 4.500 uIU/mL      Assessment & Plan:   1. Cough - DG Chest 2 View; Future  2. Screening breast examination - MM Digital Screening; Future  3. Breast cancer screening  4. Weight loss  - CBC with Differential/Platelet - CMP14+EGFR - Lipid panel - TSH - MM Digital Screening; Future  5. Hyperlipidemia, unspecified hyperlipidemia type - CBC with Differential/Platelet - CMP14+EGFR - Lipid panel - TSH  6. Essential hypertension - CBC with Differential/Platelet - CMP14+EGFR - Lipid panel - TSH  7. Other iron deficiency anemia - CBC with Differential/Platelet - TSH   Continue all other maintenance medications as listed above.  Follow up plan: No follow-ups on file.  Educational handout given for [place] PA-C Sharon [address]  [place], [place] [address] [phone]   02/23/2018, 9:57 PM
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
## 2018-02-24 ENCOUNTER — Other Ambulatory Visit: Payer: Self-pay | Admitting: *Deleted

## 2018-02-24 DIAGNOSIS — R7989 Other specified abnormal findings of blood chemistry: Secondary | ICD-10-CM

## 2018-02-27 ENCOUNTER — Ambulatory Visit (INDEPENDENT_AMBULATORY_CARE_PROVIDER_SITE_OTHER): Payer: Medicare Other | Admitting: Physician Assistant

## 2018-02-27 ENCOUNTER — Encounter: Payer: Self-pay | Admitting: Physician Assistant

## 2018-02-27 ENCOUNTER — Ambulatory Visit (INDEPENDENT_AMBULATORY_CARE_PROVIDER_SITE_OTHER): Payer: Medicare Other

## 2018-02-27 DIAGNOSIS — R0789 Other chest pain: Secondary | ICD-10-CM

## 2018-02-27 DIAGNOSIS — G44321 Chronic post-traumatic headache, intractable: Secondary | ICD-10-CM | POA: Diagnosis not present

## 2018-02-27 DIAGNOSIS — S20212D Contusion of left front wall of thorax, subsequent encounter: Secondary | ICD-10-CM

## 2018-02-27 DIAGNOSIS — M542 Cervicalgia: Secondary | ICD-10-CM | POA: Diagnosis not present

## 2018-02-27 DIAGNOSIS — S299XXA Unspecified injury of thorax, initial encounter: Secondary | ICD-10-CM | POA: Diagnosis not present

## 2018-02-27 MED ORDER — HYDROCODONE-ACETAMINOPHEN 5-325 MG PO TABS: 1.0000 | ORAL_TABLET | Freq: Four times a day (QID) | ORAL | 0 refills | Status: DC | PRN
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
## 2018-03-08 ENCOUNTER — Ambulatory Visit: Payer: Medicare Other | Admitting: Physician Assistant

## 2018-03-21 ENCOUNTER — Encounter: Payer: Self-pay | Admitting: Physician Assistant

## 2018-03-21 ENCOUNTER — Ambulatory Visit (INDEPENDENT_AMBULATORY_CARE_PROVIDER_SITE_OTHER): Payer: Medicare Other | Admitting: Physician Assistant

## 2018-03-21 VITALS — BP 111/70 | HR 96 | Temp 98.8°F | Ht 60.0 in | Wt 121.6 lb

## 2018-03-21 DIAGNOSIS — R0789 Other chest pain: Secondary | ICD-10-CM | POA: Diagnosis not present

## 2018-03-21 DIAGNOSIS — G44321 Chronic post-traumatic headache, intractable: Secondary | ICD-10-CM | POA: Diagnosis not present

## 2018-03-21 DIAGNOSIS — S20212D Contusion of left front wall of thorax, subsequent encounter: Secondary | ICD-10-CM | POA: Diagnosis not present

## 2018-03-21 DIAGNOSIS — F0781 Postconcussional syndrome: Secondary | ICD-10-CM | POA: Insufficient documentation

## 2018-03-21 DIAGNOSIS — M542 Cervicalgia: Secondary | ICD-10-CM

## 2018-03-21 MED ORDER — HYDROCODONE-ACETAMINOPHEN 10-325 MG PO TABS: 1.0000 | ORAL_TABLET | Freq: Three times a day (TID) | ORAL | 0 refills | Status: DC | PRN

## 2018-03-21 NOTE — Patient Instructions (Signed)
Concussion, Adult A concussion is a brain injury from a direct hit (blow) to the head or body. This injury causes the brain to shake quickly back and forth inside the skull. It is caused by:  A hit to the head.  A quick and sudden movement (jolt) of the head or neck. How fast you will get better from a concussion depends on many things. Recovery can take time. It is important to wait to return to activity until a doctor says it is safe and your symptoms are all gone. Follow these instructions at home: Activity  Limit activities that need a lot of thought or concentration. You may need to talk with your work Freight forwarder or teachers about this. Limit activities such as: ? Homework or work for your job. ? Watching TV. ? Computer work. ? Playing memory games and puzzles.  Rest. Rest helps the brain to heal. Make sure you: ? Get plenty of sleep at night. Do not stay up late. ? Rest during the day. Take naps or rest breaks when you feel tired.  Do not do activities that could cause a second concussion, such as riding a bike or playing sports. It can be dangerous if you get another concussion before the first one has healed.  Ask your doctor when you can return to your normal activities, like driving, riding a bike, or using machinery. Your ability to react may be slower. Do not do these activities if you are dizzy. Your doctor will likely give you a plan for slowly going back to activities. General instructions  Take over-the-counter and prescription medicines only as told by your doctor.  Do not drink alcohol until your doctor says you can.  Watch your symptoms and tell other people to do the same. Other problems (complications) can happen after a concussion. Older adults with a brain injury may have a higher risk of serious problems, such as a blood clot in the brain.  Tell your work Freight forwarder, teachers, Government social research officer, school counselor, coach, or Product/process development scientist about your injury and symptoms.  Tell them about what you can or cannot do. They should watch you for: ? More problems with attention or concentration. ? More trouble remembering or learning new information. ? More time needed to do tasks or assignments. ? Being more annoyed (irritable) or having a harder time dealing with stress. ? Any other symptoms that get worse.  Keep all follow-up visits as told by your doctor. This is important. Prevention  It is very important that you donot get another brain injury, especially before you have healed. In rare cases, another injury can cause permanent brain damage, brain swelling, or death. You have the most risk if you get another head injury in the first 7-10 days after you were hurt before. To avoid injuries: ? Avoid activities that could make you get a second concussion, like contact sports. ? When you have returned to sports or activities:  Avoid plays or moves that can cause you to crash into another person. This is how most concussions happen.  Follow the rules and be respectful of other players. ? Get regular exercise that includes strength and balance training. ? Wear a helmet when you do activities like:  Biking.  Skiing.  Skateboarding.  Skating. ? Helmets can help protect you from serious skull and brain injuries, but they do not protect your from a concussion. Even when wearing a helmet, you should avoid being hit in the head. Contact a doctor if:  Your symptoms get worse or they do not get better.  You have new symptoms.  You have another injury. Get help right away if:  You have bad headaches or your headaches get worse.  You have weakness in any part of your body.  You are confused.  Your coordination gets worse.  You keep throwing up (vomiting).  You feel more sleepy than normal.  You twitch or shake violently (convulse) or have a seizure.  Your speech is not clear (is slurred).  You have strange behavior changes.  You have changes in  how you see (vision).  You pass out (lose consciousness). Summary  A concussion is a brain injury from a direct hit (blow) to the head or body.  This condition is treated with rest and careful watching of symptoms.  If you keep having symptoms, call your doctor. This information is not intended to replace advice given to you by your health care provider. Make sure you discuss any questions you have with your health care provider. Document Released: 01/13/2009 Document Revised: 03/08/2017 Document Reviewed: 03/08/2017 Elsevier Interactive Patient Education  2019 [employer].
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
## 2018-04-06 ENCOUNTER — Encounter: Payer: Self-pay | Admitting: Nurse Practitioner

## 2018-04-06 ENCOUNTER — Ambulatory Visit (INDEPENDENT_AMBULATORY_CARE_PROVIDER_SITE_OTHER): Payer: Medicare Other | Admitting: Nurse Practitioner

## 2018-04-06 VITALS — BP 111/67 | HR 70 | Temp 97.3°F | Ht 60.0 in | Wt 119.0 lb

## 2018-04-06 DIAGNOSIS — N3000 Acute cystitis without hematuria: Secondary | ICD-10-CM | POA: Diagnosis not present

## 2018-04-06 DIAGNOSIS — R39198 Other difficulties with micturition: Secondary | ICD-10-CM | POA: Diagnosis not present

## 2018-04-06 LAB — URINALYSIS, COMPLETE
Bilirubin, UA: NEGATIVE
GLUCOSE, UA: NEGATIVE
Ketones, UA: NEGATIVE
Nitrite, UA: NEGATIVE
RBC, UA: NEGATIVE
Specific Gravity, UA: 1.025 (ref 1.005–1.030)
Urobilinogen, Ur: 1 mg/dL (ref 0.2–1.0)
pH, UA: 5.5 (ref 5.0–7.5)

## 2018-04-06 LAB — MICROSCOPIC EXAMINATION
Epithelial Cells (non renal): >10 /hpf — AB (ref 0–10)
RBC MICROSCOPIC, UA: NONE SEEN /HPF (ref 0–2)

## 2018-04-06 MED ORDER — CIPROFLOXACIN HCL 500 MG PO TABS: 500.0000 mg | ORAL_TABLET | Freq: Two times a day (BID) | ORAL | 0 refills | Status: DC

## 2018-04-06 NOTE — Progress Notes (Signed)
   Subjective:    Patient ID: Robin [name], female    DOB: Jul 19, 1940, 78 y.o.   MRN: [id number]   Chief Complaint: Back Pain and Urinary Retention   HPI Patient comes in today c/o urinary retention an dlow back pain . started this past Monday. She feels like she needs to pee but wont come out.   Review of Systems  Constitutional: Negative for chills and fever.  Genitourinary: Positive for urgency. Negative for dysuria.  Musculoskeletal: Positive for back pain.  Neurological: Negative.   Psychiatric/Behavioral: Negative.   All other systems reviewed and are negative.      Objective:   Physical Exam Vitals signs and nursing note reviewed.  Constitutional:      Appearance: She is normal weight.  Cardiovascular:     Rate and Rhythm: Normal rate and regular rhythm.     Pulses: Normal pulses.     Heart sounds: Normal heart sounds.  Pulmonary:     Effort: Pulmonary effort is normal.     Breath sounds: Normal breath sounds.  Abdominal:     General: Abdomen is flat. Bowel sounds are normal.     Palpations: Abdomen is soft.  Skin:    General: Skin is warm and dry.  Neurological:     General: No focal deficit present.     Mental Status: She is alert and oriented to person, place, and time.  Psychiatric:        Mood and Affect: Mood normal.        Behavior: Behavior normal.    BP 111/67   Pulse 70   Temp (!) 97.3 F (36.3 C) (Oral)   Ht 5' (1.524 m)   Wt 119 lb (54 kg)   BMI 23.24 kg/m         Assessment & Plan:  Robin [name] in today with chief complaint of Back Pain and Urinary Retention   1. Difficulty in urination  - Urinalysis, Complete  2. Acute cystitis without hematuria Force fluids RTO if no improvement - ciprofloxacin (CIPRO) 500 MG tablet; Take 1 tablet (500 mg total) by mouth 2 (two) times daily.  Dispense: 14 tablet; Refill: 0 - Urine Culture  Mary-Margaret Hassell Done, FNP

## 2018-04-06 NOTE — Patient Instructions (Signed)
Acute Urinary Retention, Female    Acute urinary retention means that you cannot pee (urinate) at all, or that you pee too little and your bladder is not emptied completely. If it is not treated, it can lead to kidney damage or other serious problems.  Follow these instructions at home:   Take over-the-counter and prescription medicines only as told by your doctor. Ask your doctor what medicines you should stay away from. Do not take any medicine unless your doctor says it is okay to do so.   If you were sent home with a tube that drains pee from the bladder (catheter), take care of it as told by your doctor.   Drink enough fluid to keep your pee clear or pale yellow.   If you were given an antibiotic, take it as told by your doctor. Do not stop taking the antibiotic even if you start to feel better.   Do not use any products that contain nicotine or tobacco, such as cigarettes and e-cigarettes. If you need help quitting, ask your doctor.   Watch for changes in your symptoms. Tell your doctor about them.   If told, keep track of any changes in your blood pressure at home. Tell your doctor about them.   Keep all follow-up visits as told by your doctor. This is important.  Contact a doctor if:   You have spasms or you leak pee when you have spasms.  Get help right away if:   You have chills or a fever.   You have blood in your pee.   You have a tube that drains the bladder and:  ? The tube stops draining pee.  ? The tube falls out.  Summary   Acute urinary retention means that you cannot pee at all, or that you pee too little and your bladder is not emptied completely. If it is not treated, it can result in kidney damage or other serious problems.   If you were sent home with a tube that drains pee from the bladder, take care of it as told by your doctor.   Pay attention to any changes in your symptoms. Tell your doctor about them.  This information is not intended to replace advice given to you by your  health care provider. Make sure you discuss any questions you have with your health care provider.  Document Released: 07/14/2007 Document Revised: 02/27/2016 Document Reviewed: 02/27/2016  Elsevier Interactive Patient Education  2019 [employer]

## 2018-04-07 LAB — URINE CULTURE

## 2018-04-21 ENCOUNTER — Ambulatory Visit (INDEPENDENT_AMBULATORY_CARE_PROVIDER_SITE_OTHER): Payer: Medicare Other | Admitting: Physician Assistant

## 2018-04-21 ENCOUNTER — Encounter: Payer: Self-pay | Admitting: Physician Assistant

## 2018-04-21 ENCOUNTER — Other Ambulatory Visit: Payer: Self-pay

## 2018-04-21 VITALS — BP 99/62 | HR 89 | Temp 98.2°F | Ht 60.0 in | Wt 122.6 lb

## 2018-04-21 DIAGNOSIS — R29898 Other symptoms and signs involving the musculoskeletal system: Secondary | ICD-10-CM | POA: Diagnosis not present

## 2018-04-21 DIAGNOSIS — R300 Dysuria: Secondary | ICD-10-CM | POA: Diagnosis not present

## 2018-04-21 DIAGNOSIS — M25551 Pain in right hip: Secondary | ICD-10-CM

## 2018-04-21 DIAGNOSIS — N3000 Acute cystitis without hematuria: Secondary | ICD-10-CM

## 2018-04-21 LAB — URINALYSIS, COMPLETE
Bilirubin, UA: NEGATIVE
Glucose, UA: NEGATIVE
Ketones, UA: NEGATIVE
Nitrite, UA: NEGATIVE
RBC, UA: NEGATIVE
Specific Gravity, UA: 1.025 (ref 1.005–1.030)
UUROB: 0.2 mg/dL (ref 0.2–1.0)
pH, UA: 5 (ref 5.0–7.5)

## 2018-04-21 LAB — MICROSCOPIC EXAMINATION: Epithelial Cells (non renal): >10 /hpf — AB (ref 0–10)

## 2018-04-21 MED ORDER — SULFAMETHOXAZOLE-TRIMETHOPRIM 800-160 MG PO TABS: 1.0000 | ORAL_TABLET | Freq: Two times a day (BID) | ORAL | 0 refills | Status: DC

## 2018-04-22 LAB — URINE CULTURE

## 2018-04-24 NOTE — Progress Notes (Signed)
BP 99/62   Pulse 89   Temp 98.2 F (36.8 C) (Oral)   Ht 5' (1.524 m)   Wt 122 lb 9.6 oz (55.6 kg)   BMI 23.94 kg/m    Subjective:    Patient ID: Robin [name], female    DOB: 02-16-1940, 78 y.o.   MRN: [id number]  HPI: Robin [name] is a 78 y.o. female presenting on 04/21/2018 for Concussion (1 month follow up ) and Urinary Tract Infection  That overall she is doing better with a concussion.  She states she is thinking better and moving around better she does use her walker when she has to go for long distance.  She is not having any significant problems related to her concussion.  This patient has had several days of dysuria, frequency and nocturia. There is also pain over the bladder in the suprapubic region, no back pain. Denies leakage or hematuria.  Denies fever or chills. No pain in flank area.   Past Medical History:  Diagnosis Date  . Arthritis   . Hypercholesteremia   . Hypertension   . Pneumonia   . Reflux    Relevant past medical, surgical, family and social history reviewed and updated as indicated. Interim medical history since our last visit reviewed. Allergies and medications reviewed and updated. DATA REVIEWED: CHART IN EPIC  Family History reviewed for pertinent findings.  Review of Systems  Constitutional: Negative.   HENT: Negative.   Eyes: Negative.   Respiratory: Negative.   Gastrointestinal: Negative.   Genitourinary: Positive for difficulty urinating, dysuria and urgency. Negative for flank pain.  Musculoskeletal: Positive for arthralgias, back pain and gait problem.    Allergies as of 04/21/2018      Reactions   Dilaudid [hydromorphone Hcl] Nausea And Vomiting   Lisinopril Cough   Valium [diazepam] Other (See Comments)   Family states patient has adverse reaction and displays confusion.       Medication List       Accurate as of April 21, 2018 11:59 PM. Always use your most recent med list.        albuterol 108 (90 Base)  MCG/ACT inhaler Commonly known as:  PROVENTIL HFA;VENTOLIN HFA Inhale 2 puffs into the lungs every 6 (six) hours as needed for wheezing or shortness of breath.   ALPRAZolam 0.5 MG tablet Commonly known as:  XANAX Take 0.5 tablets (0.25 mg total) by mouth at bedtime as needed for anxiety.   aspirin 81 MG tablet Take 81 mg by mouth daily.   atorvastatin 40 MG tablet Commonly known as:  LIPITOR Take 1 tablet (40 mg total) by mouth daily.   cetirizine 10 MG tablet Commonly known as:  ZYRTEC Take 1 tablet (10 mg total) by mouth daily as needed for allergies.   cholecalciferol 1000 units tablet Commonly known as:  VITAMIN D Take 1,000 Units by mouth daily.   esomeprazole 40 MG capsule Commonly known as:  NEXIUM Take 1 capsule (40 mg total) by mouth daily.   hydrochlorothiazide 25 MG tablet Commonly known as:  HYDRODIURIL Take 1 tablet (25 mg total) by mouth daily.   HYDROcodone-acetaminophen 10-325 MG tablet Commonly known as:  NORCO Take 1 tablet by mouth every 8 (eight) hours as needed.   sulfamethoxazole-trimethoprim 800-160 MG tablet Commonly known as:  Bactrim DS Take 1 tablet by mouth 2 (two) times daily.          Objective:    BP 99/62   Pulse 89   Temp  98.2 F (36.8 C) (Oral)   Ht 5' (1.524 m)   Wt 122 lb 9.6 oz (55.6 kg)   BMI 23.94 kg/m   Allergies  Allergen Reactions  . Dilaudid [Hydromorphone Hcl] Nausea And Vomiting  . Lisinopril Cough  . Valium [Diazepam] Other (See Comments)    Family states patient has adverse reaction and displays confusion.     Wt Readings from Last 3 Encounters:  04/21/18 122 lb 9.6 oz (55.6 kg)  04/06/18 119 lb (54 kg)  03/21/18 121 lb 9.6 oz (55.2 kg)    Physical Exam Constitutional:      Appearance: She is well-developed.  HENT:     Head: Normocephalic and atraumatic.  Eyes:     Conjunctiva/sclera: Conjunctivae normal.     Pupils: Pupils are equal, round, and reactive to light.  Cardiovascular:     Rate and  Rhythm: Normal rate and regular rhythm.     Heart sounds: Normal heart sounds.  Pulmonary:     Effort: Pulmonary effort is normal.     Breath sounds: Normal breath sounds.  Abdominal:     General: Bowel sounds are normal. There is no distension.     Palpations: Abdomen is soft. There is no mass.     Tenderness: There is abdominal tenderness in the suprapubic area. There is no guarding or rebound.  Skin:    General: Skin is warm and dry.     Findings: No rash.  Neurological:     Mental Status: She is alert and oriented to person, place, and time.     Deep Tendon Reflexes: Reflexes are normal and symmetric.  Psychiatric:        Behavior: Behavior normal.        Thought Content: Thought content normal.        Judgment: Judgment normal.     Results for orders placed or performed in visit on 04/21/18  Urine Culture  Result Value Ref Range   Urine Culture, Routine Final report    Organism ID, Bacteria Comment   Microscopic Examination  Result Value Ref Range   WBC, UA 6-10 (A) 0 - 5 /hpf   RBC, UA 0-2 0 - 2 /hpf   Epithelial Cells (non renal) >10 (A) 0 - 10 /hpf   Renal Epithel, UA 0-10 (A) None seen /hpf   Mucus, UA Present Not Estab.   Bacteria, UA Many (A) None seen/Few  Urinalysis, Complete  Result Value Ref Range   Specific Gravity, UA 1.025 1.005 - 1.030   pH, UA 5.0 5.0 - 7.5   Color, UA Yellow Yellow   Appearance Ur Clear Clear   Leukocytes, UA Trace (A) Negative   Protein, UA 1+ (A) Negative/Trace   Glucose, UA Negative Negative   Ketones, UA Negative Negative   RBC, UA Negative Negative   Bilirubin, UA Negative Negative   Urobilinogen, Ur 0.2 0.2 - 1.0 mg/dL   Nitrite, UA Negative Negative   Microscopic Examination See below:       Assessment & Plan:   1. Dysuria - Urine Culture - Urinalysis, Complete - Ambulatory referral to Urology - Microscopic Examination  2. Weakness of right hip - Ambulatory referral to Orthopedic Surgery  3. Right hip pain -  Ambulatory referral to Orthopedic Surgery  4. Acute cystitis without hematuria - Ambulatory referral to Urology   Continue all other maintenance medications as listed above.  Follow up plan: No follow-ups on file.  Educational handout given for survey  Terald Sleeper  PA-C [facility] [address]  [place], [place] [address] [phone]   04/24/2018, 1:10 AM

## 2018-04-28 ENCOUNTER — Telehealth: Payer: Self-pay | Admitting: *Deleted
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
## 2018-04-28 NOTE — Telephone Encounter (Signed)
Letter wrote and patient aware.

## 2018-04-28 NOTE — Telephone Encounter (Signed)
ok 

## 2018-05-02 DIAGNOSIS — Z85828 Personal history of other malignant neoplasm of skin: Secondary | ICD-10-CM | POA: Diagnosis not present

## 2018-05-02 DIAGNOSIS — L570 Actinic keratosis: Secondary | ICD-10-CM | POA: Diagnosis not present

## 2018-05-05 ENCOUNTER — Encounter: Payer: Self-pay | Admitting: Physician Assistant

## 2018-05-05 ENCOUNTER — Ambulatory Visit (INDEPENDENT_AMBULATORY_CARE_PROVIDER_SITE_OTHER): Payer: Medicare Other | Admitting: Physician Assistant

## 2018-05-05 ENCOUNTER — Other Ambulatory Visit: Payer: Self-pay

## 2018-05-05 VITALS — BP 111/66 | HR 73 | Temp 98.5°F | Ht 60.0 in

## 2018-05-05 DIAGNOSIS — G8929 Other chronic pain: Secondary | ICD-10-CM

## 2018-05-05 DIAGNOSIS — M545 Low back pain, unspecified: Secondary | ICD-10-CM

## 2018-05-05 DIAGNOSIS — S20212D Contusion of left front wall of thorax, subsequent encounter: Secondary | ICD-10-CM

## 2018-05-05 DIAGNOSIS — M542 Cervicalgia: Secondary | ICD-10-CM

## 2018-05-05 MED ORDER — HYDROCODONE-ACETAMINOPHEN 10-325 MG PO TABS: 1.0000 | ORAL_TABLET | Freq: Three times a day (TID) | ORAL | 0 refills | Status: DC | PRN

## 2018-05-05 MED ORDER — PANTOPRAZOLE SODIUM 20 MG PO TBEC: 20.0000 mg | DELAYED_RELEASE_TABLET | Freq: Every day | ORAL | 3 refills | Status: DC

## 2018-05-05 NOTE — Patient Instructions (Signed)
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
## 2018-06-22 ENCOUNTER — Encounter: Payer: Self-pay | Admitting: Nurse Practitioner

## 2018-06-22 ENCOUNTER — Other Ambulatory Visit: Payer: Self-pay

## 2018-06-22 ENCOUNTER — Ambulatory Visit (INDEPENDENT_AMBULATORY_CARE_PROVIDER_SITE_OTHER): Payer: Medicare Other | Admitting: Nurse Practitioner

## 2018-06-22 DIAGNOSIS — R42 Dizziness and giddiness: Secondary | ICD-10-CM | POA: Diagnosis not present

## 2018-06-22 DIAGNOSIS — I959 Hypotension, unspecified: Secondary | ICD-10-CM | POA: Insufficient documentation

## 2018-06-22 MED ORDER — MECLIZINE HCL 25 MG PO TABS: 25.0000 mg | ORAL_TABLET | Freq: Three times a day (TID) | ORAL | 0 refills | Status: DC | PRN

## 2018-06-22 NOTE — Progress Notes (Signed)
   Virtual Visit via telephone Note  I connected with Robin [name] on 06/22/18 at 10:45 AM by telephone and verified that I am speaking with the correct person using two identifiers. Robin [name] is currently located at home and her son is currently with her during visit. The provider, Mary-Margaret Hassell Done, FNP is located in their office at time of visit.  I discussed the limitations, risks, security and privacy concerns of performing an evaluation and management service by telephone and the availability of in person appointments. I also discussed with the patient that there may be a patient responsible charge related to this service. The patient expressed understanding and agreed to proceed.   History and Present Illness:  Patient calls in today c/o low blood pressure. She says that yesterday morning she was feeling fine and at breakfast. She fell asleep shorty after that and she said when she woke up she was dizzy and was seeing double. Was afraid she would fall. She had a pain in back of her head. Blood pressure was 97/46. Last several hours. Blood pressure finally came up to 111/67. She stopped taking her HCTZ  About 1 month ago so she is on no blood pressure meds. Today she feels a little dizziness and vision is normal.   Review of Systems  Constitutional: Negative.   HENT: Negative.   Respiratory: Negative.   Cardiovascular: Negative.   Genitourinary: Negative.   Skin: Negative.   Neurological: Positive for dizziness. Negative for tremors, weakness and headaches.  Psychiatric/Behavioral: Negative.   All other systems reviewed and are negative.    Observations/Objective: Alert and oriented- answers all questions appropriately No distress  Assessment and Plan: Robin [name] in today with chief complaint of No chief complaint on file.   1. Dizziness Fall precautions discussed - meclizine (ANTIVERT) 25 MG tablet; Take 1 tablet (25 mg total) by mouth 3 (three)  times daily as needed for dizziness.  Dispense: 30 tablet; Refill: 0  2. Hypotension, unspecified hypotension type Add a little salt to diet Keep diary of blood pressure   Follow Up Instructions: Prn with ARonnald Ramp    I discussed the assessment and treatment plan with the patient. The patient was provided an opportunity to ask questions and all were answered. The patient agreed with the plan and demonstrated an understanding of the instructions.   The patient was advised to call back or seek an in-person evaluation if the symptoms worsen or if the condition fails to improve as anticipated.  The above assessment and management plan was discussed with the patient. The patient verbalized understanding of and has agreed to the management plan. Patient is aware to call the clinic if symptoms persist or worsen. Patient is aware when to return to the clinic for a follow-up visit. Patient educated on when it is appropriate to go to the emergency department.   Time call ended:  11:05  I provided   20 minutes of non-face-to-face time during this encounter.    Mary-Margaret Hassell Done, FNP

## 2018-06-27 ENCOUNTER — Telehealth: Payer: Self-pay

## 2018-06-27 NOTE — Telephone Encounter (Signed)
FYI, patient cancelled and did not reschedule their urology appointment.

## 2018-07-08 DIAGNOSIS — K6289 Other specified diseases of anus and rectum: Secondary | ICD-10-CM | POA: Diagnosis not present

## 2018-07-08 DIAGNOSIS — K5641 Fecal impaction: Secondary | ICD-10-CM | POA: Diagnosis not present

## 2018-07-08 DIAGNOSIS — K649 Unspecified hemorrhoids: Secondary | ICD-10-CM | POA: Diagnosis not present

## 2018-07-11 ENCOUNTER — Telehealth: Payer: Self-pay | Admitting: Physician Assistant

## 2018-07-11 NOTE — Telephone Encounter (Signed)
appt made Tuesday AM - going out of town Tuesday evening

## 2018-07-17 ENCOUNTER — Other Ambulatory Visit: Payer: Self-pay

## 2018-07-18 ENCOUNTER — Encounter: Payer: Self-pay | Admitting: Physician Assistant

## 2018-07-18 ENCOUNTER — Ambulatory Visit (INDEPENDENT_AMBULATORY_CARE_PROVIDER_SITE_OTHER): Payer: Medicare Other | Admitting: Physician Assistant

## 2018-07-18 DIAGNOSIS — F411 Generalized anxiety disorder: Secondary | ICD-10-CM | POA: Diagnosis not present

## 2018-07-18 DIAGNOSIS — M545 Low back pain, unspecified: Secondary | ICD-10-CM | POA: Insufficient documentation

## 2018-07-18 DIAGNOSIS — M542 Cervicalgia: Secondary | ICD-10-CM

## 2018-07-18 DIAGNOSIS — S20212D Contusion of left front wall of thorax, subsequent encounter: Secondary | ICD-10-CM

## 2018-07-18 DIAGNOSIS — G8929 Other chronic pain: Secondary | ICD-10-CM

## 2018-07-18 MED ORDER — HYDROCODONE-ACETAMINOPHEN 10-325 MG PO TABS: 1.0000 | ORAL_TABLET | Freq: Three times a day (TID) | ORAL | 0 refills | Status: DC | PRN

## 2018-07-18 MED ORDER — ALPRAZOLAM 0.5 MG PO TABS: 0.2500 mg | ORAL_TABLET | Freq: Every evening | ORAL | 5 refills | Status: DC | PRN

## 2018-07-18 NOTE — Progress Notes (Signed)
LME 0.5 MME 30    BP 138/64   Pulse (!) 58   Temp 98 F (36.7 C) (Oral)   Ht 5' (1.524 m)   Wt 113 lb (51.3 kg)   BMI 22.07 kg/m    Subjective:    Patient ID: Robin [name], female    DOB: November 16, 1940, 78 y.o.   MRN: [id number]  HPI: Robin [name] is a 78 y.o. female presenting on 07/18/2018 for Hip Pain  This patient comes in for chronic recheck on her hip pain, back pain.  She has some days that she does get quite stiff.  But she is trying to move around.  Their family is going to try to go on vacation and she is excited about this.  She denies any fever chills or changes in her skin or joints.  ANXIETY ASSESSMENT Cause of anxiety: GAD This patient returns for a  month recheck on narcotic use for the above named condition(s)  Current medications-alprazolam 0.5 mg 1 at bedtime Other medications tried: SSRIs, trazodone Medication side effects-none Any concerns-no Any change in general medical condition-no Effectiveness of current meds-good PMP AWARE website reviewed: Yes Any suspicious activity on PMP Aware: No LME daily dose: 0.5  Contract on file 12/22/2017 Last UDS obtained at next visit  History of overdose or risk of abuse no  Patient has been taking Norco 10/325 up to twice daily for severe pain.  This is not can become a chronic medication.  However because of her GERD and stomach issues she is unable to take an NSAID.  Refills of the Norco are sent to the pharmacy.   Past Medical History:  Diagnosis Date  . Arthritis   . Hypercholesteremia   . Hypertension   . Pneumonia   . Reflux    Relevant past medical, surgical, family and social history reviewed and updated as indicated. Interim medical history since our last visit reviewed. Allergies and medications reviewed and updated. DATA REVIEWED: CHART IN EPIC  Family History reviewed for pertinent findings.  Review of Systems  Constitutional: Negative.   HENT: Negative.   Eyes: Negative.    Respiratory: Negative.   Gastrointestinal: Negative.   Genitourinary: Negative.   Musculoskeletal: Positive for arthralgias, back pain, joint swelling, myalgias and neck pain.    Allergies as of 07/18/2018      Reactions   Dilaudid [hydromorphone Hcl] Nausea And Vomiting   Lisinopril Cough   Valium [diazepam] Other (See Comments)   Family states patient has adverse reaction and displays confusion.       Medication List       Accurate as of July 18, 2018  9:22 AM. If you have any questions, ask your nurse or doctor.        albuterol 108 (90 Base) MCG/ACT inhaler Commonly known as:  VENTOLIN HFA Inhale 2 puffs into the lungs every 6 (six) hours as needed for wheezing or shortness of breath.   ALPRAZolam 0.5 MG tablet Commonly known as:  XANAX Take 0.5 tablets (0.25 mg total) by mouth at bedtime as needed for anxiety.   aspirin 81 MG tablet Take 81 mg by mouth daily.   atorvastatin 40 MG tablet Commonly known as:  LIPITOR Take 1 tablet (40 mg total) by mouth daily.   cetirizine 10 MG tablet Commonly known as:  ZYRTEC Take 1 tablet (10 mg total) by mouth daily as needed for allergies.   cholecalciferol 1000 units tablet Commonly known as:  VITAMIN D Take 1,000 Units by  mouth daily.   hydrochlorothiazide 25 MG tablet Commonly known as:  HYDRODIURIL Take 1 tablet (25 mg total) by mouth daily.   HYDROcodone-acetaminophen 10-325 MG tablet Commonly known as:  NORCO Take 1 tablet by mouth every 8 (eight) hours as needed.   HYDROcodone-acetaminophen 10-325 MG tablet Commonly known as:  NORCO Take 1 tablet by mouth every 8 (eight) hours as needed for moderate pain.   meclizine 25 MG tablet Commonly known as:  ANTIVERT Take 1 tablet (25 mg total) by mouth 3 (three) times daily as needed for dizziness.   pantoprazole 20 MG tablet Commonly known as:  PROTONIX Take 1 tablet (20 mg total) by mouth daily.          Objective:    BP 138/64   Pulse (!) 58   Temp 98 F  (36.7 C) (Oral)   Ht 5' (1.524 m)   Wt 113 lb (51.3 kg)   BMI 22.07 kg/m   Allergies  Allergen Reactions  . Dilaudid [Hydromorphone Hcl] Nausea And Vomiting  . Lisinopril Cough  . Valium [Diazepam] Other (See Comments)    Family states patient has adverse reaction and displays confusion.     Wt Readings from Last 3 Encounters:  07/18/18 113 lb (51.3 kg)  04/21/18 122 lb 9.6 oz (55.6 kg)  04/06/18 119 lb (54 kg)    Physical Exam Constitutional:      Appearance: She is well-developed.  HENT:     Head: Normocephalic and atraumatic.  Eyes:     Conjunctiva/sclera: Conjunctivae normal.     Pupils: Pupils are equal, round, and reactive to light.  Cardiovascular:     Rate and Rhythm: Normal rate and regular rhythm.     Heart sounds: Normal heart sounds.  Pulmonary:     Effort: Pulmonary effort is normal.     Breath sounds: Normal breath sounds.  Abdominal:     General: Bowel sounds are normal.     Palpations: Abdomen is soft.  Musculoskeletal:     Right hip: She exhibits decreased range of motion, decreased strength and tenderness.       Legs:  Skin:    General: Skin is warm and dry.     Findings: No rash.  Neurological:     Mental Status: She is alert and oriented to person, place, and time.     Deep Tendon Reflexes: Reflexes are normal and symmetric.  Psychiatric:        Behavior: Behavior normal.        Thought Content: Thought content normal.        Judgment: Judgment normal.         Assessment & Plan:   1. GAD (generalized anxiety disorder) - ALPRAZolam (XANAX) 0.5 MG tablet; Take 0.5 tablets (0.25 mg total) by mouth at bedtime as needed for anxiety.  Dispense: 30 tablet; Refill: 5  2. Chest wall contusion, left, subsequent encounter - HYDROcodone-acetaminophen (NORCO) 10-325 MG tablet; Take 1 tablet by mouth every 8 (eight) hours as needed.  Dispense: 60 tablet; Refill: 0  3. Cervical pain - HYDROcodone-acetaminophen (NORCO) 10-325 MG tablet; Take 1  tablet by mouth every 8 (eight) hours as needed.  Dispense: 60 tablet; Refill: 0  4. Chronic right-sided low back pain without sciatica - HYDROcodone-acetaminophen (NORCO) 10-325 MG tablet; Take 1 tablet by mouth every 8 (eight) hours as needed.  Dispense: 60 tablet; Refill: 0 - HYDROcodone-acetaminophen (NORCO) 10-325 MG tablet; Take 1 tablet by mouth every 8 (eight) hours as needed for moderate  pain.  Dispense: 60 tablet; Refill: 0   Continue all other maintenance medications as listed above.  Follow up plan: Recheck in 3 months  Educational handout given for [place] PA-C [facility] [address]  [place], [place] [address] [phone]   07/18/2018, 9:22 AM

## 2018-09-04 ENCOUNTER — Other Ambulatory Visit: Payer: Self-pay

## 2018-09-04 ENCOUNTER — Ambulatory Visit (INDEPENDENT_AMBULATORY_CARE_PROVIDER_SITE_OTHER): Payer: Medicare Other | Admitting: Physician Assistant

## 2018-09-04 ENCOUNTER — Encounter: Payer: Self-pay | Admitting: Physician Assistant

## 2018-09-04 VITALS — BP 136/70 | HR 58 | Temp 97.2°F | Wt 113.0 lb

## 2018-09-04 DIAGNOSIS — M25551 Pain in right hip: Secondary | ICD-10-CM | POA: Diagnosis not present

## 2018-09-04 MED ORDER — METHYLPREDNISOLONE ACETATE 80 MG/ML IJ SUSP
80.0000 mg | Freq: Once | INTRAMUSCULAR | Status: AC
  Administered 2018-09-04: 80 mg via INTRAMUSCULAR

## 2018-09-06 ENCOUNTER — Observation Stay (HOSPITAL_COMMUNITY)
Admission: EM | Admit: 2018-09-06 | Discharge: 2018-09-08 | Disposition: A | Discharge status: Home / Self Care | Payer: Medicare Other | Attending: Internal Medicine | Admitting: Internal Medicine

## 2018-09-06 ENCOUNTER — Other Ambulatory Visit: Payer: Self-pay

## 2018-09-06 ENCOUNTER — Emergency Department (HOSPITAL_COMMUNITY): Payer: Medicare Other

## 2018-09-06 ENCOUNTER — Encounter (HOSPITAL_COMMUNITY): Payer: Self-pay

## 2018-09-06 DIAGNOSIS — Z7982 Long term (current) use of aspirin: Secondary | ICD-10-CM | POA: Insufficient documentation

## 2018-09-06 DIAGNOSIS — N183 Chronic kidney disease, stage 3 unspecified: Secondary | ICD-10-CM | POA: Diagnosis present

## 2018-09-06 DIAGNOSIS — Z85828 Personal history of other malignant neoplasm of skin: Secondary | ICD-10-CM | POA: Insufficient documentation

## 2018-09-06 DIAGNOSIS — R2681 Unsteadiness on feet: Secondary | ICD-10-CM

## 2018-09-06 DIAGNOSIS — I639 Cerebral infarction, unspecified: Secondary | ICD-10-CM | POA: Diagnosis not present

## 2018-09-06 DIAGNOSIS — Z03818 Encounter for observation for suspected exposure to other biological agents ruled out: Secondary | ICD-10-CM | POA: Diagnosis not present

## 2018-09-06 DIAGNOSIS — Z79899 Other long term (current) drug therapy: Secondary | ICD-10-CM | POA: Diagnosis not present

## 2018-09-06 DIAGNOSIS — E7800 Pure hypercholesterolemia, unspecified: Secondary | ICD-10-CM | POA: Insufficient documentation

## 2018-09-06 DIAGNOSIS — R29810 Facial weakness: Secondary | ICD-10-CM | POA: Diagnosis not present

## 2018-09-06 DIAGNOSIS — Z1159 Encounter for screening for other viral diseases: Secondary | ICD-10-CM | POA: Insufficient documentation

## 2018-09-06 DIAGNOSIS — Z87891 Personal history of nicotine dependence: Secondary | ICD-10-CM | POA: Diagnosis not present

## 2018-09-06 DIAGNOSIS — K219 Gastro-esophageal reflux disease without esophagitis: Secondary | ICD-10-CM | POA: Insufficient documentation

## 2018-09-06 DIAGNOSIS — E785 Hyperlipidemia, unspecified: Secondary | ICD-10-CM | POA: Diagnosis not present

## 2018-09-06 DIAGNOSIS — F411 Generalized anxiety disorder: Secondary | ICD-10-CM | POA: Insufficient documentation

## 2018-09-06 DIAGNOSIS — Z8673 Personal history of transient ischemic attack (TIA), and cerebral infarction without residual deficits: Secondary | ICD-10-CM | POA: Insufficient documentation

## 2018-09-06 DIAGNOSIS — I129 Hypertensive chronic kidney disease with stage 1 through stage 4 chronic kidney disease, or unspecified chronic kidney disease: Secondary | ICD-10-CM | POA: Diagnosis not present

## 2018-09-06 DIAGNOSIS — I10 Essential (primary) hypertension: Secondary | ICD-10-CM | POA: Diagnosis not present

## 2018-09-06 DIAGNOSIS — R4781 Slurred speech: Secondary | ICD-10-CM | POA: Diagnosis not present

## 2018-09-06 DIAGNOSIS — R531 Weakness: Secondary | ICD-10-CM | POA: Diagnosis not present

## 2018-09-06 HISTORY — DX: Cerebral infarction, unspecified: I63.9

## 2018-09-06 LAB — DIFFERENTIAL
Abs Immature Granulocytes: 0.02 10*3/uL (ref 0.00–0.07)
Basophils Absolute: 0 10*3/uL (ref 0.0–0.1)
Basophils Relative: 1 %
Eosinophils Absolute: 0 10*3/uL (ref 0.0–0.5)
Eosinophils Relative: 1 %
Immature Granulocytes: 0 %
Lymphocytes Relative: 24 %
Lymphs Abs: 1.6 10*3/uL (ref 0.7–4.0)
Monocytes Absolute: 0.5 10*3/uL (ref 0.1–1.0)
Monocytes Relative: 7 %
Neutro Abs: 4.5 10*3/uL (ref 1.7–7.7)
Neutrophils Relative %: 67 %

## 2018-09-06 LAB — URINALYSIS, ROUTINE W REFLEX MICROSCOPIC
Bilirubin Urine: NEGATIVE
Glucose, UA: NEGATIVE mg/dL
Hgb urine dipstick: NEGATIVE
Ketones, ur: NEGATIVE mg/dL
Leukocytes,Ua: NEGATIVE
Nitrite: NEGATIVE
Protein, ur: NEGATIVE mg/dL
Specific Gravity, Urine: 1.003 — ABNORMAL LOW (ref 1.005–1.030)
pH: 6 (ref 5.0–8.0)

## 2018-09-06 LAB — CBC
HCT: 31.7 % — ABNORMAL LOW (ref 36.0–46.0)
Hemoglobin: 9.9 g/dL — ABNORMAL LOW (ref 12.0–15.0)
MCH: 29.2 pg (ref 26.0–34.0)
MCHC: 31.2 g/dL (ref 30.0–36.0)
MCV: 93.5 fL (ref 80.0–100.0)
Platelets: 283 10*3/uL (ref 150–400)
RBC: 3.39 MIL/uL — ABNORMAL LOW (ref 3.87–5.11)
RDW: 15.1 % (ref 11.5–15.5)
WBC: 6.6 10*3/uL (ref 4.0–10.5)
nRBC: 0 % (ref 0.0–0.2)

## 2018-09-06 LAB — PROTIME-INR
INR: 0.9 (ref 0.8–1.2)
Prothrombin Time: 12.4 seconds (ref 11.4–15.2)

## 2018-09-06 LAB — RAPID URINE DRUG SCREEN, HOSP PERFORMED
Amphetamines: NOT DETECTED
Barbiturates: NOT DETECTED
Benzodiazepines: NOT DETECTED
Cocaine: NOT DETECTED
Opiates: NOT DETECTED
Tetrahydrocannabinol: NOT DETECTED

## 2018-09-06 LAB — COMPREHENSIVE METABOLIC PANEL WITH GFR
ALT: 9 U/L (ref 0–44)
AST: 14 U/L — ABNORMAL LOW (ref 15–41)
Albumin: 3.8 g/dL (ref 3.5–5.0)
Alkaline Phosphatase: 97 U/L (ref 38–126)
Anion gap: 10 (ref 5–15)
BUN: 14 mg/dL (ref 8–23)
CO2: 23 mmol/L (ref 22–32)
Calcium: 9.3 mg/dL (ref 8.9–10.3)
Chloride: 107 mmol/L (ref 98–111)
Creatinine, Ser: 1.23 mg/dL — ABNORMAL HIGH (ref 0.44–1.00)
GFR calc Af Amer: 49 mL/min — ABNORMAL LOW
GFR calc non Af Amer: 42 mL/min — ABNORMAL LOW
Glucose, Bld: 92 mg/dL (ref 70–99)
Potassium: 3.4 mmol/L — ABNORMAL LOW (ref 3.5–5.1)
Sodium: 140 mmol/L (ref 135–145)
Total Bilirubin: 0.7 mg/dL (ref 0.3–1.2)
Total Protein: 6.6 g/dL (ref 6.5–8.1)

## 2018-09-06 LAB — ETHANOL: Alcohol, Ethyl (B): <10 mg/dL (ref <10)

## 2018-09-06 LAB — SARS CORONAVIRUS 2 BY RT PCR (HOSPITAL ORDER, PERFORMED IN ~~LOC~~ HOSPITAL LAB): SARS Coronavirus 2: NEGATIVE

## 2018-09-06 LAB — APTT: aPTT: 28 seconds (ref 24–36)

## 2018-09-06 LAB — CBG MONITORING, ED: Glucose-Capillary: 83 mg/dL (ref 70–99)

## 2018-09-06 MED ORDER — ALPRAZOLAM 0.25 MG PO TABS
0.2500 mg | ORAL_TABLET | Freq: Every day | ORAL | Status: DC
  Administered 2018-09-07 (×2): 0.25 mg via ORAL
  Filled 2018-09-06 (×2): qty 1

## 2018-09-06 MED ORDER — ACETAMINOPHEN 650 MG RE SUPP: 650.0000 mg | RECTAL | Status: DC | PRN

## 2018-09-06 MED ORDER — ASPIRIN 81 MG PO CHEW: 81.0000 mg | CHEWABLE_TABLET | Freq: Every day | ORAL | Status: DC

## 2018-09-06 MED ORDER — STROKE: EARLY STAGES OF RECOVERY BOOK
Freq: Once | Status: DC
  Filled 2018-09-06: qty 1

## 2018-09-06 MED ORDER — HYDROCODONE-ACETAMINOPHEN 10-325 MG PO TABS: 1.0000 | ORAL_TABLET | Freq: Three times a day (TID) | ORAL | Status: DC | PRN

## 2018-09-06 MED ORDER — ALBUTEROL SULFATE (2.5 MG/3ML) 0.083% IN NEBU: 3.0000 mL | INHALATION_SOLUTION | Freq: Four times a day (QID) | RESPIRATORY_TRACT | Status: DC | PRN

## 2018-09-06 MED ORDER — ENOXAPARIN SODIUM 30 MG/0.3ML ~~LOC~~ SOLN
30.0000 mg | SUBCUTANEOUS | Status: DC
  Administered 2018-09-07 – 2018-09-08 (×2): 30 mg via SUBCUTANEOUS
  Filled 2018-09-06 (×2): qty 0.3

## 2018-09-06 MED ORDER — PANTOPRAZOLE SODIUM 40 MG PO TBEC
20.0000 mg | DELAYED_RELEASE_TABLET | Freq: Every day | ORAL | Status: DC
  Administered 2018-09-07 – 2018-09-08 (×2): 40 mg via ORAL
  Filled 2018-09-06 (×4): qty 1

## 2018-09-06 MED ORDER — DOCUSATE SODIUM 100 MG PO CAPS
100.0000 mg | ORAL_CAPSULE | Freq: Every day | ORAL | Status: DC
  Administered 2018-09-07: 100 mg via ORAL
  Filled 2018-09-06 (×2): qty 1

## 2018-09-06 MED ORDER — ASPIRIN 81 MG PO CHEW
324.0000 mg | CHEWABLE_TABLET | Freq: Once | ORAL | Status: AC
  Administered 2018-09-06: 324 mg via ORAL
  Filled 2018-09-06: qty 4

## 2018-09-06 MED ORDER — ACETAMINOPHEN 160 MG/5ML PO SOLN: 650.0000 mg | ORAL | Status: DC | PRN

## 2018-09-06 MED ORDER — ACETAMINOPHEN 325 MG PO TABS: 650.0000 mg | ORAL_TABLET | ORAL | Status: DC | PRN

## 2018-09-06 NOTE — ED Notes (Signed)
Patient drinking fluids normally with no issues. Speech is clear. Patient does still have some deficits to the right side of her face. All other areas normal and no deficits.

## 2018-09-06 NOTE — ED Triage Notes (Signed)
Pt reports that she was seen by her daughter at 25 and was normal. Pt reports daughter had come home at 3 pm and she staggered to door and daughter noted right sided weakness, right droop and slurred speech to let daughter in. Pt hasnt seen anyone or looked at herself in mirror. . Says she sat in chair at 130 pm. Pt complainig of headache

## 2018-09-06 NOTE — Consult Note (Signed)
[facility] Tele[facility]urology Consult Services   Date of Service:   09/06/2018 15:31:40  Impression:     .  Rule Out Acute Ischemic Stroke  Comments/Sign-Out: 78 YO F with h/o HTN, HLD and stroke 12 years ago, arthritis presented with right facial droop, slurred speech and right sided weakness. R/O stroke  Metrics: Last Known Well: 09/06/2018 13:30:00 [facility] Notification Time: 09/06/2018 15:31:40 Arrival Time: 09/06/2018 15:13:00 Stamp Time: 09/06/2018 15:31:40 Telephone Response Time: 09/06/2018 15:35:26 Time First Login Attempt: 09/06/2018 15:35:26 Video Start Time: 09/06/2018 15:40:35  Symptoms: right facial droop, slurred speech and right sided weakness. NIHSS Start Assessment Time: 09/06/2018 16:00:02 Patient is not a candidate for tPA. Patient was not deemed candidate for tPA thrombolytics because of TPA was offered but patient refused. She understood that we might not be able to offer this to her later. . Video End Time: 09/06/2018 15:54:37  CT head was reviewed and results were: 78 YO F with h/o HTN, HLD and stroke 12 years ago, arthritis presented with right facial droop, slurred speech and right sided weakness.  Clinical Presentation is not Suggestive of Large Vessel Occlusive Disease  ED Physician notified of diagnostic impression and management plan on 09/06/2018 15:59:33  Our recommendations are outlined below.  Recommendations:     .  Activate Stroke Protocol Admission/Order Set     .  Stroke/Telemetry Floor     .  Neuro Checks     .  Bedside Swallow Eval     .  DVT Prophylaxis     .  IV Fluids, Normal Saline     .  Head of Bed 30 Degrees     .  Euglycemia and Avoid Hyperthermia (PRN Acetaminophen)  Routine Consultation with [facility] for Follow up Care  Sign Out:     .  Discussed with Emergency Department Provider    ------------------------------------------------------------------------------  History of Present Illness:  Patient is a 78 year old Female.  Patient was brought by EMS for symptoms of right facial droop, slurred speech and right sided weakness.  78 YO F with h/o HTN, HLD and stroke 12 years ago, arthritis presented with right facial droop, slurred speech and right sided weakness. She states that she was walking around fine at 13:30 and sat on a chair when she got up from the chair at 3 PM she had trouble walking. Her daughter noticed right facial droop and slurred speech.  There is no history of hemorrhagic complications or intracranial hemorrhage. There is no history of Recent Anticoagulants. There is no history of recent major surgery. There is no history of recent stroke.  Examination: BP(133/70), 1A: Level of Consciousness - Alert; keenly responsive + 0 1B: Ask Month and Age - Both Questions Right + 0 1C: Blink Eyes & Squeeze Hands - Performs Both Tasks + 0 2: Test Horizontal Extraocular Movements - Normal + 0 3: Test Visual Fields - No Visual Loss + 0 4: Test Facial Palsy (Use Grimace if Obtunded) - Minor paralysis (flat nasolabial fold, smile asymmetry) + 1 5A: Test Left Arm Motor Drift - No Drift for 10 Seconds + 0 5B: Test Right Arm Motor Drift - No Drift for 10 Seconds + 0 6A: Test Left Leg Motor Drift - Drift, but doesn't hit bed + 1 6B: Test Right Leg Motor Drift - Drift, but doesn't hit bed + 1 7: Test Limb Ataxia (FNF/Heel-Shin) - No Ataxia + 0 8: Test Sensation - Mild-Moderate Loss: Less Sharp/More Dull + 1 9: Test Language/Aphasia -  Normal; No aphasia + 0 10: Test Dysarthria - Normal + 0 11: Test Extinction/Inattention - No abnormality + 0  NIHSS Score: 4  Patient/Family was informed the Neurology Consult would happen via TeleHealth consult by way of interactive audio and video telecommunications and consented to receiving care in this manner.  Due to the immediate potential for life-threatening deterioration due to underlying acute neurologic illness, I spent 35 minutes  providing critical care. This time includes time for face to face visit via telemedicine, review of medical records, imaging studies and discussion of findings with providers, the patient and/or family.   Dr Beau Fanny   [facility] [phone]  Case [id number]

## 2018-09-06 NOTE — ED Provider Notes (Signed)
[facility] EMERGENCY DEPARTMENT Provider Note   CSN: [id number] Arrival date & time: 09/06/18  1518     History   Chief Complaint No chief complaint on file.   HPI Robin [name] is a 78 y.o. female.     HPI   Code Stroke. Brought in by EMS. 78yF. Last known normal at 1400 today. Noticed speech was slurred. R facial weakness. R arm sided weakness. Glucose in 100s.  Symptoms persistent since. No acute pain. No visual changes. Past hx of HTN and HLD. Reports hx of CVA 12y ago but no residual deficits. Not anticoagulated.   Past Medical History:  Diagnosis Date  . Arthritis   . Hypercholesteremia   . Hypertension   . Pneumonia   . Reflux     Patient Active Problem List   Diagnosis Date Noted  . Chronic right-sided low back pain without sciatica 07/18/2018  . Hypotension 06/22/2018  . Intractable chronic post-traumatic headache 02/27/2018  . Cervical pain 02/27/2018  . GAD (generalized anxiety disorder) 06/21/2017  . Toe deformity, acquired, left 12/22/2016  . Plantar fasciitis of left foot 12/22/2016  . 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 Basal cell carcinoma of skin of left ear and external auricular canal 06/24/2016  . Anemia, iron deficiency 02/18/2016  . Hyperlipidemia 11/17/2015  . Osteoarthritis of left hip 11/17/2015  . Chronic kidney disease (CKD), stage III (moderate) ([place]) 03/15/2014  . Gastro-esophageal reflux disease without esophagitis 03/15/2014  . Calculus of kidney 10/04/2012    Past Surgical History:  Procedure Laterality Date  . APPENDECTOMY    . CATARACT EXTRACTION W/PHACO Left 12/19/2017   Procedure: CATARACT EXTRACTION PHACO AND INTRAOCULAR LENS PLACEMENT LEFT EYE;  Surgeon: Tonny Branch, MD;  Location: [facility];  Service: Ophthalmology;  Laterality: Left;  CDE: 26.71  . CATARACT EXTRACTION W/PHACO Right 01/02/2018   Procedure: CATARACT EXTRACTION PHACO AND INTRAOCULAR LENS PLACEMENT (IOC);  Surgeon: Tonny Branch,  MD;  Location: [facility];  Service: Ophthalmology;  Laterality: Right;  CDE: 21.87  . JOINT REPLACEMENT    . TOTAL HIP ARTHROPLASTY Right 2012  . TUBAL LIGATION       OB History    Gravida  4   Para  4   Term  4   Preterm      AB      Living  3     SAB      TAB      Ectopic      Multiple      Live Births               Home Medications    Prior to Admission medications   Medication Sig Start Date End Date Taking? Authorizing Provider  albuterol (PROVENTIL HFA;VENTOLIN HFA) 108 (90 Base) MCG/ACT inhaler Inhale 2 puffs into the lungs every 6 (six) hours as needed for wheezing or shortness of breath. 02/20/18   Terald Sleeper, PA-C  ALPRAZolam Duanne Moron) 0.5 MG tablet Take 0.5 tablets (0.25 mg total) by mouth at bedtime as needed for anxiety. 07/18/18   Terald Sleeper, PA-C  aspirin 81 MG tablet Take 81 mg by mouth daily.    [provider]  atorvastatin (LIPITOR) 40 MG tablet Take 1 tablet (40 mg total) by mouth daily. 12/20/17   Terald Sleeper, PA-C  cetirizine (ZYRTEC) 10 MG tablet Take 1 tablet (10 mg total) by mouth daily as needed for allergies. 12/20/17   Terald Sleeper, PA-C  cholecalciferol (VITAMIN D) 1000 UNITS tablet Take 1,000 Units by mouth daily.    [provider]  hydrochlorothiazide (HYDRODIURIL) 25 MG tablet Take 1 tablet (25 mg total) by mouth daily. 12/20/17   Terald Sleeper, PA-C  HYDROcodone-acetaminophen (NORCO) 10-325 MG tablet Take 1 tablet by mouth every 8 (eight) hours as needed. 07/18/18   Terald Sleeper, PA-C  HYDROcodone-acetaminophen (NORCO) 10-325 MG tablet Take 1 tablet by mouth every 8 (eight) hours as needed for moderate pain. 07/18/18   Terald Sleeper, PA-C  meclizine (ANTIVERT) 25 MG tablet Take 1 tablet (25 mg total) by mouth 3 (three) times daily as needed for dizziness. 06/22/18   Hassell Done, Mary-Margaret, FNP  pantoprazole (PROTONIX) 20 MG tablet Take 1 tablet (20 mg total) by mouth daily. 05/05/18   Terald Sleeper, PA-C     Family History Family History  Problem Relation Age of Onset  . Diabetes Mother   . Diabetes Sister   . Healthy Daughter   . Healthy Son   . Cancer Brother   . Healthy Daughter     Social History Social History   Tobacco Use  . Smoking status: Former Smoker    Packs/day: 0.25    Years: 5.00    Pack years: 1.25    Types: Cigarettes    Quit date: 06/30/1977    Years since quitting: 41.2  . Smokeless tobacco: Never Used  Substance Use Topics  . Alcohol use: No  . Drug use: No     Allergies   Dilaudid [hydromorphone hcl], Lisinopril, and Valium [diazepam]   Review of Systems Review of Systems  All systems reviewed and negative, other than as noted in HPI.  Physical Exam Updated Vital Signs There were no vitals taken for this visit.  Physical Exam Vitals signs and nursing note reviewed.  Constitutional:      General: She is not in acute distress.    Appearance: She is well-developed.  HENT:     Head: Normocephalic and atraumatic.  Eyes:     General:        Right eye: No discharge.        Left eye: No discharge.     Conjunctiva/sclera: Conjunctivae normal.  Neck:     Musculoskeletal: Neck supple.  Cardiovascular:     Rate and Rhythm: Normal rate and regular rhythm.     Heart sounds: Normal heart sounds. No murmur. No friction rub. No gallop.   Pulmonary:     Effort: Pulmonary effort is normal. No respiratory distress.     Breath sounds: Normal breath sounds.  Abdominal:     General: There is no distension.     Palpations: Abdomen is soft.     Tenderness: There is no abdominal tenderness.  Musculoskeletal:        General: No tenderness.  Skin:    General: Skin is warm and dry.  Neurological:     Mental Status: She is alert.     Comments: R facial weakness. CN 2-12 otherwise intact. Speech is fluent. AOx3. Following commands. Strength 5/5 b/l UE. 4/5 b/l LE.   Psychiatric:        Behavior: Behavior normal.        Thought Content: Thought content  normal.      ED Treatments / Results  Labs (all labs ordered are listed, but only abnormal results are displayed) Labs Reviewed  CBC - Abnormal; Notable for the following components:      Result Value   RBC  3.39 (*)    Hemoglobin 9.9 (*)    HCT 31.7 (*)    All other components within normal limits  COMPREHENSIVE METABOLIC PANEL - Abnormal; Notable for the following components:   Potassium 3.4 (*)    Creatinine, Ser 1.23 (*)    AST 14 (*)    GFR calc non Af Amer 42 (*)    GFR calc Af Amer 49 (*)    All other components within normal limits  URINALYSIS, ROUTINE W REFLEX MICROSCOPIC - Abnormal; Notable for the following components:   Color, Urine STRAW (*)    Specific Gravity, Urine 1.003 (*)    All other components within normal limits  SARS CORONAVIRUS 2 (HOSPITAL ORDER, PERFORMED IN [facility])  ETHANOL  PROTIME-INR  APTT  DIFFERENTIAL  RAPID URINE DRUG SCREEN, HOSP PERFORMED  CBG MONITORING, ED  I-STAT CHEM 8, ED    EKG EKG Interpretation  Date/Time:  Wednesday September 06 2018 15:44:21 EDT Ventricular Rate:  77 PR Interval:    QRS Duration: 88 QT Interval:  373 QTC Calculation: 423 R Axis:   58 Text Interpretation:  Sinus rhythm Confirmed by Virgel Manifold [phone]) on 09/06/2018 5:20:15 PM   Radiology Ct Head Code Stroke Wo Contrast  Result Date: 09/06/2018 CLINICAL DATA:  Code stroke. Facial droop. Right-sided weakness. Slurred speech. Symptoms began 30 minutes ago. EXAM: CT HEAD WITHOUT CONTRAST TECHNIQUE: Contiguous axial images were obtained from the base of the skull through the vertex without intravenous contrast. COMPARISON:  08/04/2014 FINDINGS: Brain: No sign of acute infarction, mass lesion, hemorrhage, hydrocephalus or extra-axial collection. Mild chronic small-vessel change of the hemispheric [place] matter. Vascular: There is atherosclerotic calcification of the major vessels at the base of the brain. Skull: Negative Sinuses/Orbits:  Clear/normal Other: None ASPECTS ([place] Stroke Program Early CT Score) - Ganglionic level infarction (caudate, lentiform nuclei, internal capsule, insula, M1-M3 cortex): 7 - Supraganglionic infarction (M4-M6 cortex): 3 Total score (0-10 with 10 being normal): 10 IMPRESSION: 1. No acute finding. Chronic small-vessel ischemic change of the [place] matter. 2. ASPECTS is 10 3. These results were called by telephone at the time of interpretation on 09/06/2018 at 3:28 pm to Dr. Virgel Manifold , who verbally acknowledged these results. Electronically Signed   By: Nelson Chimes M.D.   On: 09/06/2018 15:30    Procedures Procedures (including critical care time)  CRITICAL CARE Performed by: Virgel Manifold Total critical care time: 35 minutes Critical care time was exclusive of separately billable procedures and treating other patients. Critical care was necessary to treat or prevent imminent or life-threatening deterioration. Critical care was time spent personally by me on the following activities: development of treatment plan with patient and/or surrogate as well as nursing, discussions with consultants, evaluation of patient's response to treatment, examination of patient, obtaining history from patient or surrogate, ordering and performing treatments and interventions, ordering and review of laboratory studies, ordering and review of radiographic studies, pulse oximetry and re-evaluation of patient's condition.   Medications Ordered in ED Medications - No data to display   Initial Impression / Assessment and Plan / ED Course  I have reviewed the triage vital signs and the nursing notes.  Pertinent labs & imaging results that were available during my care of the patient were reviewed by me and considered in my medical decision making (see chart for details).    78yF with stroke like symptoms. LKN at 1400 today. Briefly assessed on arrival. Symptoms concerning for CVA. Pt to CT. Teleneurology  consultation.   3:36 PM Able to get collateral information from daughter. She says last saw pt normal at 0430 today prior to going to work. She was up sitting in a chair. Daughter came home from work shortly before 1500 today and patient staggered to the door and she noticed the symptoms then. Pt has been the chair the entire time. Patient says she noticed the symptoms between 1330-1400.   Seen by teleneurology. Regardless of actual timeline, pt was offered TPa and declined. Will admit for CVA w/u.   Final Clinical Impressions(s) / ED Diagnoses   Final diagnoses:  Cerebrovascular accident (CVA), unspecified mechanism [facility])    ED Discharge Orders    None       Virgel Manifold, MD 09/06/18 1733

## 2018-09-06 NOTE — ED Notes (Signed)
Code stroke paged. Patient transported to CT at this time.

## 2018-09-06 NOTE — ED Notes (Signed)
Pt being assessed by EDP at this time.

## 2018-09-06 NOTE — H&P (Signed)
History and Physical:    Robin [name]   OFB:[id number] DOB: 1940-03-03 DOA: 09/06/2018  Referring MD/provider: Dr. Wilson Singer PCP: Terald Sleeper, PA-C   Patient coming from: Home  Chief Complaint: Acute onset of gait instability  History of Present Illness:   Robin [name] is an 78 y.o. female with past medical history significant for hypertension and hyperlipidemia who was in her usual state of health until around 3:00 today when she woke up from a nap and noted she had difficulty walking to the door.  Patient states that she felt "out of my head, I knew where I was but I did not feel like I was in my body".  Patient states that "I could not get my legs to do what they needed to do".  She states that she finally made it to the door to open it, when her daughter saw her she stated, what is the matter with you mom" and called EMS.  Patient was seen in the ED where a code stroke was called and telemetry neurology recommended possible TPA, however patient declined the TPA.  At present patient states that she feels improved.  She notes that her head no longer feels abnormal.   She is having no difficulty with comprehension or with speech.  She has no apparent numbness or weakness on any particular side of her body.She has not yet tried to walk.   Patient denies any fevers or chills or any acute intercurrent illness.  Patient states that she was at baseline until she woke up and was unable to walk properly.  No cough.  No exposure to COVID.   ROS:   ROS   Review of Systems: General: No fever, chills, weight changes Skin: No rashes, lesions, wounds Endocrine: no heat/cold intolerance, no polyuria Respiratory: No cough,, shortness of breath, hemoptysis Cardiovascular: No palpitations, chest pain GI: No nausea, vomiting, diarrhea, constipation GU: No dysuria, increased frequency Musculoskeletal: No back pain, joint pain Blood/lymphatics: No easy bruising, bleeding Mood/affect:  No anxiety/depression    Past Medical History:   Past Medical History:  Diagnosis Date   Arthritis    Hypercholesteremia    Hypertension    Pneumonia    Reflux     Past Surgical History:   Past Surgical History:  Procedure Laterality Date   APPENDECTOMY     CATARACT EXTRACTION W/PHACO Left 12/19/2017   Procedure: CATARACT EXTRACTION PHACO AND INTRAOCULAR LENS PLACEMENT LEFT EYE;  Surgeon: Tonny Branch, MD;  Location: [facility];  Service: Ophthalmology;  Laterality: Left;  CDE: 26.71   CATARACT EXTRACTION W/PHACO Right 01/02/2018   Procedure: CATARACT EXTRACTION PHACO AND INTRAOCULAR LENS PLACEMENT (IOC);  Surgeon: Tonny Branch, MD;  Location: [facility];  Service: Ophthalmology;  Laterality: Right;  CDE: 21.87   JOINT REPLACEMENT     TOTAL HIP ARTHROPLASTY Right 2012   TUBAL LIGATION      Social History:   Social History   Socioeconomic History   Marital status: Widowed    Spouse name: Not on file   Number of children: 4   Years of education: 11   Highest education level: 11th grade  Occupational History   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 [place] resource strain: Not very hard   Food insecurity    Worry: Never true    Inability: Never true   Transportation needs    Medical: No    Non-medical: No  Tobacco Use   Smoking status: Former Smoker    Packs/day: 0.25    Years: 5.00    Pack years: 1.25    Types: Cigarettes    Quit date: 06/30/1977    Years since quitting: 41.2   Smokeless tobacco: Never Used  Substance and Sexual Activity   Alcohol use: No   Drug use: No   Sexual activity: Not Currently  Lifestyle   Physical activity    Days per week: 7 days    Minutes per session: 90 min   Stress: Not at all  Relationships   Social connections    Talks on phone: More than three times a week    Gets together: More than three times a week    Attends religious service: Never     Active member of club or organization: No    Attends meetings of clubs or organizations: Never    Relationship status: Widowed   Intimate partner violence    Fear of current or ex partner: No    Emotionally abused: No    Physically abused: No    Forced sexual activity: No  Other Topics Concern   Not on file  Social History Narrative   Not on file    Allergies   Dilaudid [hydromorphone hcl], Lisinopril, and Valium [diazepam]  Family history:   Family History  Problem Relation Age of Onset   Diabetes Mother    Diabetes Sister    Healthy Daughter    Healthy Son    Cancer Brother    Healthy Daughter     Current Medications:   Prior to Admission medications   Medication Sig Start Date End Date Taking? Authorizing Provider  albuterol (PROVENTIL HFA;VENTOLIN HFA) 108 (90 Base) MCG/ACT inhaler Inhale 2 puffs into the lungs every 6 (six) hours as needed for wheezing or shortness of breath. 02/20/18  Yes Terald Sleeper, PA-C  ALPRAZolam Duanne Moron) 0.5 MG tablet Take 0.5 tablets (0.25 mg total) by mouth at bedtime as needed for anxiety. Patient taking differently: Take 0.25-0.5 mg by mouth at bedtime.  07/18/18  Yes Terald Sleeper, PA-C  aspirin 81 MG tablet Take 81 mg by mouth at bedtime.    Yes [provider]  Cholecalciferol (VITAMIN D) 125 MCG (5000 UT) CAPS Take 5,000 Units by mouth every morning.    Yes [provider]  docusate sodium (COLACE) 100 MG capsule Take 100 mg by mouth daily.   Yes [provider]  HYDROcodone-acetaminophen (NORCO) 10-325 MG tablet Take 1 tablet by mouth every 8 (eight) hours as needed. Patient taking differently: Take 1 tablet by mouth every 8 (eight) hours as needed for moderate pain or severe pain.  07/18/18  Yes Terald Sleeper, PA-C  pantoprazole (PROTONIX) 20 MG tablet Take 1 tablet (20 mg total) by mouth daily. 05/05/18  Yes Terald Sleeper, PA-C  HYDROcodone-acetaminophen (NORCO) 10-325 MG tablet Take 1 tablet by  mouth every 8 (eight) hours as needed for moderate pain. Patient not taking: Reported on 09/06/2018 07/18/18   Terald Sleeper, PA-C  meclizine (ANTIVERT) 25 MG tablet Take 1 tablet (25 mg total) by mouth 3 (three) times daily as needed for dizziness. Patient not taking: Reported on 09/06/2018 06/22/18   Chevis Pretty, FNP    Physical Exam:   Vitals:   09/06/18 1631 09/06/18 1700 09/06/18 1730 09/06/18 1800  BP: 137/65 126/63 (!) 141/73 133/65  Pulse: 65 61 66   Resp: 14 16 17 19   Temp:  TempSrc:      SpO2: 100%     Weight:         Physical Exam: Blood pressure 133/65, pulse 66, temperature 98.3 F (36.8 C), temperature source Oral, resp. rate 19, weight 52.3 kg, SpO2 100 %. Gen: Relatively well-appearing patient in reasonable spirits with her sister at bedside lying in bed in no acute distress. Eyes: Sclerae anicteric. Conjunctiva mildly injected. Chest: Moderately good air entry bilaterally with no adventitious sounds.  CV: Distant, regular, no audible murmurs. Abdomen: NABS, soft, nondistended, nontender.  Extremities: No edema.  Skin: Warm and dry. No rashes, lesions or wounds. Neuro: Alert and oriented times 3; patient is able to move all extremities without difficulty.  She has 5 out of 5 strength in upper and lower extremities grossly.  She has normal speech with no dysarthria.  No comprehension difficulties.  I am unable to appreciate a facial droop.  She is able to cross midline.  No dysmetria in upper extremities. Psych: Patient is cooperative, logical and coherent with appropriate mood and affect.  Data Review:    Labs: Basic Metabolic Panel: Recent Labs  Lab 09/06/18 1532  NA 140  K 3.4*  CL 107  CO2 23  GLUCOSE 92  BUN 14  CREATININE 1.23*  CALCIUM 9.3   Liver Function Tests: Recent Labs  Lab 09/06/18 1532  AST 14*  ALT 9  ALKPHOS 97  BILITOT 0.7  PROT 6.6  ALBUMIN 3.8   No results for input(s): LIPASE, AMYLASE in the last 168 hours. No  results for input(s): AMMONIA in the last 168 hours. CBC: Recent Labs  Lab 09/06/18 1532  WBC 6.6  NEUTROABS 4.5  HGB 9.9*  HCT 31.7*  MCV 93.5  PLT 283   Cardiac Enzymes: No results for input(s): CKTOTAL, CKMB, CKMBINDEX, TROPONINI in the last 168 hours.  BNP (last 3 results) No results for input(s): PROBNP in the last 8760 hours. CBG: Recent Labs  Lab 09/06/18 1603  GLUCAP 83    Urinalysis    Component Value Date/Time   COLORURINE STRAW (A) 09/06/2018 1520   APPEARANCEUR CLEAR 09/06/2018 1520   APPEARANCEUR Clear 04/21/2018 0918   LABSPEC 1.003 (L) 09/06/2018 1520   PHURINE 6.0 09/06/2018 1520   GLUCOSEU NEGATIVE 09/06/2018 1520   HGBUR NEGATIVE 09/06/2018 1520   BILIRUBINUR NEGATIVE 09/06/2018 1520   BILIRUBINUR Negative 04/21/2018 0918   KETONESUR NEGATIVE 09/06/2018 1520   PROTEINUR NEGATIVE 09/06/2018 1520   UROBILINOGEN 1.0 08/04/2014 1012   NITRITE NEGATIVE 09/06/2018 1520   LEUKOCYTESUR NEGATIVE 09/06/2018 1520      Radiographic Studies: Ct Head Code Stroke Wo Contrast  Result Date: 09/06/2018 CLINICAL DATA:  Code stroke. Facial droop. Right-sided weakness. Slurred speech. Symptoms began 30 minutes ago. EXAM: CT HEAD WITHOUT CONTRAST TECHNIQUE: Contiguous axial images were obtained from the base of the skull through the vertex without intravenous contrast. COMPARISON:  08/04/2014 FINDINGS: Brain: No sign of acute infarction, mass lesion, hemorrhage, hydrocephalus or extra-axial collection. Mild chronic small-vessel change of the hemispheric [place] matter. Vascular: There is atherosclerotic calcification of the major vessels at the base of the brain. Skull: Negative Sinuses/Orbits: Clear/normal Other: None ASPECTS ([place] Stroke Program Early CT Score) - Ganglionic level infarction (caudate, lentiform nuclei, internal capsule, insula, M1-M3 cortex): 7 - Supraganglionic infarction (M4-M6 cortex): 3 Total score (0-10 with 10 being normal): 10 IMPRESSION: 1. No  acute finding. Chronic small-vessel ischemic change of the [place] matter. 2. ASPECTS is 10 3. These results were called by telephone at  the time of interpretation on 09/06/2018 at 3:28 pm to Dr. Virgel Manifold , who verbally acknowledged these results. Electronically Signed   By: Nelson Chimes M.D.   On: 09/06/2018 15:30    EKG: Independently reviewed.  Sinus rhythm at 60.  Normal intervals.  Normal axis.  No acute ST-T wave changes.     Assessment/Plan:   Principal Problem:   Stroke (cerebrum) (HCC) Active Problems:   Chronic kidney disease (CKD), stage III (moderate) (HCC)   GAD (generalized anxiety disorder)  78 year old female sounds like she had a stroke earlier today although CT is negative.    CVA Patient was offered TPA which she declined, and indicates her symptom seems to be improving significantly We will allow for permissive hypertension although she is not hypertensive at present Patient was given aspirin 325 in the ED, will continue aspirin 81 mg daily, further modification of antiplatelet therapy per neurology follow-up. MRI brain, carotid Dopplers and echocardiogram ordered. PT, OT and speech therapy requested  CKD 3 Creatinine is actually improved from January of this year with decreased from 2.6-1.3 today. Continue to avoid renal toxins  GAD Continue alprazolam per home doses  GERD Continue pantoprazole     Other information:   DVT prophylaxis: Lovenox ordered. Code Status: Full code. Family Communication: Patient sister was at bedside throughout history and physical Disposition Plan: Home Consults called: Neurology Admission status: Observation  The medical decision making on this patient was of high complexity and the patient is at high risk for clinical deterioration, therefore this is a level 3 visit.  Dewaine Oats Tublu Pranit Owensby [facility]  If 7PM-7AM, please contact night-coverage [email or web address] Password [facility] 09/06/2018, 9:21 PM

## 2018-09-06 NOTE — ED Notes (Signed)
Daughter notified patient going to ICU room 4. Contact [name]) [phone] patient's daughter.

## 2018-09-06 NOTE — ED Notes (Signed)
Pt refused TPA x2 while speaking to neurologist

## 2018-09-06 NOTE — ED Notes (Signed)
Marie [phone]

## 2018-09-07 ENCOUNTER — Observation Stay (HOSPITAL_COMMUNITY): Payer: Medicare Other

## 2018-09-07 ENCOUNTER — Observation Stay (HOSPITAL_BASED_OUTPATIENT_CLINIC_OR_DEPARTMENT_OTHER): Payer: Medicare Other

## 2018-09-07 DIAGNOSIS — R2689 Other abnormalities of gait and mobility: Secondary | ICD-10-CM | POA: Diagnosis not present

## 2018-09-07 DIAGNOSIS — R269 Unspecified abnormalities of gait and mobility: Secondary | ICD-10-CM | POA: Diagnosis not present

## 2018-09-07 DIAGNOSIS — R51 Headache: Secondary | ICD-10-CM | POA: Diagnosis not present

## 2018-09-07 DIAGNOSIS — G935 Compression of brain: Secondary | ICD-10-CM | POA: Diagnosis not present

## 2018-09-07 DIAGNOSIS — I6523 Occlusion and stenosis of bilateral carotid arteries: Secondary | ICD-10-CM | POA: Diagnosis not present

## 2018-09-07 DIAGNOSIS — I639 Cerebral infarction, unspecified: Secondary | ICD-10-CM

## 2018-09-07 LAB — LIPID PANEL
Cholesterol: 168 mg/dL (ref 0–200)
HDL: 72 mg/dL (ref >40)
LDL Cholesterol: 85 mg/dL (ref 0–99)
Total CHOL/HDL Ratio: 2.3 RATIO
Triglycerides: 56 mg/dL (ref <150)
VLDL: 11 mg/dL (ref 0–40)

## 2018-09-07 LAB — ECHOCARDIOGRAM COMPLETE: Weight: 1844.81 oz

## 2018-09-07 LAB — HEMOGLOBIN A1C
Hgb A1c MFr Bld: 5.2 % (ref 4.8–5.6)
Mean Plasma Glucose: 102.54 mg/dL

## 2018-09-07 LAB — MRSA PCR SCREENING: MRSA by PCR: NEGATIVE

## 2018-09-07 MED ORDER — ASPIRIN 81 MG PO CHEW
324.0000 mg | CHEWABLE_TABLET | Freq: Every day | ORAL | Status: DC
  Administered 2018-09-07: 324 mg via ORAL
  Filled 2018-09-07: qty 4

## 2018-09-07 MED ORDER — CLOPIDOGREL BISULFATE 75 MG PO TABS
75.0000 mg | ORAL_TABLET | Freq: Every day | ORAL | Status: DC
  Administered 2018-09-08: 75 mg via ORAL
  Filled 2018-09-07: qty 1

## 2018-09-07 MED ORDER — ATORVASTATIN CALCIUM 40 MG PO TABS
40.0000 mg | ORAL_TABLET | Freq: Every day | ORAL | Status: DC
  Administered 2018-09-07: 40 mg via ORAL
  Filled 2018-09-07: qty 1

## 2018-09-07 MED ORDER — ASPIRIN 325 MG PO TABS
325.0000 mg | ORAL_TABLET | Freq: Every day | ORAL | Status: DC
  Administered 2018-09-07 – 2018-09-08 (×2): 325 mg via ORAL
  Filled 2018-09-07 (×2): qty 1

## 2018-09-07 MED ORDER — SODIUM CHLORIDE 0.9 % IV SOLN
INTRAVENOUS | Status: AC
  Administered 2018-09-07: 08:00:00 via INTRAVENOUS

## 2018-09-07 NOTE — Progress Notes (Signed)
*  PRELIMINARY RESULTS* Echocardiogram 2D Echocardiogram has been performed.  Alyssamarie, M[place] 09/07/2018, 11:57 AM

## 2018-09-07 NOTE — Care Management Obs Status (Signed)
[place] NOTIFICATION   Patient Details  Name: Robin [name] MRN: [id number] Date of Birth: [birth year]   Medicare Observation Status Notification Given:  Yes    Sherald Barge, RN 09/07/2018, 11:58 AM

## 2018-09-07 NOTE — Progress Notes (Signed)
SLP Cancellation Note  Patient Details Name: Robin [name] MRN: [id number] DOB: 1940-07-06   Cancelled treatment:       Reason Eval/Treat Not Completed: SLP screened, no needs identified, will sign off; SLP screened Pt in room. Pt denies any changes in swallowing, speech, language, or cognition. MRI negative for acute changes. SLE will be deferred at this time. Reconsult if indicated. SLP will sign off.  Thank you,  Genene Churn, [place]    [place] 09/07/2018, 1:00 PM

## 2018-09-07 NOTE — Consult Note (Addendum)
[place] A. Merlene Laughter, MD     [email or web address]          Robin Robin [name] is an 78 y.o. Robin [name].   ASSESSMENT/PLAN: The patient symptomatology seems most consistent with a TIA.  This seems to be moderate size vessel event most likely.  Dual antiplatelet agents are recommended for 1 month.  Subsequently, aspirin 325 is recommended.  Brain MRA is also recommended.    The patient is an 78 year old Robin Robin [name] who presents with acute onset of disequilibrium, headaches and heaviness of the legs lasting for about 15 minutes.  There may have been some dysarthria but this is not certain.  She denies numbness or heaviness on one side.  She denies any diplopia or dysphasia.  She typically functions fairly well.  She mostly ambulates without assistive devices.  She does not report cognitive issues and is usually highly functional.  She is on 81 mg aspirin and has been compliant with this.  She reports feeling back to her baseline.  This is the first event of this sort.  She does not have episodic dizziness or gait instability.  She does not report spinning sensation or necessary lightheadedness but more gait instability/disequilibrium.  She does had significant right hip pain apparently from arthritis in the right hip.  She has had total of the past 2 there.  She reports having weakness on the right side involving the right lower extremity.  The patient is noted to have slightly injected right eyes.  She tells me is been there recently.  There is some itching to.  The review of systems otherwise negative.    GENERAL: This a pleasant thin Robin [name] in no acute distress.  HEENT: Neck is supple no trauma appreciated.  There is mild injection of the right eye associated with mild ptosis.  ABDOMEN: soft  EXTREMITIES: No edema; there is slightly reduced range of motion of the right hip particularly flexion of the hip.  BACK: Normal  SKIN: Normal by inspection.    MENTAL STATUS:  Alert and oriented -including her age and the month. Speech, language and cognition are generally intact. Judgment and insight normal.   CRANIAL NERVES: Pupils are equal, round and reactive to light and accomodation; extra ocular movements are full, there is no significant nystagmus; visual fields are full; upper and lower facial muscles are normal in strength and symmetric, there is no flattening of the nasolabial folds; tongue is midline; uvula is midline; shoulder elevation is normal.  MOTOR: Normal tone, bulk and strength; no pronator drift.  There is mild drift of the right lower extremity.  Otherwise there is no drift of the other extremities.  There is some mild hip flexion weakness on the right graded as 4/5.  Dorsiflexion is 5.  The other extremity shows normal tone, bulk and strength.  COORDINATION: Left finger to nose is normal, right finger to nose is normal, No rest tremor; no intention tremor; no postural tremor; no bradykinesia.  REFLEXES: Deep tendon reflexes are symmetrical and normal. Plantar reflexes are flexor bilaterally.   SENSATION: Normal to light touch, temperature, and pain.     NIH stroke scale 1.   Blood pressure 139/61, pulse 61, temperature 97.9 F (36.6 C), temperature source Oral, resp. rate 20, weight 52.3 kg, SpO2 97 %.  Past Medical History:  Diagnosis Date  . Arthritis   . Hypercholesteremia   . Hypertension   . Pneumonia   . Reflux     Past Surgical History:  Procedure  Laterality Date  . APPENDECTOMY    . CATARACT EXTRACTION W/PHACO Left 12/19/2017   Procedure: CATARACT EXTRACTION PHACO AND INTRAOCULAR LENS PLACEMENT LEFT EYE;  Surgeon: Tonny Branch, MD;  Location: [facility];  Service: Ophthalmology;  Laterality: Left;  CDE: 26.71  . CATARACT EXTRACTION W/PHACO Right 01/02/2018   Procedure: CATARACT EXTRACTION PHACO AND INTRAOCULAR LENS PLACEMENT (IOC);  Surgeon: Tonny Branch, MD;  Location: [facility];  Service: Ophthalmology;  Laterality: Right;  CDE:  21.87  . JOINT REPLACEMENT    . TOTAL HIP ARTHROPLASTY Right 2012  . TUBAL LIGATION      Family History  Problem Relation Age of Onset  . Diabetes Mother   . Diabetes Sister   . Healthy Daughter   . Healthy Son   . Cancer Brother   . Healthy Daughter     Social History:  reports that she quit smoking about 41 years ago. Her smoking use included cigarettes. She has a 1.25 pack-year smoking history. She has never used smokeless tobacco. She reports that she does not drink alcohol or use drugs.  Allergies:  Allergies  Allergen Reactions  . Dilaudid [Hydromorphone Hcl] Nausea And Vomiting  . Lisinopril Cough  . Valium [Diazepam] Other (See Comments)    Family states patient has adverse reaction and displays confusion.     Medications: Prior to Admission medications   Medication Sig Start Date End Date Taking? Authorizing Provider  albuterol (PROVENTIL HFA;VENTOLIN HFA) 108 (90 Base) MCG/ACT inhaler Inhale 2 puffs into the lungs every 6 (six) hours as needed for wheezing or shortness of breath. 02/20/18  Yes Terald Sleeper, PA-C  ALPRAZolam Duanne Moron) 0.5 MG tablet Take 0.5 tablets (0.25 mg total) by mouth at bedtime as needed for anxiety. Patient taking differently: Take 0.25-0.5 mg by mouth at bedtime.  07/18/18  Yes Terald Sleeper, PA-C  aspirin 81 MG tablet Take 81 mg by mouth at bedtime.    Yes [provider]  Cholecalciferol (VITAMIN D) 125 MCG (5000 UT) CAPS Take 5,000 Units by mouth every morning.    Yes [provider]  docusate sodium (COLACE) 100 MG capsule Take 100 mg by mouth daily.   Yes [provider]  HYDROcodone-acetaminophen (NORCO) 10-325 MG tablet Take 1 tablet by mouth every 8 (eight) hours as needed. Patient taking differently: Take 1 tablet by mouth every 8 (eight) hours as needed for moderate pain or severe pain.  07/18/18  Yes Terald Sleeper, PA-C  pantoprazole (PROTONIX) 20 MG tablet Take 1 tablet (20 mg total) by mouth daily. 05/05/18   Yes Terald Sleeper, PA-C  HYDROcodone-acetaminophen (NORCO) 10-325 MG tablet Take 1 tablet by mouth every 8 (eight) hours as needed for moderate pain. Patient not taking: Reported on 09/06/2018 07/18/18   Terald Sleeper, PA-C  meclizine (ANTIVERT) 25 MG tablet Take 1 tablet (25 mg total) by mouth 3 (three) times daily as needed for dizziness. Patient not taking: Reported on 09/06/2018 06/22/18   Chevis Pretty, FNP    Scheduled Meds: .  stroke: mapping our early stages of recovery book   Does not apply Once  . ALPRAZolam  0.25 mg Oral QHS  . aspirin  324 mg Oral Daily  . atorvastatin  40 mg Oral q1800  . docusate sodium  100 mg Oral Daily  . enoxaparin (LOVENOX) injection  30 mg Subcutaneous Q24H  . pantoprazole  40 mg Oral Daily   Continuous Infusions: . sodium chloride 75 mL/hr at 09/07/18 0814   PRN Meds:.acetaminophen **  OR** acetaminophen (TYLENOL) oral liquid 160 mg/5 mL **OR** acetaminophen, albuterol, HYDROcodone-acetaminophen     Results for orders placed or performed during the hospital encounter of 09/06/18 (from the past 48 hour(s))  Urine rapid drug screen (hosp performed)     Status: None   Collection Time: 09/06/18  3:20 PM  Result Value Ref Range   Opiates NONE DETECTED NONE DETECTED   Cocaine NONE DETECTED NONE DETECTED   Benzodiazepines NONE DETECTED NONE DETECTED   Amphetamines NONE DETECTED NONE DETECTED   Tetrahydrocannabinol NONE DETECTED NONE DETECTED   Barbiturates NONE DETECTED NONE DETECTED    Comment: (NOTE) DRUG SCREEN FOR MEDICAL PURPOSES ONLY.  IF CONFIRMATION IS NEEDED FOR ANY PURPOSE, NOTIFY LAB WITHIN 5 DAYS. LOWEST DETECTABLE LIMITS FOR URINE DRUG SCREEN Drug Class                     Cutoff (ng/mL) Amphetamine and metabolites    1000 Barbiturate and metabolites    200 Benzodiazepine                 833 Tricyclics and metabolites     300 Opiates and metabolites        300 Cocaine and metabolites        300 THC                             50 Performed at [facility], [address] [place], [place] [address]   Urinalysis, Routine w reflex microscopic     Status: Abnormal   Collection Time: 09/06/18  3:20 PM  Result Value Ref Range   Color, Urine STRAW (A) YELLOW   APPearance CLEAR CLEAR   Specific Gravity, Urine 1.003 (L) 1.005 - 1.030   pH 6.0 5.0 - 8.0   Glucose, UA NEGATIVE NEGATIVE mg/dL   Hgb urine dipstick NEGATIVE NEGATIVE   Bilirubin Urine NEGATIVE NEGATIVE   Ketones, ur NEGATIVE NEGATIVE mg/dL   Protein, ur NEGATIVE NEGATIVE mg/dL   Nitrite NEGATIVE NEGATIVE   Leukocytes,Ua NEGATIVE NEGATIVE    Comment: Performed at [facility], [address], [place], [place] [address]  SARS Coronavirus 2 (CEPHEID - Performed in [place] hospital lab), Hosp Order     Status: None   Collection Time: 09/06/18  3:27 PM   Specimen: Nasopharyngeal Swab  Result Value Ref Range   SARS Coronavirus 2 NEGATIVE NEGATIVE    Comment: (NOTE) If result is NEGATIVE SARS-CoV-2 target nucleic acids are NOT DETECTED. The SARS-CoV-2 RNA is generally detectable in upper and lower  respiratory specimens during the acute phase of infection. The lowest  concentration of SARS-CoV-2 viral copies this assay can detect is 250  copies / mL. A negative result does not preclude SARS-CoV-2 infection  and should not be used as the sole basis for treatment or other  patient management decisions.  A negative result may occur with  improper specimen collection / handling, submission of specimen other  than nasopharyngeal swab, presence of viral mutation(s) within the  areas targeted by this assay, and inadequate number of viral copies  (<250 copies / mL). A negative result must be combined with clinical  observations, patient history, and epidemiological information. If result is POSITIVE SARS-CoV-2 target nucleic acids are DETECTED. The SARS-CoV-2 RNA is generally detectable in upper and lower  respiratory specimens dur ing the acute phase of  infection.  Positive  results are indicative of active infection with SARS-CoV-2.  Clinical  correlation with patient history and other diagnostic information is  necessary to determine patient infection status.  Positive results do  not rule out bacterial infection or co-infection with other viruses. If result is PRESUMPTIVE POSTIVE SARS-CoV-2 nucleic acids MAY BE PRESENT.   A presumptive positive result was obtained on the submitted specimen  and confirmed on repeat testing.  While 2019 novel coronavirus  (SARS-CoV-2) nucleic acids may be present in the submitted sample  additional confirmatory testing may be necessary for epidemiological  and / or clinical management purposes  to differentiate between  SARS-CoV-2 and other Sarbecovirus currently known to infect humans.  If clinically indicated additional testing with an alternate test  methodology [phone]) is advised. The SARS-CoV-2 RNA is generally  detectable in upper and lower respiratory sp ecimens during the acute  phase of infection. The expected result is Negative. Fact Sheet for Patients:  StrictlyIdeas.no Fact Sheet for Healthcare Providers: BankingDealers.co.za This test is not yet approved or cleared by the [place] FDA and has been authorized for detection and/or diagnosis of SARS-CoV-2 by FDA under an Emergency Use Authorization (EUA).  This EUA will remain in effect (meaning this test can be used) for the duration of the COVID-19 declaration under Section 564(b)(1) of the Act, 21 U.S.C. section 360bbb-3(b)(1), unless the authorization is terminated or revoked sooner. Performed at [facility], [address] [place], [place] [address]   Ethanol     Status: None   Collection Time: 09/06/18  3:32 PM  Result Value Ref Range   Alcohol, Ethyl (B) <10 <10 mg/dL    Comment: (NOTE) Lowest detectable limit for serum alcohol is 10 mg/dL. For medical purposes only.  Performed at [facility], [address], [place], [place] [address]   Protime-INR     Status: None   Collection Time: 09/06/18  3:32 PM  Result Value Ref Range   Prothrombin Time 12.4 11.4 - 15.2 seconds   INR 0.9 0.8 - 1.2    Comment: (NOTE) INR goal varies based on device and disease states. Performed at [facility], [address] [place], [place] [address]   APTT     Status: None   Collection Time: 09/06/18  3:32 PM  Result Value Ref Range   aPTT 28 24 - 36 seconds    Comment: Performed at [facility], [address] [place], [place] [address]  CBC     Status: Abnormal   Collection Time: 09/06/18  3:32 PM  Result Value Ref Range   WBC 6.6 4.0 - 10.5 K/uL   RBC 3.39 (L) 3.87 - 5.11 MIL/uL   Hemoglobin 9.9 (L) 12.0 - 15.0 g/dL   HCT 31.7 (L) 36.0 - 46.0 %   MCV 93.5 80.0 - 100.0 fL   MCH 29.2 26.0 - 34.0 pg   MCHC 31.2 30.0 - 36.0 g/dL   RDW 15.1 11.5 - 15.5 %   Platelets 283 150 - 400 K/uL   nRBC 0.0 0.0 - 0.2 %    Comment: Performed at [facility], [address], [place],  [address]  Differential     Status: None   Collection Time: 09/06/18  3:32 PM  Result Value Ref Range   Neutrophils Relative % 67 %   Neutro Abs 4.5 1.7 - 7.7 K/uL   Lymphocytes Relative 24 %   Lymphs Abs 1.6 0.7 - 4.0 K/uL   Monocytes Relative 7 %   Monocytes Absolute 0.5 0.1 - 1.0 K/uL   Eosinophils Relative 1 %  Eosinophils Absolute 0.0 0.0 - 0.5 K/uL   Basophils Relative 1 %   Basophils Absolute 0.0 0.0 - 0.1 K/uL   Immature Granulocytes 0 %   Abs Immature Granulocytes 0.02 0.00 - 0.07 K/uL    Comment: Performed at [facility], [address] [place], [place] [address]  Comprehensive metabolic panel     Status: Abnormal   Collection Time: 09/06/18  3:32 PM  Result Value Ref Range   Sodium 140 135 - 145 mmol/L   Potassium 3.4 (L) 3.5 - 5.1 mmol/L   Chloride 107 98 - 111 mmol/L   CO2 23 22 - 32 mmol/L   Glucose, Bld 92 70 - 99 mg/dL   BUN 14 8 - 23 mg/dL   Creatinine, Ser  1.23 (H) 0.44 - 1.00 mg/dL   Calcium 9.3 8.9 - 10.3 mg/dL   Total Protein 6.6 6.5 - 8.1 g/dL   Albumin 3.8 3.5 - 5.0 g/dL   AST 14 (L) 15 - 41 U/L   ALT 9 0 - 44 U/L   Alkaline Phosphatase 97 38 - 126 U/L   Total Bilirubin 0.7 0.3 - 1.2 mg/dL   GFR calc non Af Amer 42 (L) >60 mL/min   GFR calc Af Amer 49 (L) >60 mL/min   Anion gap 10 5 - 15    Comment: Performed at [facility], [address], [place], [place] [address]  CBG monitoring, ED     Status: None   Collection Time: 09/06/18  4:03 PM  Result Value Ref Range   Glucose-Capillary 83 70 - 99 mg/dL  MRSA PCR Screening     Status: None   Collection Time: 09/06/18 11:56 PM   Specimen: Nasopharyngeal  Result Value Ref Range   MRSA by PCR NEGATIVE NEGATIVE    Comment:        The GeneXpert MRSA Assay (FDA approved for NASAL specimens only), is one component of a comprehensive MRSA colonization surveillance program. It is not intended to diagnose MRSA infection nor to guide or monitor treatment for MRSA infections. Performed at [facility], [address] [place], [place] [address]   Hemoglobin A1c     Status: None   Collection Time: 09/07/18  4:32 AM  Result Value Ref Range   Hgb A1c MFr Bld 5.2 4.8 - 5.6 %    Comment: (NOTE) Pre diabetes:          5.7%-6.4% Diabetes:              >6.4% Glycemic control for   <7.0% adults with diabetes    Mean Plasma Glucose 102.54 mg/dL    Comment: Performed at [facility] [address] [place], [place] [address]  Lipid panel     Status: None   Collection Time: 09/07/18  4:32 AM  Result Value Ref Range   Cholesterol 168 0 - 200 mg/dL   Triglycerides 56 <150 mg/dL   HDL 72 >40 mg/dL   Total CHOL/HDL Ratio 2.3 RATIO   VLDL 11 0 - 40 mg/dL   LDL Cholesterol 85 0 - 99 mg/dL    Comment:        Total Cholesterol/HDL:CHD Risk Coronary Heart Disease Risk Table                     Men   Women  1/2 Average Risk   3.4   3.3  Average Risk       5.0   4.4  2 X Average  Risk    9.6   7.1  3 X Average Risk  23.4   11.0        Use the calculated Patient Ratio above and the CHD Risk Table to determine the patient's CHD Risk.        ATP III CLASSIFICATION (LDL):  <100     mg/dL   Optimal  100-129  mg/dL   Near or Above                    Optimal  130-159  mg/dL   Borderline  160-189  mg/dL   High  >190     mg/dL   Very High Performed at [facility] [place], [place] [address]     Studies/Results:  BRAIN MRI FINDINGS: Brain: The cerebellar tonsils extend 1.6 cm below the foramen magnum with a pointed appearance. There is no evidence of acute infarct, intracranial hemorrhage, mass, midline shift, or extra-axial fluid collection. There is mild cerebral atrophy. Minimal periventricular Heberlein matter T2 hyperintensities are not considered abnormal for age.  Vascular: Major intracranial vascular flow voids are preserved.  Skull and upper cervical spine: Unremarkable bone marrow signal.  Sinuses/Orbits: Bilateral cataract extraction. Trace left mastoid effusion. Clear paranasal sinuses.  Other: None.  IMPRESSION: 1. No acute intracranial abnormality. 2. Chiari I malformation.    CAROTID IMPRESSION: Color duplex indicates minimal heterogeneous plaque, with no hemodynamically significant stenosis by duplex criteria in the extracranial cerebrovascular circulation.    Echocardiography  1. The left ventricle has normal systolic function with an ejection fraction of 60-65%. The cavity size was normal. Left ventricular diastolic Doppler parameters are consistent with impaired relaxation.  2. The right ventricle has normal systolic function. The cavity was normal. There is no increase in right ventricular wall thickness.  3. No evidence of mitral valve stenosis.  4. The aortic valve is tricuspid. Aortic valve regurgitation is mild by color flow Doppler. No stenosis of the aortic valve.  5. The aorta is normal in size and  structure.  6. The aortic root is normal in size and structure.  THE BRAIN MRI IS REVIEWED IN PERSON.  No acute findings are noted.  There is no changes on DWI.  No hemorrhages appreciated.  There is mild global atrophy.  There is minimal matter changes noted FLAIR.  There is significant downward herniation of these cerebellum consistent with Chiari malformation.  The blood vessels appear to be normal on coronal and sagittal T2.  In particular the vertebrobasilar system is normal.    Robin Robin [name] A. Merlene Laughter, M.D.  Diplomate, Tax adviser of Psychiatry and Neurology ( Neurology). 09/07/2018, 3:01 PM

## 2018-09-07 NOTE — Progress Notes (Signed)
CODE STROKE TIMES 1503 CALL TIME  1512 CALL TIME AGAIN 1513 BEEPER TIME 1516 EXAM STARTED 1517 EXAM FINISHED 1517 IMAGES SENT TO SOC 1520 EXAM COMPLETED IN EPIC 1520 [facility] CALLED.

## 2018-09-07 NOTE — Progress Notes (Signed)
PROGRESS NOTE    Robin [name]  WYO:[id number] DOB: 04/12/1940 DOA: 09/06/2018 PCP: Terald Sleeper, PA-C   Brief Narrative:  Per HPI: Robin [name] is an 78 y.o. female with past medical history significant for hypertension and hyperlipidemia who was in her usual state of health until around 3:00 today when she woke up from a nap and noted she had difficulty walking to the door.  Patient states that she felt "out of my head, I knew where I was but I did not feel like I was in my body".  Patient states that "I could not get my legs to do what they needed to do".  She states that she finally made it to the door to open it, when her daughter saw her she stated, what is the matter with you mom" and called EMS.  Patient was seen in the ED where a code stroke was called and telemetry neurology recommended possible TPA, however patient declined the TPA.  At present patient states that she feels improved.  She notes that her head no longer feels abnormal.   She is having no difficulty with comprehension or with speech.  She has no apparent numbness or weakness on any particular side of her body.She has not yet tried to walk.   Patient denies any fevers or chills or any acute intercurrent illness.  Patient states that she was at baseline until she woke up and was unable to walk properly.  No cough.  No exposure to COVID.  7/30: CVA work-up pending. Patient denies any further symptomatology.  Assessment & Plan:   Principal Problem:   Stroke (cerebrum) ([place]) Active Problems:   Chronic kidney disease (CKD), stage III (moderate) (HCC)   GAD (generalized anxiety disorder)   Right facial droop with dysarthria and right hemiparesis suspect TIA versus CVA -Symptoms suggestive of LVO, but patient refused TPA -Brain MRI pending -2D echocardiogram and carotid ultrasound pending -Lipid panel with LDL 85, will start atorvastatin for now -A1c pending -Increase aspirin to full dose daily for now  -Continue IV fluids normal saline -SLP/PT/OT evaluation pending -Neurology consulted with evaluation pending  CKD stage III-stable -Continue to monitor repeat renal panel -Avoid nephrotoxins  GAD -Continue alprazolam as per home  GERD -Continue PPI   DVT prophylaxis: Lovenox Code Status: Full Family Communication: None currently at bedside Disposition Plan: Further CVA evaluation with brain MRI, echo, and carotid ultrasound.  Neurology consultation pending.   Consultants:   Neurology  Procedures:   None  Antimicrobials:   None   Subjective: Patient seen and evaluated today with no new acute complaints or concerns. No acute concerns or events noted overnight.  She denies any headache or other symptoms and appears to state that her weakness is improving.  Objective: Vitals:   09/07/18 0300 09/07/18 0400 09/07/18 0500 09/07/18 0600  BP: (!) 123/56 115/60 (!) 111/55 (!) 125/56  Pulse: (!) 47 (!) 50 (!) 41 (!) 49  Resp:      Temp:  97.7 F (36.5 C)    TempSrc:  Oral    SpO2: 99% 96% 99% 98%  Weight:        Intake/Output Summary (Last 24 hours) at 09/07/2018 0725 Last data filed at 09/07/2018 0600 Gross per 24 hour  Intake -  Output 150 ml  Net -150 ml   Filed Weights   09/06/18 1554  Weight: 52.3 kg    Examination:  General exam: Appears calm and comfortable  Respiratory system: Clear to auscultation.  Respiratory effort normal.  Currently on room air. Cardiovascular system: S1 & S2 heard, RRR. No JVD, murmurs, rubs, gallops or clicks. No pedal edema. Gastrointestinal system: Abdomen is nondistended, soft and nontender. No organomegaly or masses felt. Normal bowel sounds heard. Central nervous system: Alert and oriented.  Gross motor and sensory examination appears to be symmetric bilaterally. Extremities: No significant edema. Skin: No rashes, lesions or ulcers Psychiatry: Judgement and insight appear normal. Mood & affect appropriate.     Data  Reviewed: I have personally reviewed following labs and imaging studies  CBC: Recent Labs  Lab 09/06/18 1532  WBC 6.6  NEUTROABS 4.5  HGB 9.9*  HCT 31.7*  MCV 93.5  PLT 242   Basic Metabolic Panel: Recent Labs  Lab 09/06/18 1532  NA 140  K 3.4*  CL 107  CO2 23  GLUCOSE 92  BUN 14  CREATININE 1.23*  CALCIUM 9.3   GFR: Estimated Creatinine Clearance: 27.5 mL/min (A) (by C-G formula based on SCr of 1.23 mg/dL (H)). Liver Function Tests: Recent Labs  Lab 09/06/18 1532  AST 14*  ALT 9  ALKPHOS 97  BILITOT 0.7  PROT 6.6  ALBUMIN 3.8   No results for input(s): LIPASE, AMYLASE in the last 168 hours. No results for input(s): AMMONIA in the last 168 hours. Coagulation Profile: Recent Labs  Lab 09/06/18 1532  INR 0.9   Cardiac Enzymes: No results for input(s): CKTOTAL, CKMB, CKMBINDEX, TROPONINI in the last 168 hours. BNP (last 3 results) No results for input(s): PROBNP in the last 8760 hours. HbA1C: No results for input(s): HGBA1C in the last 72 hours. CBG: Recent Labs  Lab 09/06/18 1603  GLUCAP 83   Lipid Profile: Recent Labs    09/07/18 0432  CHOL 168  HDL 72  LDLCALC 85  TRIG 56  CHOLHDL 2.3   Thyroid Function Tests: No results for input(s): TSH, T4TOTAL, FREET4, T3FREE, THYROIDAB in the last 72 hours. Anemia Panel: No results for input(s): VITAMINB12, FOLATE, FERRITIN, TIBC, IRON, RETICCTPCT in the last 72 hours. Sepsis Labs: No results for input(s): PROCALCITON, LATICACIDVEN in the last 168 hours.  Recent Results (from the past 240 hour(s))  SARS Coronavirus 2 (CEPHEID - Performed in [place] hospital lab), Hosp Order     Status: None   Collection Time: 09/06/18  3:27 PM   Specimen: Nasopharyngeal Swab  Result Value Ref Range Status   SARS Coronavirus 2 NEGATIVE NEGATIVE Final    Comment: (NOTE) If result is NEGATIVE SARS-CoV-2 target nucleic acids are NOT DETECTED. The SARS-CoV-2 RNA is generally detectable in upper and lower   respiratory specimens during the acute phase of infection. The lowest  concentration of SARS-CoV-2 viral copies this assay can detect is 250  copies / mL. A negative result does not preclude SARS-CoV-2 infection  and should not be used as the sole basis for treatment or other  patient management decisions.  A negative result may occur with  improper specimen collection / handling, submission of specimen other  than nasopharyngeal swab, presence of viral mutation(s) within the  areas targeted by this assay, and inadequate number of viral copies  (<250 copies / mL). A negative result must be combined with clinical  observations, patient history, and epidemiological information. If result is POSITIVE SARS-CoV-2 target nucleic acids are DETECTED. The SARS-CoV-2 RNA is generally detectable in upper and lower  respiratory specimens dur ing the acute phase of infection.  Positive  results are indicative of active infection with SARS-CoV-2.  Clinical  correlation with patient history and other diagnostic information is  necessary to determine patient infection status.  Positive results do  not rule out bacterial infection or co-infection with other viruses. If result is PRESUMPTIVE POSTIVE SARS-CoV-2 nucleic acids MAY BE PRESENT.   A presumptive positive result was obtained on the submitted specimen  and confirmed on repeat testing.  While 2019 novel coronavirus  (SARS-CoV-2) nucleic acids may be present in the submitted sample  additional confirmatory testing may be necessary for epidemiological  and / or clinical management purposes  to differentiate between  SARS-CoV-2 and other Sarbecovirus currently known to infect humans.  If clinically indicated additional testing with an alternate test  methodology [phone]) is advised. The SARS-CoV-2 RNA is generally  detectable in upper and lower respiratory sp ecimens during the acute  phase of infection. The expected result is Negative. Fact  Sheet for Patients:  StrictlyIdeas.no Fact Sheet for Healthcare Providers: BankingDealers.co.za This test is not yet approved or cleared by the [place] FDA and has been authorized for detection and/or diagnosis of SARS-CoV-2 by FDA under an Emergency Use Authorization (EUA).  This EUA will remain in effect (meaning this test can be used) for the duration of the COVID-19 declaration under Section 564(b)(1) of the Act, 21 U.S.C. section 360bbb-3(b)(1), unless the authorization is terminated or revoked sooner. Performed at [facility], [address], [place], [place] [address]   MRSA PCR Screening     Status: None   Collection Time: 09/06/18 11:56 PM   Specimen: Nasopharyngeal  Result Value Ref Range Status   MRSA by PCR NEGATIVE NEGATIVE Final    Comment:        The GeneXpert MRSA Assay (FDA approved for NASAL specimens only), is one component of a comprehensive MRSA colonization surveillance program. It is not intended to diagnose MRSA infection nor to guide or monitor treatment for MRSA infections. Performed at [facility], [address] [place], [place] [address]          Radiology Studies: Ct Head Code Stroke Wo Contrast  Result Date: 09/06/2018 CLINICAL DATA:  Code stroke. Facial droop. Right-sided weakness. Slurred speech. Symptoms began 30 minutes ago. EXAM: CT HEAD WITHOUT CONTRAST TECHNIQUE: Contiguous axial images were obtained from the base of the skull through the vertex without intravenous contrast. COMPARISON:  08/04/2014 FINDINGS: Brain: No sign of acute infarction, mass lesion, hemorrhage, hydrocephalus or extra-axial collection. Mild chronic small-vessel change of the hemispheric Holton matter. Vascular: There is atherosclerotic calcification of the major vessels at the base of the brain. Skull: Negative Sinuses/Orbits: Clear/normal Other: None ASPECTS ([place] Stroke Program Early CT Score) - Ganglionic level  infarction (caudate, lentiform nuclei, internal capsule, insula, M1-M3 cortex): 7 - Supraganglionic infarction (M4-M6 cortex): 3 Total score (0-10 with 10 being normal): 10 IMPRESSION: 1. No acute finding. Chronic small-vessel ischemic change of the Adolf matter. 2. ASPECTS is 10 3. These results were called by telephone at the time of interpretation on 09/06/2018 at 3:28 pm to Dr. Virgel Manifold , who verbally acknowledged these results. Electronically Signed   By: Nelson Chimes M.D.   On: 09/06/2018 15:30        Scheduled Meds: .  stroke: mapping our early stages of recovery book   Does not apply Once  . ALPRAZolam  0.25 mg Oral QHS  . aspirin  324 mg Oral Daily  . atorvastatin  40 mg Oral q1800  . docusate sodium  100 mg Oral Daily  . enoxaparin (LOVENOX) injection  30  mg Subcutaneous Q24H  . pantoprazole  20 mg Oral Daily   Continuous Infusions: . sodium chloride       LOS: 0 days    Time spent: 30 minutes    Kathrin Folden Darleen Crocker, DO [facility] Pager [phone]  If 7PM-7AM, please contact night-coverage [email or web address] Password [email or web address] 09/07/2018, 7:25 AM

## 2018-09-07 NOTE — Evaluation (Signed)
Physical Therapy Evaluation Patient Details Name: Derotha Fishbaugh MRN: [id number] DOB: February 17, 1940 Today's Date: 09/07/2018   History of Present Illness  Katrisha Segall is an 78 y.o. female with past medical history significant for hypertension and hyperlipidemia who was in her usual state of health until around 3:00 today when she woke up from a nap and noted she had difficulty walking to the door.  Patient states that she felt "out of my head, I knew where I was but I did not feel like I was in my body".  Patient states that "I could not get my legs to do what they needed to do".  She states that she finally made it to the door to open it, when her daughter saw her she stated, what is the matter with you mom" and called EMS.  Patient was seen in the ED where a code stroke was called and telemetry neurology recommended possible TPA, however patient declined the TPA.    Clinical Impression  Pt is a pleasant 78 YO female with stroke work-up pending. Pt able to complete bed mobility, transfers with RW and ambulation with RW in hallways and navigating hospital room with good balance. Pt with equal strength bil and no numbness/tingling. Physical therapy evaluation completed, patient is at baseline and no further PT services recommended at this time. Patient discharged to care of nursing for ambulation daily as tolerated for length of stay.     Follow Up Recommendations No PT follow up    Equipment Recommendations       Recommendations for Other Services       Precautions / Restrictions Precautions Precautions: None Restrictions Weight Bearing Restrictions: No      Mobility  Bed Mobility Overal bed mobility: Modified Independent             General bed mobility comments: increased time  Transfers Overall transfer level: Modified independent Equipment used: Rolling walker (2 wheeled)             General transfer comment: occasional bracing of BLE on front of chair with  rising to stand  Ambulation/Gait Ambulation/Gait assistance: Modified independent (Device/Increase time) Gait Distance (Feet): 150 Feet Assistive device: Rolling walker (2 wheeled) Gait Pattern/deviations: WFL(Within Functional Limits);Step-through pattern;Decreased stride length Gait velocity: decreased   General Gait Details: slow cadence, body positioned within RW frame, equal bil step length and foot clearance, no foot drop  Stairs            Wheelchair Mobility    Modified Rankin (Stroke Patients Only)       Balance Overall balance assessment: Modified Independent(Good with RW for functional activity)                                           Pertinent Vitals/Pain Pain Assessment: No/denies pain    Home Living Family/patient expects to be discharged to:: Private residence Living Arrangements: Children Available Help at Discharge: Family Type of Home: House Home Access: Ramped entrance     Home Layout: Two level;Able to live on main level with bedroom/bathroom Home Equipment: Gilford Rile - 2 wheels;Cane - single point;Bedside commode;Shower seat;Grab bars - tub/shower      Prior Function Level of Independence: Independent with assistive device(s);Needs assistance   Gait / Transfers Assistance Needed: Pt reports Ind with ambulation using RW and getting in/out of chairs and bed. Pt reports increase in R  hip soreness requiring occasional assist from daughter to lift BLE into bed.  ADL's / Homemaking Assistance Needed: Pt reports Ind with dressing, fixing simple meal, driving. Pt reports daughters and granddaughter assist with getting pt into shower and cooking bigger meals.        Hand Dominance   Dominant Hand: Right    Extremity/Trunk Assessment   Upper Extremity Assessment Upper Extremity Assessment: Defer to OT evaluation    Lower Extremity Assessment Lower Extremity Assessment: Overall WFL for tasks assessed(Denies  numbness/tingling)    Cervical / Trunk Assessment Cervical / Trunk Assessment: Normal  Communication   Communication: No difficulties  Cognition Arousal/Alertness: Awake/alert Behavior During Therapy: WFL for tasks assessed/performed Overall Cognitive Status: Within Functional Limits for tasks assessed                                        General Comments      Exercises     Assessment/Plan    PT Assessment Patent does not need any further PT services  PT Problem List         PT Treatment Interventions      PT Goals (Current goals can be found in the Care Plan section)  Acute Rehab PT Goals Patient Stated Goal: return home PT Goal Formulation: With patient Time For Goal Achievement: 09/07/18 Potential to Achieve Goals: Good    Frequency     Barriers to discharge        Co-evaluation PT/OT/SLP Co-Evaluation/Treatment: Yes Reason for Co-Treatment: To address functional/ADL transfers           AM-PAC PT "6 Clicks" Mobility  Outcome Measure Help needed turning from your back to your side while in a flat bed without using bedrails?: None Help needed moving from lying on your back to sitting on the side of a flat bed without using bedrails?: None Help needed moving to and from a bed to a chair (including a wheelchair)?: None Help needed standing up from a chair using your arms (e.g., wheelchair or bedside chair)?: None Help needed to walk in hospital room?: None Help needed climbing 3-5 steps with a railing? : None 6 Click Score: 24    End of Session Equipment Utilized During Treatment: Gait belt Activity Tolerance: Patient tolerated treatment well Patient left: in chair;with call bell/phone within reach Nurse Communication: Mobility status PT Visit Diagnosis: Other abnormalities of gait and mobility (R26.89)    Time: 0720-0740 PT Time Calculation (min) (ACUTE ONLY): 20 min   Charges:   PT Evaluation $PT Eval Low Complexity: 1 Low           Tori Jeovanny Cuadros PT, DPT 09/07/18, 7:56 AM [phone]
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
## 2018-09-07 NOTE — Evaluation (Signed)
Occupational Therapy Evaluation Patient Details Name: Robin [name] MRN: [id number] DOB: 12/09/1940 Today's Date: 09/07/2018    History of Present Illness Robin [name] is an 78 y.o. female with past medical history significant for hypertension and hyperlipidemia who was in her usual state of health until around 3:00 today when she woke up from a nap and noted she had difficulty walking to the door.  Patient states that she felt "out of my head, I knew where I was but I did not feel like I was in my body".  Patient states that "I could not get my legs to do what they needed to do".  She states that she finally made it to the door to open it, when her daughter saw her she stated, what is the matter with you mom" and called EMS.  Patient was seen in the ED where a code stroke was called and telemetry neurology recommended possible TPA, however patient declined the TPA.   Clinical Impression   Pt received in bed, agreeable to evaluation this am. Pt reports symptoms have improved and is demonstrating BUE strength WFL, coordination and sensation are intact. Pt performing ADLs with supervision and RW, no LOB. Pt is at baseline with functional tasks, no further OT services required at this time.     Follow Up Recommendations  No OT follow up    Equipment Recommendations  None recommended by OT       Precautions / Restrictions Precautions Precautions: None Restrictions Weight Bearing Restrictions: No      Mobility Bed Mobility Overal bed mobility: Modified Independent             General bed mobility comments: increased time  Transfers Overall transfer level: Modified independent Equipment used: Rolling walker (2 wheeled)             General transfer comment: occasional bracing of BLE on front of chair with rising to stand        ADL either performed or assessed with clinical judgement   ADL Overall ADL's : Modified independent;At baseline                      Lower Body Dressing: Modified independent;Sitting/lateral leans   Toilet Transfer: Supervision/safety;RW   Toileting- Clothing Manipulation and Hygiene: Modified independent;Sitting/lateral lean;Sit to/from stand       Functional mobility during ADLs: Supervision/safety;Rolling walker       Vision Baseline Vision/History: No visual deficits Patient Visual Report: No change from baseline Vision Assessment?: No apparent visual deficits            Pertinent Vitals/Pain Pain Assessment: No/denies pain     Hand Dominance Right   Extremity/Trunk Assessment Upper Extremity Assessment Upper Extremity Assessment: Overall WFL for tasks assessed(BUE 4/5)   Lower Extremity Assessment Lower Extremity Assessment: Defer to PT evaluation   Cervical / Trunk Assessment Cervical / Trunk Assessment: Normal   Communication Communication Communication: No difficulties   Cognition Arousal/Alertness: Awake/alert Behavior During Therapy: WFL for tasks assessed/performed Overall Cognitive Status: Within Functional Limits for tasks assessed                                                Home Living Family/patient expects to be discharged to:: Private residence Living Arrangements: Children Available Help at Discharge: Family Type of Home: House Home Access: Ramped entrance  Home Layout: Two level;Able to live on main level with bedroom/bathroom Alternate Level Stairs-Number of Steps: Flight   Bathroom Shower/Tub: Teacher, early years/pre: Standard     Home Equipment: Environmental consultant - 2 wheels;Cane - single point;Bedside commode;Shower seat;Grab bars - tub/shower          Prior Functioning/Environment Level of Independence: Independent with assistive device(s);Needs assistance  Gait / Transfers Assistance Needed: Pt reports Ind with ambulation using RW and getting in/out of chairs and bed. Pt reports increase in R hip soreness requiring occasional  assist from daughter to lift BLE into bed. ADL's / Homemaking Assistance Needed: Pt reports Ind with dressing, fixing simple meal, driving. Pt reports daughters and granddaughter assist with getting pt into shower and cooking bigger meals.            OT Problem List: Decreased strength;Decreased activity tolerance      OT Treatment/Interventions:      OT Goals(Current goals can be found in the care plan section) Acute Rehab OT Goals Patient Stated Goal: return home             Co-evaluation PT/OT/SLP Co-Evaluation/Treatment: Yes Reason for Co-Treatment: Complexity of the patient's impairments (multi-system involvement);For patient/therapist safety;To address functional/ADL transfers   OT goals addressed during session: ADL's and self-care;Proper use of Adaptive equipment and DME      AM-PAC OT "6 Clicks" Daily Activity     Outcome Measure Help from another person eating meals?: None Help from another person taking care of personal grooming?: None Help from another person toileting, which includes using toliet, bedpan, or urinal?: None Help from another person bathing (including washing, rinsing, drying)?: A Little Help from another person to put on and taking off regular upper body clothing?: None Help from another person to put on and taking off regular lower body clothing?: None 6 Click Score: 23   End of Session Equipment Utilized During Treatment: Gait belt;Rolling walker Nurse Communication: Mobility status;Other (comment)(purewick removed)  Activity Tolerance: Patient tolerated treatment well Patient left: in chair;with call bell/phone within reach  OT Visit Diagnosis: Muscle weakness (generalized) (M62.81)                Time: 1102-1117 OT Time Calculation (min): 17 min Charges:  OT General Charges $OT Visit: 1 Visit OT Evaluation $OT Eval Low Complexity: [place], OTR/L  [phone] 09/07/2018, 8:05 AM

## 2018-09-08 ENCOUNTER — Observation Stay (HOSPITAL_COMMUNITY): Payer: Medicare Other

## 2018-09-08 DIAGNOSIS — G9389 Other specified disorders of brain: Secondary | ICD-10-CM | POA: Diagnosis not present

## 2018-09-08 DIAGNOSIS — I639 Cerebral infarction, unspecified: Secondary | ICD-10-CM | POA: Diagnosis not present

## 2018-09-08 LAB — BASIC METABOLIC PANEL
Anion gap: 7 (ref 5–15)
BUN: 14 mg/dL (ref 8–23)
CO2: 26 mmol/L (ref 22–32)
Calcium: 8.8 mg/dL — ABNORMAL LOW (ref 8.9–10.3)
Chloride: 108 mmol/L (ref 98–111)
Creatinine, Ser: 1.37 mg/dL — ABNORMAL HIGH (ref 0.44–1.00)
GFR calc Af Amer: 43 mL/min — ABNORMAL LOW (ref >60)
GFR calc non Af Amer: 37 mL/min — ABNORMAL LOW (ref >60)
Glucose, Bld: 100 mg/dL — ABNORMAL HIGH (ref 70–99)
Potassium: 4.1 mmol/L (ref 3.5–5.1)
Sodium: 141 mmol/L (ref 135–145)

## 2018-09-08 LAB — CBC
HCT: 33.1 % — ABNORMAL LOW (ref 36.0–46.0)
Hemoglobin: 9.9 g/dL — ABNORMAL LOW (ref 12.0–15.0)
MCH: 28.4 pg (ref 26.0–34.0)
MCHC: 29.9 g/dL — ABNORMAL LOW (ref 30.0–36.0)
MCV: 95.1 fL (ref 80.0–100.0)
Platelets: 283 10*3/uL (ref 150–400)
RBC: 3.48 MIL/uL — ABNORMAL LOW (ref 3.87–5.11)
RDW: 15 % (ref 11.5–15.5)
WBC: 8.9 10*3/uL (ref 4.0–10.5)
nRBC: 0 % (ref 0.0–0.2)

## 2018-09-08 LAB — MAGNESIUM: Magnesium: 1.8 mg/dL (ref 1.7–2.4)

## 2018-09-08 MED ORDER — ATORVASTATIN CALCIUM 10 MG PO TABS: 10.0000 mg | ORAL_TABLET | Freq: Every day | ORAL | 3 refills | Status: DC

## 2018-09-08 MED ORDER — ASPIRIN EC 325 MG PO TBEC: 325.0000 mg | DELAYED_RELEASE_TABLET | Freq: Every day | ORAL | 3 refills | Status: DC

## 2018-09-08 MED ORDER — CLOPIDOGREL BISULFATE 75 MG PO TABS: 75.0000 mg | ORAL_TABLET | Freq: Every day | ORAL | 0 refills | Status: AC

## 2018-09-08 NOTE — Progress Notes (Signed)
Discharge instructions given to pt. Pt expressed understanding of discharge instructions. Oldest daughter, to pick up pt at 21 a.m.

## 2018-09-08 NOTE — Discharge Summary (Signed)
Physician Discharge Summary  Robin [name] WGN:[id number] DOB: January 01, 1941 DOA: 09/06/2018  PCP: Terald Sleeper, PA-C  Admit date: 09/06/2018  Discharge date: 09/08/2018  Admitted From:Home  Disposition:  Home  Recommendations for Outpatient Follow-up:  1. Follow up with PCP in 1-2 weeks 2. Follow-up with Dr. Merlene Laughter in 4 weeks and consider brain MRA at that time 3. Continue on aspirin and Plavix as recommended for 1 month and then subsequently aspirin 325 mg daily thereafter 4. Continue on statin as prescribed with LDL noted to be 85 at this visit 5. Hemoglobin A1c 5.2%  Home Health: None  Equipment/Devices: None  Discharge Condition: Stable  CODE STATUS: Full  Diet recommendation: Heart Healthy  Brief/Interim Summary: Per HPI: Robin [name] an 78 y.o.femalewith past medical history significant for hypertension and hyperlipidemia who was in her usual state of health until around 3:00 today when she woke up from a nap and noted she had difficulty walking to the door. Patient states that she felt "out of my head,I knew where I was but I did not feel like I was in my body". Patient states that "I could not get my legs to do what they needed to do". She states that she finally made it to the door to open it, when her daughter saw her she stated, what is the matter with you mom"and called EMS. Patient was seen in the ED where a code stroke was called and telemetry neurology recommended possible TPA,however patient declined the TPA.  At present patient states that she feels improved. She notes that her head no longer feels abnormal. She is having no difficulty with comprehension or with speech. She has no apparent numbness or weakness on any particular side of her body.She has not yet tried to walk.   Patient denies any fevers or chills or any acute intercurrent illness. Patient states that she was at baseline until she woke up and was unable to walk properly.  No cough. No exposure to COVID.  Patient is undergone full CVA work-up and brain MRI notably negative for any acute abnormalities and it appears that she has had a TIA.  Minimal plaque noted on bilateral carotid ultrasounds and 2D echocardiogram with no thrombus identified and LVEF 60-65%.  She has been seen by neurology with recommendations to stay on dual antiplatelet therapy for 1 month with aspirin and Plavix and subsequently on full dose aspirin thereafter.  She will also be started on statin as prescribed given LDL of 85 at this visit.  She has been seen by PT and OT with no recommendations and has recovered fully with no deficits identified.  She is stable for discharge home and has had no other acute events during the course of this admission.  Neurology has recommended also subsequent brain MRA which can be pursued in the outpatient setting with further follow-up in the next 4 to 6 weeks.  Discharge Diagnoses:  Principal Problem:   Stroke (cerebrum) ([facility]) Active Problems:   Chronic kidney disease (CKD), stage III (moderate) ([facility])   GAD (generalized anxiety disorder)  Principal discharge diagnosis: TIA.  Discharge Instructions  Discharge Instructions    Diet - low sodium heart healthy   Complete by: As directed    Increase activity slowly   Complete by: As directed      Allergies as of 09/08/2018      Reactions   Dilaudid [hydromorphone Hcl] Nausea And Vomiting   Lisinopril Cough   Valium [diazepam] Other (See Comments)  Family states patient has adverse reaction and displays confusion.       Medication List    TAKE these medications   albuterol 108 (90 Base) MCG/ACT inhaler Commonly known as: VENTOLIN HFA Inhale 2 puffs into the lungs every 6 (six) hours as needed for wheezing or shortness of breath.   ALPRAZolam 0.5 MG tablet Commonly known as: XANAX Take 0.5 tablets (0.25 mg total) by mouth at bedtime as needed for anxiety. What changed:   how much to  take  when to take this   aspirin 81 MG tablet Take 81 mg by mouth at bedtime. What changed: Another medication with the same name was added. Make sure you understand how and when to take each.   aspirin EC 325 MG tablet Take 1 tablet (325 mg total) by mouth daily. Start taking on: October 10, 2018 What changed: You were already taking a medication with the same name, and this prescription was added. Make sure you understand how and when to take each.   atorvastatin 10 MG tablet Commonly known as: LIPITOR Take 1 tablet (10 mg total) by mouth daily at 6 PM.   clopidogrel 75 MG tablet Commonly known as: PLAVIX Take 1 tablet (75 mg total) by mouth daily with breakfast.   docusate sodium 100 MG capsule Commonly known as: COLACE Take 100 mg by mouth daily.   HYDROcodone-acetaminophen 10-325 MG tablet Commonly known as: NORCO Take 1 tablet by mouth every 8 (eight) hours as needed. What changed: reasons to take this   HYDROcodone-acetaminophen 10-325 MG tablet Commonly known as: NORCO Take 1 tablet by mouth every 8 (eight) hours as needed for moderate pain. What changed: Another medication with the same name was changed. Make sure you understand how and when to take each.   meclizine 25 MG tablet Commonly known as: ANTIVERT Take 1 tablet (25 mg total) by mouth 3 (three) times daily as needed for dizziness.   pantoprazole 20 MG tablet Commonly known as: PROTONIX Take 1 tablet (20 mg total) by mouth daily.   Vitamin D 125 MCG (5000 UT) Caps Take 5,000 Units by mouth every morning.      Follow-up Information    Terald Sleeper, PA-C Follow up in 2 week(s).   Specialties: Physician Assistant, Family Medicine Contact information: [place] [address] [phone]        Phillips Odor, MD Follow up in 4 week(s).   Specialty: Neurology Contact information: [address] [place] [address] [phone]          Allergies  Allergen  Reactions  . Dilaudid [Hydromorphone Hcl] Nausea And Vomiting  . Lisinopril Cough  . Valium [Diazepam] Other (See Comments)    Family states patient has adverse reaction and displays confusion.     Consultations:  Neurology   Procedures/Studies: Mr Brain Wo Contrast  Result Date: 09/07/2018 CLINICAL DATA:  Right-sided weakness for 2 days. EXAM: MRI HEAD WITHOUT CONTRAST TECHNIQUE: Multiplanar, multiecho pulse sequences of the brain and surrounding structures were obtained without intravenous contrast. COMPARISON:  Head CT 09/06/2018 FINDINGS: Brain: The cerebellar tonsils extend 1.6 cm below the foramen magnum with a pointed appearance. There is no evidence of acute infarct, intracranial hemorrhage, mass, midline shift, or extra-axial fluid collection. There is mild cerebral atrophy. Minimal periventricular Waldvogel matter T2 hyperintensities are not considered abnormal for age. Vascular: Major intracranial vascular flow voids are preserved. Skull and upper cervical spine: Unremarkable bone marrow signal. Sinuses/Orbits: Bilateral cataract extraction. Trace left mastoid  effusion. Clear paranasal sinuses. Other: None. IMPRESSION: 1. No acute intracranial abnormality. 2. Chiari I malformation. Electronically Signed   By: Logan Bores M.D.   On: 09/07/2018 09:16   US Carotid Bilateral (at [facility] And [facility] Only)  Result Date: 09/07/2018 CLINICAL DATA:  78 year old female with a history of gait instability EXAM: BILATERAL CAROTID DUPLEX ULTRASOUND TECHNIQUE: Pearline Cables scale imaging, color Doppler and duplex ultrasound were performed of bilateral carotid and vertebral arteries in the neck. COMPARISON:  None. FINDINGS: Criteria: Quantification of carotid stenosis is based on velocity parameters that correlate the residual internal carotid diameter with NASCET-based stenosis levels, using the diameter of the distal internal carotid lumen as the denominator for stenosis measurement. The following velocity measurements  were obtained: RIGHT ICA:  Systolic 967 cm/sec, Diastolic 30 cm/sec CCA:  64 cm/sec SYSTOLIC ICA/CCA RATIO:  1.5 ECA:  61 cm/sec LEFT ICA:  Systolic 92 cm/sec, Diastolic 27 cm/sec CCA:  77 cm/sec SYSTOLIC ICA/CCA RATIO:  1.2 ECA:  52 cm/sec Right Brachial SBP: Not acquired Left Brachial SBP: Not acquired RIGHT CAROTID ARTERY: No significant calcified disease of the right common carotid artery. Intermediate waveform maintained. Heterogeneous plaque without significant calcifications at the right carotid bifurcation. Low resistance waveform of the right ICA. Moderate tortuosity . RIGHT VERTEBRAL ARTERY: Antegrade flow with low resistance waveform. LEFT CAROTID ARTERY: No significant calcified disease of the left common carotid artery. Intermediate waveform maintained. Heterogeneous plaque at the left carotid bifurcation without significant calcifications. Low resistance waveform of the left ICA. Moderate tortuosity LEFT VERTEBRAL ARTERY:  Antegrade flow with low resistance waveform. IMPRESSION: Color duplex indicates minimal heterogeneous plaque, with no hemodynamically significant stenosis by duplex criteria in the extracranial cerebrovascular circulation. Signed, Dulcy Fanny. Dellia Nims, RPVI [facility] Electronically Signed   By: Corrie Mckusick D.O.   On: 09/07/2018 10:59   Ct Head Code Stroke Wo Contrast  Result Date: 09/06/2018 CLINICAL DATA:  Code stroke. Facial droop. Right-sided weakness. Slurred speech. Symptoms began 30 minutes ago. EXAM: CT HEAD WITHOUT CONTRAST TECHNIQUE: Contiguous axial images were obtained from the base of the skull through the vertex without intravenous contrast. COMPARISON:  08/04/2014 FINDINGS: Brain: No sign of acute infarction, mass lesion, hemorrhage, hydrocephalus or extra-axial collection. Mild chronic small-vessel change of the hemispheric Tischer matter. Vascular: There is atherosclerotic calcification of the major  vessels at the base of the brain. Skull: Negative Sinuses/Orbits: Clear/normal Other: None ASPECTS ([place] Stroke Program Early CT Score) - Ganglionic level infarction (caudate, lentiform nuclei, internal capsule, insula, M1-M3 cortex): 7 - Supraganglionic infarction (M4-M6 cortex): 3 Total score (0-10 with 10 being normal): 10 IMPRESSION: 1. No acute finding. Chronic small-vessel ischemic change of the Carberry matter. 2. ASPECTS is 10 3. These results were called by telephone at the time of interpretation on 09/06/2018 at 3:28 pm to Dr. Virgel Manifold , who verbally acknowledged these results. Electronically Signed   By: Nelson Chimes M.D.   On: 09/06/2018 15:30     Discharge Exam: Vitals:   09/08/18 0300 09/08/18 0400  BP: (!) 111/58 (!) 113/57  Pulse:    Resp: 14 14  Temp:    SpO2:     Vitals:   09/08/18 0100 09/08/18 0200 09/08/18 0300 09/08/18 0400  BP: (!) 102/55 (!) 121/57 (!) 111/58 (!) 113/57  Pulse:      Resp: 14 14 14 14   Temp:      TempSrc:      SpO2:      Weight:  General: Pt is alert, awake, not in acute distress Cardiovascular: RRR, S1/S2 +, no rubs, no gallops Respiratory: CTA bilaterally, no wheezing, no rhonchi Abdominal: Soft, NT, ND, bowel sounds + Extremities: no edema, no cyanosis    The results of significant diagnostics from this hospitalization (including imaging, microbiology, ancillary and laboratory) are listed below for reference.     Microbiology: Recent Results (from the past 240 hour(s))  SARS Coronavirus 2 (CEPHEID - Performed in [place] hospital lab), Hosp Order     Status: None   Collection Time: 09/06/18  3:27 PM   Specimen: Nasopharyngeal Swab  Result Value Ref Range Status   SARS Coronavirus 2 NEGATIVE NEGATIVE Final    Comment: (NOTE) If result is NEGATIVE SARS-CoV-2 target nucleic acids are NOT DETECTED. The SARS-CoV-2 RNA is generally detectable in upper and lower  respiratory specimens during the acute phase of infection.  The lowest  concentration of SARS-CoV-2 viral copies this assay can detect is 250  copies / mL. A negative result does not preclude SARS-CoV-2 infection  and should not be used as the sole basis for treatment or other  patient management decisions.  A negative result may occur with  improper specimen collection / handling, submission of specimen other  than nasopharyngeal swab, presence of viral mutation(s) within the  areas targeted by this assay, and inadequate number of viral copies  (<250 copies / mL). A negative result must be combined with clinical  observations, patient history, and epidemiological information. If result is POSITIVE SARS-CoV-2 target nucleic acids are DETECTED. The SARS-CoV-2 RNA is generally detectable in upper and lower  respiratory specimens dur ing the acute phase of infection.  Positive  results are indicative of active infection with SARS-CoV-2.  Clinical  correlation with patient history and other diagnostic information is  necessary to determine patient infection status.  Positive results do  not rule out bacterial infection or co-infection with other viruses. If result is PRESUMPTIVE POSTIVE SARS-CoV-2 nucleic acids MAY BE PRESENT.   A presumptive positive result was obtained on the submitted specimen  and confirmed on repeat testing.  While 2019 novel coronavirus  (SARS-CoV-2) nucleic acids may be present in the submitted sample  additional confirmatory testing may be necessary for epidemiological  and / or clinical management purposes  to differentiate between  SARS-CoV-2 and other Sarbecovirus currently known to infect humans.  If clinically indicated additional testing with an alternate test  methodology [phone]) is advised. The SARS-CoV-2 RNA is generally  detectable in upper and lower respiratory sp ecimens during the acute  phase of infection. The expected result is Negative. Fact Sheet for Patients:   StrictlyIdeas.no Fact Sheet for Healthcare Providers: BankingDealers.co.za This test is not yet approved or cleared by the [place] FDA and has been authorized for detection and/or diagnosis of SARS-CoV-2 by FDA under an Emergency Use Authorization (EUA).  This EUA will remain in effect (meaning this test can be used) for the duration of the COVID-19 declaration under Section 564(b)(1) of the Act, 21 U.S.C. section 360bbb-3(b)(1), unless the authorization is terminated or revoked sooner. Performed at [facility], [address], [place], [place] [address]   MRSA PCR Screening     Status: None   Collection Time: 09/06/18 11:56 PM   Specimen: Nasopharyngeal  Result Value Ref Range Status   MRSA by PCR NEGATIVE NEGATIVE Final    Comment:        The GeneXpert MRSA Assay (FDA approved for NASAL specimens only), is one component  of a comprehensive MRSA colonization surveillance program. It is not intended to diagnose MRSA infection nor to guide or monitor treatment for MRSA infections. Performed at [facility], [address], [place], [place] [address]      Labs: BNP (last 3 results) Recent Labs    01/01/18 0739  BNP 77.8   Basic Metabolic Panel: Recent Labs  Lab 09/06/18 1532 09/08/18 0425  NA 140 141  K 3.4* 4.1  CL 107 108  CO2 23 26  GLUCOSE 92 100*  BUN 14 14  CREATININE 1.23* 1.37*  CALCIUM 9.3 8.8*  MG  --  1.8   Liver Function Tests: Recent Labs  Lab 09/06/18 1532  AST 14*  ALT 9  ALKPHOS 97  BILITOT 0.7  PROT 6.6  ALBUMIN 3.8   No results for input(s): LIPASE, AMYLASE in the last 168 hours. No results for input(s): AMMONIA in the last 168 hours. CBC: Recent Labs  Lab 09/06/18 1532 09/08/18 0425  WBC 6.6 8.9  NEUTROABS 4.5  --   HGB 9.9* 9.9*  HCT 31.7* 33.1*  MCV 93.5 95.1  PLT 283 283   Cardiac Enzymes: No results for input(s): CKTOTAL, CKMB, CKMBINDEX, TROPONINI in the last 168  hours. BNP: Invalid input(s): POCBNP CBG: Recent Labs  Lab 09/06/18 1603  GLUCAP 83   D-Dimer No results for input(s): DDIMER in the last 72 hours. Hgb A1c Recent Labs    09/07/18 0432  HGBA1C 5.2   Lipid Profile Recent Labs    09/07/18 0432  CHOL 168  HDL 72  LDLCALC 85  TRIG 56  CHOLHDL 2.3   Thyroid function studies No results for input(s): TSH, T4TOTAL, T3FREE, THYROIDAB in the last 72 hours.  Invalid input(s): FREET3 Anemia work up No results for input(s): VITAMINB12, FOLATE, FERRITIN, TIBC, IRON, RETICCTPCT in the last 72 hours. Urinalysis    Component Value Date/Time   COLORURINE STRAW (A) 09/06/2018 1520   APPEARANCEUR CLEAR 09/06/2018 1520   APPEARANCEUR Clear 04/21/2018 0918   LABSPEC 1.003 (L) 09/06/2018 1520   PHURINE 6.0 09/06/2018 1520   GLUCOSEU NEGATIVE 09/06/2018 1520   HGBUR NEGATIVE 09/06/2018 1520   BILIRUBINUR NEGATIVE 09/06/2018 1520   BILIRUBINUR Negative 04/21/2018 0918   KETONESUR NEGATIVE 09/06/2018 1520   PROTEINUR NEGATIVE 09/06/2018 1520   UROBILINOGEN 1.0 08/04/2014 1012   NITRITE NEGATIVE 09/06/2018 1520   LEUKOCYTESUR NEGATIVE 09/06/2018 1520   Sepsis Labs Invalid input(s): PROCALCITONIN,  WBC,  LACTICIDVEN Microbiology Recent Results (from the past 240 hour(s))  SARS Coronavirus 2 (CEPHEID - Performed in [place] hospital lab), Hosp Order     Status: None   Collection Time: 09/06/18  3:27 PM   Specimen: Nasopharyngeal Swab  Result Value Ref Range Status   SARS Coronavirus 2 NEGATIVE NEGATIVE Final    Comment: (NOTE) If result is NEGATIVE SARS-CoV-2 target nucleic acids are NOT DETECTED. The SARS-CoV-2 RNA is generally detectable in upper and lower  respiratory specimens during the acute phase of infection. The lowest  concentration of SARS-CoV-2 viral copies this assay can detect is 250  copies / mL. A negative result does not preclude SARS-CoV-2 infection  and should not be used as the sole basis for treatment or  other  patient management decisions.  A negative result may occur with  improper specimen collection / handling, submission of specimen other  than nasopharyngeal swab, presence of viral mutation(s) within the  areas targeted by this assay, and inadequate number of viral copies  (<250 copies / mL). A  negative result must be combined with clinical  observations, patient history, and epidemiological information. If result is POSITIVE SARS-CoV-2 target nucleic acids are DETECTED. The SARS-CoV-2 RNA is generally detectable in upper and lower  respiratory specimens dur ing the acute phase of infection.  Positive  results are indicative of active infection with SARS-CoV-2.  Clinical  correlation with patient history and other diagnostic information is  necessary to determine patient infection status.  Positive results do  not rule out bacterial infection or co-infection with other viruses. If result is PRESUMPTIVE POSTIVE SARS-CoV-2 nucleic acids MAY BE PRESENT.   A presumptive positive result was obtained on the submitted specimen  and confirmed on repeat testing.  While 2019 novel coronavirus  (SARS-CoV-2) nucleic acids may be present in the submitted sample  additional confirmatory testing may be necessary for epidemiological  and / or clinical management purposes  to differentiate between  SARS-CoV-2 and other Sarbecovirus currently known to infect humans.  If clinically indicated additional testing with an alternate test  methodology [phone]) is advised. The SARS-CoV-2 RNA is generally  detectable in upper and lower respiratory sp ecimens during the acute  phase of infection. The expected result is Negative. Fact Sheet for Patients:  StrictlyIdeas.no Fact Sheet for Healthcare Providers: BankingDealers.co.za This test is not yet approved or cleared by the [place] FDA and has been authorized for detection and/or diagnosis of  SARS-CoV-2 by FDA under an Emergency Use Authorization (EUA).  This EUA will remain in effect (meaning this test can be used) for the duration of the COVID-19 declaration under Section 564(b)(1) of the Act, 21 U.S.C. section 360bbb-3(b)(1), unless the authorization is terminated or revoked sooner. Performed at [facility], [address] [place], [place] [address]   MRSA PCR Screening     Status: None   Collection Time: 09/06/18 11:56 PM   Specimen: Nasopharyngeal  Result Value Ref Range Status   MRSA by PCR NEGATIVE NEGATIVE Final    Comment:        The GeneXpert MRSA Assay (FDA approved for NASAL specimens only), is one component of a comprehensive MRSA colonization surveillance program. It is not intended to diagnose MRSA infection nor to guide or monitor treatment for MRSA infections. Performed at [facility], [address], [place], [place] [address]      Time coordinating discharge: 35 minutes  SIGNED:   Rodena Goldmann, DO [facility] 09/08/2018, 7:37 AM  If 7PM-7AM, please contact night-coverage [email or web address] Password [email or web address]

## 2018-09-13 DIAGNOSIS — M25551 Pain in right hip: Secondary | ICD-10-CM | POA: Diagnosis not present

## 2018-09-26 ENCOUNTER — Ambulatory Visit (INDEPENDENT_AMBULATORY_CARE_PROVIDER_SITE_OTHER): Payer: Medicare Other | Admitting: Physician Assistant

## 2018-09-26 ENCOUNTER — Encounter: Payer: Self-pay | Admitting: Physician Assistant

## 2018-09-26 DIAGNOSIS — G8929 Other chronic pain: Secondary | ICD-10-CM | POA: Diagnosis not present

## 2018-09-26 DIAGNOSIS — M545 Low back pain: Secondary | ICD-10-CM

## 2018-09-26 DIAGNOSIS — S20212D Contusion of left front wall of thorax, subsequent encounter: Secondary | ICD-10-CM | POA: Diagnosis not present

## 2018-09-26 DIAGNOSIS — Z9181 History of falling: Secondary | ICD-10-CM | POA: Diagnosis not present

## 2018-09-26 DIAGNOSIS — M542 Cervicalgia: Secondary | ICD-10-CM | POA: Diagnosis not present

## 2018-09-26 DIAGNOSIS — M1612 Unilateral primary osteoarthritis, left hip: Secondary | ICD-10-CM

## 2018-09-26 DIAGNOSIS — M6281 Muscle weakness (generalized): Secondary | ICD-10-CM | POA: Diagnosis not present

## 2018-09-26 MED ORDER — HYDROCODONE-ACETAMINOPHEN 10-325 MG PO TABS: 1.0000 | ORAL_TABLET | Freq: Three times a day (TID) | ORAL | 0 refills | Status: DC | PRN

## 2018-09-26 NOTE — Progress Notes (Signed)
Telephone visit  Subjective: CC: Recheck after TIA, chronic leg joint pain, back pain, muscle weakness, high risk for falls PCP: Terald Sleeper, PA-C JOI:NOMVE Robin [name] is a 78 y.o. female calls for telephone consult today. Patient provides verbal consent for consult held via phone.  Patient is identified with 2 separate identifiers.  At this time the entire area is on COVID-19 social distancing and stay home orders are in place.  Patient is of higher risk and therefore we are performing this by a virtual method.  Location of patient: Home Location of provider: HOME Others present for call: None  The patient has recovered from her TIA.  She states that it was only for a day or so and all the symptoms have improved.  She continues with low back pain, chronic right hip pain.  She is having more weakness in her gait.  And is not able to mobility very well.  She also has a high risk for falls and general muscle weakness in her lower legs.   ROS: Per HPI  Allergies  Allergen Reactions  . Dilaudid [Hydromorphone Hcl] Nausea And Vomiting  . Lisinopril Cough  . Valium [Diazepam] Other (See Comments)    Family states patient has adverse reaction and displays confusion.    Past Medical History:  Diagnosis Date  . Arthritis   . Hypercholesteremia   . Hypertension   . Pneumonia   . Reflux     Current Outpatient Medications:  .  albuterol (PROVENTIL HFA;VENTOLIN HFA) 108 (90 Base) MCG/ACT inhaler, Inhale 2 puffs into the lungs every 6 (six) hours as needed for wheezing or shortness of breath., Disp: 1 Inhaler, Rfl: 0 .  ALPRAZolam (XANAX) 0.5 MG tablet, Take 0.5 tablets (0.25 mg total) by mouth at bedtime as needed for anxiety. (Patient taking differently: Take 0.25-0.5 mg by mouth at bedtime. ), Disp: 30 tablet, Rfl: 5 .  aspirin 81 MG tablet, Take 81 mg by mouth at bedtime. , Disp: , Rfl:  .  [START ON 10/10/2018] aspirin EC 325 MG tablet, Take 1 tablet (325 mg total) by mouth  daily., Disp: 100 tablet, Rfl: 3 .  atorvastatin (LIPITOR) 10 MG tablet, Take 1 tablet (10 mg total) by mouth daily at 6 PM., Disp: 30 tablet, Rfl: 3 .  Cholecalciferol (VITAMIN D) 125 MCG (5000 UT) CAPS, Take 5,000 Units by mouth every morning. , Disp: , Rfl:  .  clopidogrel (PLAVIX) 75 MG tablet, Take 1 tablet (75 mg total) by mouth daily with breakfast., Disp: 30 tablet, Rfl: 0 .  diclofenac sodium (VOLTAREN) 1 % GEL, , Disp: , Rfl:  .  docusate sodium (COLACE) 100 MG capsule, Take 100 mg by mouth daily., Disp: , Rfl:  .  HYDROcodone-acetaminophen (NORCO) 10-325 MG tablet, Take 1 tablet by mouth every 8 (eight) hours as needed for moderate pain or severe pain., Disp: 60 tablet, Rfl: 0 .  HYDROcodone-acetaminophen (NORCO) 10-325 MG tablet, Take 1 tablet by mouth every 8 (eight) hours as needed for moderate pain., Disp: 60 tablet, Rfl: 0 .  meclizine (ANTIVERT) 25 MG tablet, Take 1 tablet (25 mg total) by mouth 3 (three) times daily as needed for dizziness. (Patient not taking: Reported on 09/06/2018), Disp: 30 tablet, Rfl: 0 .  pantoprazole (PROTONIX) 20 MG tablet, Take 1 tablet (20 mg total) by mouth daily., Disp: 90 tablet, Rfl: 3  Assessment/ Plan: 78 y.o. female   1. Chest wall contusion, left, subsequent encounter - HYDROcodone-acetaminophen (NORCO) 10-325 MG tablet;  Take 1 tablet by mouth every 8 (eight) hours as needed for moderate pain or severe pain.  Dispense: 60 tablet; Refill: 0  2. Cervical pain - HYDROcodone-acetaminophen (NORCO) 10-325 MG tablet; Take 1 tablet by mouth every 8 (eight) hours as needed for moderate pain or severe pain.  Dispense: 60 tablet; Refill: 0  3. Chronic right-sided low back pain without sciatica - HYDROcodone-acetaminophen (NORCO) 10-325 MG tablet; Take 1 tablet by mouth every 8 (eight) hours as needed for moderate pain or severe pain.  Dispense: 60 tablet; Refill: 0 - HYDROcodone-acetaminophen (NORCO) 10-325 MG tablet; Take 1 tablet by mouth every 8  (eight) hours as needed for moderate pain.  Dispense: 60 tablet; Refill: 0  4. Osteoarthritis of left hip, unspecified osteoarthritis type - Ambulatory referral to [facility] for falls - Ambulatory referral to [facility]  6. Muscle weakness - Ambulatory referral to Home Health   No follow-ups on file.  Continue all other maintenance medications as listed above.  Start time: 8:57 AM End time: 9:06 AM  Meds ordered this encounter  Medications  . HYDROcodone-acetaminophen (NORCO) 10-325 MG tablet    Sig: Take 1 tablet by mouth every 8 (eight) hours as needed for moderate pain or severe pain.    Dispense:  60 tablet    Refill:  0    Order Specific Question:   Supervising Provider    Answer:   Brooke Bonito  . HYDROcodone-acetaminophen (NORCO) 10-325 MG tablet    Sig: Take 1 tablet by mouth every 8 (eight) hours as needed for moderate pain.    Dispense:  60 tablet    Refill:  0    Fill 30 days from original script date    Order Specific Question:   Supervising Provider    Answer:   Terald Sleeper [[id number]]    Particia Nearing PA-C [place] [phone]

## 2018-09-27 ENCOUNTER — Ambulatory Visit (INDEPENDENT_AMBULATORY_CARE_PROVIDER_SITE_OTHER): Payer: Medicare Other | Admitting: *Deleted

## 2018-09-27 DIAGNOSIS — Z0000 Encounter for general adult medical examination without abnormal findings: Secondary | ICD-10-CM

## 2018-09-27 NOTE — Progress Notes (Signed)
MEDICARE ANNUAL WELLNESS VISIT  09/27/2018  Telephone Visit Disclaimer This Medicare AWV was conducted by telephone due to national recommendations for restrictions regarding the COVID-19 Pandemic (e.g. social distancing).  I verified, using two identifiers, that I am speaking with Robin [name] or their authorized healthcare agent. I discussed the limitations, risks, security, and privacy concerns of performing an evaluation and management service by telephone and the potential availability of an in-person appointment in the future. The patient expressed understanding and agreed to proceed.   Subjective:  Robin [name] is a 78 y.o. female patient of Terald Sleeper, PA-C who had a Medicare Annual Wellness Visit today via telephone. Robin [name] is Retired and lives with their daughter. she has 3 living children and 1 deceased. she reports that she is socially active and does interact with friends/family regularly. she is minimally physically active and enjoys reading.She was recently admitted to the hospital for TIA and was prescribed Plavix but pt states she never received this medication from the pharmacy. Will reach out to PCP and pharmacy regarding Plavix.  Patient Care Team: Theodoro Clock as PCP - General (General Practice)  Advanced Directives 09/27/2018 09/07/2018 09/06/2018 02/22/2018 01/02/2018 01/01/2018 12/19/2017  Does Patient Have a Medical Advance Directive? No No No No No No No  Would patient like information on creating a medical advance directive? No - Patient declined No - Patient declined - No - Patient declined No - Patient declined No - Patient declined No - Patient declined    Hospital Utilization Over the Past 12 Months: # of hospitalizations or ER visits: 4 # of surgeries: 2  Review of Systems    Patient reports that her overall health is unchanged compared to last year.  Patient Reported Readings (BP, Pulse, CBG, Weight, etc) none  Review of Systems:  pt c/o some lower back pain which she has on and off for years. The pain is no worse or different then before and is using the heating pad and takes her prescribed pain medication as needed which provides adequate relief.  All other systems negative.  Pain Assessment Pain : 0-10 Pain Score: 4  Pain Type: Chronic pain Pain Location: Back Pain Orientation: Lower Pain Descriptors / Indicators: Aching, Nagging Pain Onset: More than a month ago Pain Frequency: Several days a week Pain Relieving Factors: heating pad and pain medication Effect of Pain on Daily Activities: at times she can't move around as much  Pain Relieving Factors: heating pad and pain medication  Current Medications & Allergies (verified) Allergies as of 09/27/2018      Reactions   Dilaudid [hydromorphone Hcl] Nausea And Vomiting   Lisinopril Cough   Valium [diazepam] Other (See Comments)   Family states patient has adverse reaction and displays confusion.       Medication List       Accurate as of September 27, 2018  8:48 AM. If you have any questions, ask your nurse or doctor.        albuterol 108 (90 Base) MCG/ACT inhaler Commonly known as: VENTOLIN HFA Inhale 2 puffs into the lungs every 6 (six) hours as needed for wheezing or shortness of breath.   ALPRAZolam 0.5 MG tablet Commonly known as: XANAX Take 0.5 tablets (0.25 mg total) by mouth at bedtime as needed for anxiety. What changed:   how much to take  when to take this   aspirin 81 MG tablet Take 81 mg by mouth at bedtime. What changed: Another  medication with the same name was removed. Continue taking this medication, and follow the directions you see here.   atorvastatin 10 MG tablet Commonly known as: LIPITOR Take 1 tablet (10 mg total) by mouth daily at 6 PM.   clopidogrel 75 MG tablet Commonly known as: PLAVIX Take 1 tablet (75 mg total) by mouth daily with breakfast.   diclofenac sodium 1 % Gel Commonly known as: VOLTAREN    docusate sodium 100 MG capsule Commonly known as: COLACE Take 100 mg by mouth daily.   HYDROcodone-acetaminophen 10-325 MG tablet Commonly known as: NORCO Take 1 tablet by mouth every 8 (eight) hours as needed for moderate pain or severe pain.   HYDROcodone-acetaminophen 10-325 MG tablet Commonly known as: NORCO Take 1 tablet by mouth every 8 (eight) hours as needed for moderate pain.   meclizine 25 MG tablet Commonly known as: ANTIVERT Take 1 tablet (25 mg total) by mouth 3 (three) times daily as needed for dizziness.   pantoprazole 20 MG tablet Commonly known as: PROTONIX Take 1 tablet (20 mg total) by mouth daily.   Vitamin D 125 MCG (5000 UT) Caps Take 5,000 Units by mouth every morning.       History (reviewed): Past Medical History:  Diagnosis Date  . Arthritis   . Hypercholesteremia   . Hypertension   . Pneumonia   . Reflux    Past Surgical History:  Procedure Laterality Date  . APPENDECTOMY    . CATARACT EXTRACTION W/PHACO Left 12/19/2017   Procedure: CATARACT EXTRACTION PHACO AND INTRAOCULAR LENS PLACEMENT LEFT EYE;  Surgeon: Tonny Branch, MD;  Location: [facility];  Service: Ophthalmology;  Laterality: Left;  CDE: 26.71  . CATARACT EXTRACTION W/PHACO Right 01/02/2018   Procedure: CATARACT EXTRACTION PHACO AND INTRAOCULAR LENS PLACEMENT (IOC);  Surgeon: Tonny Branch, MD;  Location: [facility];  Service: Ophthalmology;  Laterality: Right;  CDE: 21.87  . JOINT REPLACEMENT    . TOTAL HIP ARTHROPLASTY Right 2012  . TUBAL LIGATION     Family History  Problem Relation Age of Onset  . Diabetes Mother   . Diabetes Sister   . Healthy Daughter   . Healthy Son   . Cancer Brother   . Healthy Daughter    Social History   Socioeconomic History  . Marital status: Widowed    Spouse name: Not on file  . Number of children: 4  . Years of education: 48  . Highest education level: 11th grade  Occupational History  . 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 Financial resource strain: Not very hard  . Food insecurity    Worry: Never true    Inability: Never true  . Transportation needs    Medical: No    Non-medical: No  Tobacco Use  . Smoking status: Former Smoker    Packs/day: 0.25    Years: 5.00    Pack years: 1.25    Types: Cigarettes    Quit date: 06/30/1977    Years since quitting: 41.2  . Smokeless tobacco: Never Used  Substance and Sexual Activity  . Alcohol use: No  . Drug use: No  . Sexual activity: Not Currently  Lifestyle  . Physical activity    Days per week: 7 days    Minutes per session: 60 min  . Stress: Not at all  Relationships  . Social connections    Talks on phone: More than three times a week  Gets together: More than three times a week    Attends religious service: Never    Active member of club or organization: No    Attends meetings of clubs or organizations: Never    Relationship status: Widowed  Other Topics Concern  . Not on file  Social History Narrative  . Not on file    Activities of Daily Living In your present state of health, do you have any difficulty performing the following activities: 09/27/2018 09/07/2018  Hearing? 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 Y N  Comment had cataracts removed about 9 months ago and now seeing dark spots and blurred vision-has appt with eye doctor at the end of this month -  Difficulty concentrating or making decisions? N N  Walking or climbing stairs? Y N  Comment pt doesn't use stairs-she is afraid her hip will give out and she might fall -  Dressing or bathing? N N  Doing errands, shopping? Tempie Donning  Comment her daughters take her to all appointments and run most of her errands -  Conservation officer, nature and eating ? N -  Using the Toilet? N -  In the past six months, have you accidently leaked urine? N -  Do you have problems with loss of bowel control? N -  Managing your Medications? N -   Managing your Finances? N -  Housekeeping or managing your Housekeeping? Y -  Comment pt does some of the housework but her daughters do most of it -  Some recent data might be hidden    Patient Education/ Literacy How often do you need to have someone help you when you read instructions, pamphlets, or other written materials from your doctor or pharmacy?: 1 - Never What is the last grade level you completed in school?: 11th grade  Exercise Current Exercise Habits: Home exercise routine, Type of exercise: walking, Time (Minutes): 60, Frequency (Times/Week): 7, Weekly Exercise (Minutes/Week): 420, Intensity: Mild, Exercise limited by: orthopedic condition(s)  Diet Patient reports consuming 2 meals a day and 2 snack(s) a day Patient reports that her primary diet is: Regular Patient reports that she does have regular access to food.   Depression Screen PHQ 2/9 Scores 09/27/2018 09/04/2018 07/18/2018 05/05/2018 04/21/2018 04/06/2018 03/21/2018  PHQ - 2 Score 0 0 0 0 0 0 0     Fall Risk Fall Risk  09/27/2018 09/04/2018 07/18/2018 05/05/2018 04/21/2018  Falls in the past year? 0 0 0 0 0  Comment - - - - -  Number falls in past yr: - - - - -  Injury with Fall? - - - - -  Comment - - - - -  Risk Factor Category  - - - - -  Risk for fall due to : - - - - -  Follow up - - - - -     Objective:  Robin [name] seemed alert and oriented and she participated appropriately during our telephone visit.  Blood Pressure Weight BMI  BP Readings from Last 3 Encounters:  09/08/18 121/69  09/04/18 136/70  07/18/18 138/64   Wt Readings from Last 3 Encounters:  09/06/18 115 lb 4.8 oz (52.3 kg)  09/04/18 113 lb (51.3 kg)  07/18/18 113 lb (51.3 kg)   BMI Readings from Last 1 Encounters:  09/06/18 22.52 kg/m    *Unable to obtain current vital signs, weight, and BMI due to telephone visit type  Hearing/Vision  . N[place]hlia did  not seem to have difficulty with hearing/understanding during the telephone  conversation . Reports that she has had a formal eye exam by an eye care professional within the past year . Reports that she has not had a formal hearing evaluation within the past year *Unable to fully assess hearing and vision during telephone visit type  Cognitive Function: 6CIT Screen 09/27/2018  What Year? 0 points  What month? 0 points  What time? 0 points  Count back from 20 0 points  Months in reverse 0 points  Repeat phrase 2 points  Total Score 2   (Normal:0-7, Significant for Dysfunction: >8)  Normal Cognitive Function Screening: Yes   Immunization & Health Maintenance Record Immunization History  Administered Date(s) Administered  . Td 11/05/2003    Health Maintenance  Topic Date Due  . DEXA SCAN  08/22/2006  . PNA vac Low Risk Adult (1 of 2 - PCV13) 08/22/2006  . TETANUS/TDAP  11/04/2013  . INFLUENZA VACCINE  09/09/2018       Assessment  This is a routine wellness examination for Robin [name].  Health Maintenance: Due or Overdue Health Maintenance Due  Topic Date Due  . DEXA SCAN  08/22/2006  . PNA vac Low Risk Adult (1 of 2 - PCV13) 08/22/2006  . TETANUS/TDAP  11/04/2013  . INFLUENZA VACCINE  09/09/2018    Robin [name] does not need a referral for Community Assistance: Care Management:   no Social Work:    no Prescription Assistance:  no Nutrition/Diabetes Education:  no   Plan:  Personalized Goals Goals Addressed            This Visit's Progress   . DIET - INCREASE WATER INTAKE       Try to drink 6-8 glasses of water daily.      Personalized Health Maintenance & Screening Recommendations  Pneumococcal vaccine  Influenza vaccine Td vaccine Bone densitometry screening Shingles vaccine  Pt declines all recommended vaccines but states she will schedule a DEXA scan soon  Lung Cancer Screening Recommended: no (Low Dose CT Chest recommended if Age 31-80 years, 30 pack-year currently smoking OR have quit w/in past 15 years)  Hepatitis C Screening recommended: no HIV Screening recommended: no  Advanced Directives: Written information was not prepared per patient's request.  Referrals & Orders No orders of the defined types were placed in this encounter.   Follow-up Plan . Follow-up with Terald Sleeper, PA-C as planned . Schedule your DEXA scan as discussed . Follow-up with your eye doctor at the end of the month as scheduled.   I have personally reviewed and noted the following in the patient's chart:   . Medical and social history . Use of alcohol, tobacco or illicit drugs  . Current medications and supplements . Functional ability and status . Nutritional status . Physical activity . Advanced directives . List of other physicians . Hospitalizations, surgeries, and ER visits in previous 12 months . Vitals . Screenings to include cognitive, depression, and falls . Referrals and appointments  In addition, I have reviewed and discussed with Robin [name] certain preventive protocols, quality metrics, and best practice recommendations. A written personalized care plan for preventive services as well as general preventive health recommendations is available and can be mailed to the patient at her request.      Marylin Crosby, LPN  [id number]

## 2018-09-27 NOTE — Patient Instructions (Signed)
Preventive Care 77 Years and Older, Female Preventive care refers to lifestyle choices and visits with your health care provider that can promote health and wellness. This includes:  A yearly physical exam. This is also called an annual well check.  Regular dental and eye exams.  Immunizations.  Screening for certain conditions.  Healthy lifestyle choices, such as diet and exercise. What can I expect for my preventive care visit? Physical exam Your health care provider will check:  Height and weight. These may be used to calculate body mass index (BMI), which is a measurement that tells if you are at a healthy weight.  Heart rate and blood pressure.  Your skin for abnormal spots. Counseling Your health care provider may ask you questions about:  Alcohol, tobacco, and drug use.  Emotional well-being.  Home and relationship well-being.  Sexual activity.  Eating habits.  History of falls.  Memory and ability to understand (cognition).  Work and work Statistician.  Pregnancy and menstrual history. What immunizations do I need?  Influenza (flu) vaccine  This is recommended every year. Tetanus, diphtheria, and pertussis (Tdap) vaccine  You may need a Td booster every 10 years. Varicella (chickenpox) vaccine  You may need this vaccine if you have not already been vaccinated. Zoster (shingles) vaccine  You may need this after age 33. Pneumococcal conjugate (PCV13) vaccine  One dose is recommended after age 33. Pneumococcal polysaccharide (PPSV23) vaccine  One dose is recommended after age 72. Measles, mumps, and rubella (MMR) vaccine  You may need at least one dose of MMR if you were born in 1957 or later. You may also need a second dose. Meningococcal conjugate (MenACWY) vaccine  You may need this if you have certain conditions. Hepatitis A vaccine  You may need this if you have certain conditions or if you travel or work in places where you may be exposed  to hepatitis A. Hepatitis B vaccine  You may need this if you have certain conditions or if you travel or work in places where you may be exposed to hepatitis B. Haemophilus influenzae type b (Hib) vaccine  You may need this if you have certain conditions. You may receive vaccines as individual doses or as more than one vaccine together in one shot (combination vaccines). Talk with your health care provider about the risks and benefits of combination vaccines. What tests do I need? Blood tests  Lipid and cholesterol levels. These may be checked every 5 years, or more frequently depending on your overall health.  Hepatitis C test.  Hepatitis B test. Screening  Lung cancer screening. You may have this screening every year starting at age 39 if you have a 30-pack-year history of smoking and currently smoke or have quit within the past 15 years.  Colorectal cancer screening. All adults should have this screening starting at age 36 and continuing until age 15. Your health care provider may recommend screening at age 23 if you are at increased risk. You will have tests every 1-10 years, depending on your results and the type of screening test.  Diabetes screening. This is done by checking your blood sugar (glucose) after you have not eaten for a while (fasting). You may have this done every 1-3 years.  Mammogram. This may be done every 1-2 years. Talk with your health care provider about how often you should have regular mammograms.  BRCA-related cancer screening. This may be done if you have a family history of breast, ovarian, tubal, or peritoneal cancers.  Other tests  Sexually transmitted disease (STD) testing.  Bone density scan. This is done to screen for osteoporosis. You may have this done starting at age 55. Follow these instructions at home: Eating and drinking  Eat a diet that includes fresh fruits and vegetables, whole grains, lean protein, and low-fat dairy products. Limit  your intake of foods with high amounts of sugar, saturated fats, and salt.  Take vitamin and mineral supplements as recommended by your health care provider.  Do not drink alcohol if your health care provider tells you not to drink.  If you drink alcohol: ? Limit how much you have to 0-1 drink a day. ? Be aware of how much alcohol is in your drink. In the [place] one drink equals one 12 oz bottle of beer (355 mL), one 5 oz glass of wine (148 mL), or one 1 oz glass of hard liquor (44 mL). Lifestyle  Take daily care of your teeth and gums.  Stay active. Exercise for at least 30 minutes on 5 or more days each week.  Do not use any products that contain nicotine or tobacco, such as cigarettes, e-cigarettes, and chewing tobacco. If you need help quitting, ask your health care provider.  If you are sexually active, practice safe sex. Use a condom or other form of protection in order to prevent STIs (sexually transmitted infections).  Talk with your health care provider about taking a low-dose aspirin or statin. What's next?  Go to your health care provider once a year for a well check visit.  Ask your health care provider how often you should have your eyes and teeth checked.  Stay up to date on all vaccines. This information is not intended to replace advice given to you by your health care provider. Make sure you discuss any questions you have with your health care provider. Document Released: 02/21/2015 Document Revised: 01/19/2018 Document Reviewed: 01/19/2018 Elsevier Patient Education  2020 [employer].

## 2018-10-02 ENCOUNTER — Ambulatory Visit: Payer: Medicare Other | Admitting: Physician Assistant

## 2018-10-04 ENCOUNTER — Ambulatory Visit (INDEPENDENT_AMBULATORY_CARE_PROVIDER_SITE_OTHER): Payer: Medicare Other | Admitting: Physician Assistant

## 2018-10-04 DIAGNOSIS — G459 Transient cerebral ischemic attack, unspecified: Secondary | ICD-10-CM

## 2018-10-04 DIAGNOSIS — R29898 Other symptoms and signs involving the musculoskeletal system: Secondary | ICD-10-CM

## 2018-10-04 NOTE — Progress Notes (Signed)
Face to face     Telephone visit  Subjective: ZO:XWRUEAVWU strength PCP: Terald Sleeper, PA-C JWJ:XBJYN Areta Terwilliger is a 78 y.o. female calls for telephone consult today. Patient provides verbal consent for consult held via phone.  Patient is identified with 2 separate identifiers.  At this time the entire area is on COVID-19 social distancing and stay home orders are in place.  Patient is of higher risk and therefore we are performing this by a virtual method.  Location of patient: home Location of provider: HOME Others present for call: no  This patient has had decreased strength in her arms and legs ever since she had her TIA.  It is slightly improving.  We had discussed having physical therapy at the last visit and her daughter confirms that they would like to proceed with it in the near future.  She also is having some forgetfulness.  The patient states that she is able to take care of herself however she is quite needy for many of her day-to-day activities.   ROS: Per HPI  Allergies  Allergen Reactions  . Dilaudid [Hydromorphone Hcl] Nausea And Vomiting  . Lisinopril Cough  . Valium [Diazepam] Other (See Comments)    Family states patient has adverse reaction and displays confusion.    Past Medical History:  Diagnosis Date  . Arthritis   . Hypercholesteremia   . Hypertension   . Pneumonia   . Reflux     Current Outpatient Medications:  .  albuterol (PROVENTIL HFA;VENTOLIN HFA) 108 (90 Base) MCG/ACT inhaler, Inhale 2 puffs into the lungs every 6 (six) hours as needed for wheezing or shortness of breath., Disp: 1 Inhaler, Rfl: 0 .  ALPRAZolam (XANAX) 0.5 MG tablet, Take 0.5 tablets (0.25 mg total) by mouth at bedtime as needed for anxiety. (Patient taking differently: Take 0.25-0.5 mg by mouth at bedtime. ), Disp: 30 tablet, Rfl: 5 .  aspirin 81 MG tablet, Take 81 mg by mouth at bedtime. , Disp: , Rfl:  .  atorvastatin (LIPITOR) 10 MG tablet, Take 1 tablet (10 mg  total) by mouth daily at 6 PM., Disp: 30 tablet, Rfl: 3 .  Cholecalciferol (VITAMIN D) 125 MCG (5000 UT) CAPS, Take 5,000 Units by mouth every morning. , Disp: , Rfl:  .  clopidogrel (PLAVIX) 75 MG tablet, Take 1 tablet (75 mg total) by mouth daily with breakfast. (Patient not taking: Reported on 09/27/2018), Disp: 30 tablet, Rfl: 0 .  diclofenac sodium (VOLTAREN) 1 % GEL, , Disp: , Rfl:  .  docusate sodium (COLACE) 100 MG capsule, Take 100 mg by mouth daily., Disp: , Rfl:  .  HYDROcodone-acetaminophen (NORCO) 10-325 MG tablet, Take 1 tablet by mouth every 8 (eight) hours as needed for moderate pain or severe pain., Disp: 60 tablet, Rfl: 0 .  HYDROcodone-acetaminophen (NORCO) 10-325 MG tablet, Take 1 tablet by mouth every 8 (eight) hours as needed for moderate pain., Disp: 60 tablet, Rfl: 0 .  meclizine (ANTIVERT) 25 MG tablet, Take 1 tablet (25 mg total) by mouth 3 (three) times daily as needed for dizziness., Disp: 30 tablet, Rfl: 0 .  pantoprazole (PROTONIX) 20 MG tablet, Take 1 tablet (20 mg total) by mouth daily., Disp: 90 tablet, Rfl: 3  Assessment/ Plan: 78 y.o. female   There are no diagnoses linked to this encounter.  No follow-ups on file.  Continue all other maintenance medications as listed above.  Start time: 3:11 PM End time: 3:25 PM  No orders of the defined  types were placed in this encounter.   Particia Nearing PA-C [facility] [phone]

## 2018-10-08 DIAGNOSIS — G459 Transient cerebral ischemic attack, unspecified: Secondary | ICD-10-CM | POA: Insufficient documentation

## 2018-10-08 DIAGNOSIS — R29898 Other symptoms and signs involving the musculoskeletal system: Secondary | ICD-10-CM | POA: Insufficient documentation

## 2018-10-08 HISTORY — DX: Transient cerebral ischemic attack, unspecified: G45.9

## 2018-10-09 DIAGNOSIS — H02051 Trichiasis without entropian right upper eyelid: Secondary | ICD-10-CM | POA: Diagnosis not present

## 2018-10-09 DIAGNOSIS — H40013 Open angle with borderline findings, low risk, bilateral: Secondary | ICD-10-CM | POA: Diagnosis not present

## 2018-10-09 DIAGNOSIS — H353131 Nonexudative age-related macular degeneration, bilateral, early dry stage: Secondary | ICD-10-CM | POA: Diagnosis not present

## 2018-11-28 ENCOUNTER — Telehealth: Payer: Self-pay | Admitting: Physician Assistant

## 2018-11-28 NOTE — Telephone Encounter (Signed)
Appointment made

## 2018-11-29 ENCOUNTER — Encounter: Payer: Self-pay | Admitting: Physician Assistant

## 2018-11-29 ENCOUNTER — Ambulatory Visit (INDEPENDENT_AMBULATORY_CARE_PROVIDER_SITE_OTHER): Payer: Medicare Other | Admitting: Physician Assistant

## 2018-11-29 DIAGNOSIS — G8929 Other chronic pain: Secondary | ICD-10-CM

## 2018-11-29 DIAGNOSIS — S20212D Contusion of left front wall of thorax, subsequent encounter: Secondary | ICD-10-CM | POA: Diagnosis not present

## 2018-11-29 DIAGNOSIS — I959 Hypotension, unspecified: Secondary | ICD-10-CM

## 2018-11-29 DIAGNOSIS — M542 Cervicalgia: Secondary | ICD-10-CM | POA: Diagnosis not present

## 2018-11-29 DIAGNOSIS — M545 Low back pain: Secondary | ICD-10-CM

## 2018-11-29 DIAGNOSIS — I6930 Unspecified sequelae of cerebral infarction: Secondary | ICD-10-CM | POA: Diagnosis not present

## 2018-11-29 DIAGNOSIS — I639 Cerebral infarction, unspecified: Secondary | ICD-10-CM

## 2018-11-29 MED ORDER — ATORVASTATIN CALCIUM 10 MG PO TABS: 10.0000 mg | ORAL_TABLET | Freq: Every day | ORAL | 3 refills | Status: DC

## 2018-11-29 MED ORDER — HYDROCODONE-ACETAMINOPHEN 10-325 MG PO TABS: 1.0000 | ORAL_TABLET | Freq: Three times a day (TID) | ORAL | 0 refills | Status: DC | PRN
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
## 2018-12-22 ENCOUNTER — Ambulatory Visit (INDEPENDENT_AMBULATORY_CARE_PROVIDER_SITE_OTHER): Payer: Medicare Other | Admitting: Family Medicine

## 2018-12-22 DIAGNOSIS — G8929 Other chronic pain: Secondary | ICD-10-CM | POA: Insufficient documentation

## 2018-12-22 DIAGNOSIS — M25551 Pain in right hip: Secondary | ICD-10-CM | POA: Diagnosis not present

## 2018-12-22 MED ORDER — PREDNISONE 20 MG PO TABS: ORAL_TABLET | ORAL | 0 refills | Status: DC

## 2018-12-22 NOTE — Progress Notes (Signed)
Virtual Visit via telephone Note Due to COVID-19 pandemic this visit was conducted virtually. This visit type was conducted due to national recommendations for restrictions regarding the COVID-19 Pandemic (e.g. social distancing, sheltering in place) in an effort to limit this patient's exposure and mitigate transmission in our community. All issues noted in this document were discussed and addressed.  A physical exam was not performed with this format.   I connected with Robin [name] on 12/22/2018 at 1300 by telephone and verified that I am speaking with the correct person using two identifiers. Robin [name] is currently located at home and no one is currently with them during visit. The provider, Monia Pouch, FNP is located in their office at time of visit.  I discussed the limitations, risks, security and privacy concerns of performing an evaluation and management service by telephone and the availability of in person appointments. I also discussed with the patient that there may be a patient responsible charge related to this service. The patient expressed understanding and agreed to proceed.  Subjective:  Patient ID: Robin [name], female    DOB: 1940-08-03, 78 y.o.   MRN: [id number]  Chief Complaint:  Hip Pain   HPI: Robin [name] is a 78 y.o. female presenting on 12/22/2018 for Hip Pain   Pt reports ongoing hip pain. Pt states she has been seen by ortho and was told her images looked good and there was nothing more they could do for her pain. She reports she is using her walker but has pain with ambulating and the pain seems to be worsening. No reported injury. States the pain is in her right hip, worse with weight bearing and ambulating. 6-8/10 at times. She is on hydrocodone for her chronic pain and states this is not helping the pain. Pt declines to go to ED or UC for evaluation. She also declines referral to chronic pain management. She has been to ortho. No  fever, chills, swelling or joint, erythema of joint, or increased warmth to hip.     Relevant past medical, surgical, family, and social history reviewed and updated as indicated.  Allergies and medications reviewed and updated.   Past Medical History:  Diagnosis Date  . Arthritis   . Hypercholesteremia   . Hypertension   . Pneumonia   . Reflux     Past Surgical History:  Procedure Laterality Date  . APPENDECTOMY    . CATARACT EXTRACTION W/PHACO Left 12/19/2017   Procedure: CATARACT EXTRACTION PHACO AND INTRAOCULAR LENS PLACEMENT LEFT EYE;  Surgeon: Tonny Branch, MD;  Location: [facility];  Service: Ophthalmology;  Laterality: Left;  CDE: 26.71  . CATARACT EXTRACTION W/PHACO Right 01/02/2018   Procedure: CATARACT EXTRACTION PHACO AND INTRAOCULAR LENS PLACEMENT (IOC);  Surgeon: Tonny Branch, MD;  Location: [facility];  Service: Ophthalmology;  Laterality: Right;  CDE: 21.87  . JOINT REPLACEMENT    . TOTAL HIP ARTHROPLASTY Right 2012  . TUBAL LIGATION      Social History   Socioeconomic History  . Marital status: Widowed    Spouse name: Not on file  . Number of children: 4  . Years of education: 36  . Highest education level: 11th grade  Occupational History  . 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 Financial resource strain: Not very hard  . Food insecurity    Worry: Never true    Inability: Never true  . Transportation needs  Medical: No    Non-medical: No  Tobacco Use  . Smoking status: Former Smoker    Packs/day: 0.25    Years: 5.00    Pack years: 1.25    Types: Cigarettes    Quit date: 06/30/1977    Years since quitting: 41.5  . Smokeless tobacco: Never Used  Substance and Sexual Activity  . Alcohol use: No  . Drug use: No  . Sexual activity: Not Currently  Lifestyle  . Physical activity    Days per week: 7 days    Minutes per session: 60 min  . Stress: Not at all  Relationships  . Social connections     Talks on phone: More than three times a week    Gets together: More than three times a week    Attends religious service: Never    Active member of club or organization: No    Attends meetings of clubs or organizations: Never    Relationship status: Widowed  . Intimate partner violence    Fear of current or ex partner: No    Emotionally abused: No    Physically abused: No    Forced sexual activity: No  Other Topics Concern  . Not on file  Social History Narrative  . Not on file    Outpatient Encounter Medications as of 12/22/2018  Medication Sig  . albuterol (PROVENTIL HFA;VENTOLIN HFA) 108 (90 Base) MCG/ACT inhaler Inhale 2 puffs into the lungs every 6 (six) hours as needed for wheezing or shortness of breath.  . ALPRAZolam (XANAX) 0.5 MG tablet Take 0.5 tablets (0.25 mg total) by mouth at bedtime as needed for anxiety. (Patient taking differently: Take 0.25-0.5 mg by mouth at bedtime. )  . aspirin 81 MG tablet Take 81 mg by mouth at bedtime.   Marland Kitchen atorvastatin (LIPITOR) 10 MG tablet Take 1 tablet (10 mg total) by mouth daily at 6 PM.  . Cholecalciferol (VITAMIN D) 125 MCG (5000 UT) CAPS Take 5,000 Units by mouth every morning.   . diclofenac sodium (VOLTAREN) 1 % GEL   . docusate sodium (COLACE) 100 MG capsule Take 100 mg by mouth daily.  Marland Kitchen HYDROcodone-acetaminophen (NORCO) 10-325 MG tablet Take 1 tablet by mouth every 8 (eight) hours as needed for moderate pain or severe pain.  Marland Kitchen HYDROcodone-acetaminophen (NORCO) 10-325 MG tablet Take 1 tablet by mouth every 8 (eight) hours as needed for moderate pain.  . meclizine (ANTIVERT) 25 MG tablet Take 1 tablet (25 mg total) by mouth 3 (three) times daily as needed for dizziness.  . pantoprazole (PROTONIX) 20 MG tablet Take 1 tablet (20 mg total) by mouth daily.  . predniSONE (DELTASONE) 20 MG tablet 2 po at sametime daily for 5 days   No facility-administered encounter medications on file as of 12/22/2018.     Allergies  Allergen  Reactions  . Dilaudid [Hydromorphone Hcl] Nausea And Vomiting  . Lisinopril Cough  . Valium [Diazepam] Other (See Comments)    Family states patient has adverse reaction and displays confusion.     Review of Systems  Constitutional: Negative for activity change, appetite change, chills, diaphoresis, fatigue, fever and unexpected weight change.  HENT: Negative.   Eyes: Negative.   Respiratory: Negative for cough, chest tightness and shortness of breath.   Cardiovascular: Negative for chest pain, palpitations and leg swelling.  Gastrointestinal: Negative for blood in stool, constipation, diarrhea, nausea and vomiting.  Endocrine: Negative.   Genitourinary: Negative for decreased urine volume, difficulty urinating, dysuria, frequency and urgency.  Musculoskeletal: Positive for arthralgias and gait problem. Negative for joint swelling and myalgias.  Skin: Negative.   Allergic/Immunologic: Negative.   Neurological: Negative for dizziness and headaches.  Hematological: Negative.   Psychiatric/Behavioral: Negative for confusion, hallucinations, sleep disturbance and suicidal ideas.  All other systems reviewed and are negative.        Observations/Objective: No vital signs or physical exam, this was a telephone or virtual health encounter.  Pt alert and oriented, answers all questions appropriately, and able to speak in full sentences.    Assessment and Plan: Robin [name] was seen today for hip pain.  Diagnoses and all orders for this visit:  Chronic right hip pain Chronic right hip pain not controlled by hydrocodone. Offered referral to pain management, pt refused. Pt declines to go to ED or UC for evaluation of increased pain. No fever, chills, redness, or swelling of joint. No red flags concerning for septic joint. Will give burst of steroids. Declines Voltaren gel. Pt aware to follow up with PCP if pain persists or worsens.  -     predniSONE (DELTASONE) 20 MG tablet; 2 po at sametime  daily for 5 days     Follow Up Instructions: Return if symptoms worsen or fail to improve.    I discussed the assessment and treatment plan with the patient. The patient was provided an opportunity to ask questions and all were answered. The patient agreed with the plan and demonstrated an understanding of the instructions.   The patient was advised to call back or seek an in-person evaluation if the symptoms worsen or if the condition fails to improve as anticipated.  The above assessment and management plan was discussed with the patient. The patient verbalized understanding of and has agreed to the management plan. Patient is aware to call the clinic if they develop any new symptoms or if symptoms persist or worsen. Patient is aware when to return to the clinic for a follow-up visit. Patient educated on when it is appropriate to go to the emergency department.    I provided 15 minutes of non-face-to-face time during this encounter. The call started at 1300. The call ended at 1315. The other time was used for coordination of care.    Monia Pouch, FNP-C [facility] [address] [place], [place] [address] [phone] 12/22/2018

## 2018-12-23 ENCOUNTER — Encounter: Payer: Self-pay | Admitting: Family Medicine

## 2019-01-03 ENCOUNTER — Encounter: Payer: Self-pay | Admitting: Physician Assistant

## 2019-01-03 ENCOUNTER — Ambulatory Visit (INDEPENDENT_AMBULATORY_CARE_PROVIDER_SITE_OTHER): Payer: Medicare Other | Admitting: Physician Assistant

## 2019-01-03 ENCOUNTER — Other Ambulatory Visit: Payer: Self-pay

## 2019-01-03 DIAGNOSIS — M545 Low back pain, unspecified: Secondary | ICD-10-CM

## 2019-01-03 DIAGNOSIS — M1612 Unilateral primary osteoarthritis, left hip: Secondary | ICD-10-CM

## 2019-01-03 DIAGNOSIS — G8929 Other chronic pain: Secondary | ICD-10-CM

## 2019-01-03 MED ORDER — HYDROCODONE-ACETAMINOPHEN 10-325 MG PO TABS: 1.0000 | ORAL_TABLET | Freq: Three times a day (TID) | ORAL | 0 refills | Status: DC | PRN

## 2019-01-03 NOTE — Progress Notes (Signed)
Telephone visit  Subjective: YH:CWCBJ pain, OA PCP: Terald Sleeper, PA-C SEG:BTDVV Robin [name] is a 78 y.o. female calls for telephone consult today. Patient provides verbal consent for consult held via phone.  Patient is identified with 2 separate identifiers.  At this time the entire area is on COVID-19 social distancing and stay home orders are in place.  Patient is of higher risk and therefore we are performing this by a virtual method.  Location of patient: home Location of provider: HOME Others present for call: no  Patient reports that her joint pain is gotten quite severe since it has gotten cold.  She does have known degenerative joint disease particularly at arthritis in her hips.  She also has degenerative disc in her back.  Her worst area pain is on the hip worse after she has left and whenever she gets up from sitting for very long.  She is quite stiff at times.  She would like to have pain medicine for this to be used on a fairly regular basis.  I explained her that we cannot use Xanax plus the pain medicine at the same time and she had only been using the Xanax as needed.  So we will have her discontinue that we will have her use the pain medication on a once or twice daily basis.  I will have her come back in 2 months for recheck.   ROS: Per HPI  Allergies  Allergen Reactions  . Dilaudid [Hydromorphone Hcl] Nausea And Vomiting  . Lisinopril Cough  . Valium [Diazepam] Other (See Comments)    Family states patient has adverse reaction and displays confusion.    Past Medical History:  Diagnosis Date  . Arthritis   . Hypercholesteremia   . Hypertension   . Pneumonia   . Reflux     Current Outpatient Medications:  .  albuterol (PROVENTIL HFA;VENTOLIN HFA) 108 (90 Base) MCG/ACT inhaler, Inhale 2 puffs into the lungs every 6 (six) hours as needed for wheezing or shortness of breath., Disp: 1 Inhaler, Rfl: 0 .  aspirin 81 MG tablet, Take 81 mg by mouth at bedtime. ,  Disp: , Rfl:  .  atorvastatin (LIPITOR) 10 MG tablet, Take 1 tablet (10 mg total) by mouth daily at 6 PM., Disp: 90 tablet, Rfl: 3 .  Cholecalciferol (VITAMIN D) 125 MCG (5000 UT) CAPS, Take 5,000 Units by mouth every morning. , Disp: , Rfl:  .  diclofenac sodium (VOLTAREN) 1 % GEL, , Disp: , Rfl:  .  docusate sodium (COLACE) 100 MG capsule, Take 100 mg by mouth daily., Disp: , Rfl:  .  HYDROcodone-acetaminophen (NORCO) 10-325 MG tablet, Take 1 tablet by mouth every 8 (eight) hours as needed for moderate pain or severe pain., Disp: 60 tablet, Rfl: 0 .  HYDROcodone-acetaminophen (NORCO) 10-325 MG tablet, Take 1 tablet by mouth every 8 (eight) hours as needed for moderate pain., Disp: 60 tablet, Rfl: 0 .  meclizine (ANTIVERT) 25 MG tablet, Take 1 tablet (25 mg total) by mouth 3 (three) times daily as needed for dizziness., Disp: 30 tablet, Rfl: 0 .  pantoprazole (PROTONIX) 20 MG tablet, Take 1 tablet (20 mg total) by mouth daily., Disp: 90 tablet, Rfl: 3 .  predniSONE (DELTASONE) 20 MG tablet, 2 po at sametime daily for 5 days, Disp: 10 tablet, Rfl: 0  Assessment/ Plan: 78 y.o. female   1. Chronic right-sided low back pain without sciatica - HYDROcodone-acetaminophen (NORCO) 10-325 MG tablet; Take 1 tablet by  mouth every 8 (eight) hours as needed for moderate pain or severe pain.  Dispense: 60 tablet; Refill: 0 - HYDROcodone-acetaminophen (NORCO) 10-325 MG tablet; Take 1 tablet by mouth every 8 (eight) hours as needed for moderate pain.  Dispense: 60 tablet; Refill: 0  2. Osteoarthritis of left hip, unspecified osteoarthritis type - HYDROcodone-acetaminophen (NORCO) 10-325 MG tablet; Take 1 tablet by mouth every 8 (eight) hours as needed for moderate pain or severe pain.  Dispense: 60 tablet; Refill: 0 - HYDROcodone-acetaminophen (NORCO) 10-325 MG tablet; Take 1 tablet by mouth every 8 (eight) hours as needed for moderate pain.  Dispense: 60 tablet; Refill: 0     No follow-ups on file.   Continue all other maintenance medications as listed above.  Start time: 1:55 PM End time: 2:06 PM  Meds ordered this encounter  Medications  . HYDROcodone-acetaminophen (NORCO) 10-325 MG tablet    Sig: Take 1 tablet by mouth every 8 (eight) hours as needed for moderate pain or severe pain.    Dispense:  60 tablet    Refill:  0    Order Specific Question:   Supervising Provider    Answer:   Janora Norlander [[id number]]  . HYDROcodone-acetaminophen (NORCO) 10-325 MG tablet    Sig: Take 1 tablet by mouth every 8 (eight) hours as needed for moderate pain.    Dispense:  60 tablet    Refill:  0    Fill 30 days from original script date    Order Specific Question:   Supervising Provider    Answer:   Janora Norlander [[id number]]    Particia Nearing PA-C [facility] [phone]

## 2019-03-13 ENCOUNTER — Telehealth: Payer: Self-pay | Admitting: Physician Assistant

## 2019-03-13 ENCOUNTER — Other Ambulatory Visit: Payer: Self-pay | Admitting: Family Medicine

## 2019-03-13 NOTE — Telephone Encounter (Signed)
What is the name of the medication?  Xanax   Have you contacted your pharmacy to request a refill?  Yes  Which pharmacy [facility] you [facility] [place]   Pt's daughter called stating that pt is out of her Xanax Rx and needs refill.   Patient notified that their request is being sent to the clinical staff for review and that they should receive a call once it is complete. If they do not receive a call within 24 hours they can check with their pharmacy or our office.

## 2019-03-13 NOTE — Telephone Encounter (Signed)
Med not on list. Spoke with daughter states she gets #30 of them. Per chart in historical meds look like last written for 01/03/19 for # 30 [facility] in [place]. Called and spoke with Aaron Edelman at the drug store and last time it was filled was 01/04/19 for #30. Please advise.

## 2019-03-13 NOTE — Telephone Encounter (Signed)
It was discussed that she could not be presrcibed both pain and anxiety medication.  I thought she was going to continue with pain

## 2019-03-13 NOTE — Telephone Encounter (Signed)
Patient daughter aware and verbalizes understanding.

## 2019-04-03 ENCOUNTER — Other Ambulatory Visit: Payer: Self-pay | Admitting: Physician Assistant

## 2019-04-03 NOTE — Telephone Encounter (Signed)
#  60 with 0 refills given 01/03/19 for Norco  Pt last seen 01/03/19  Please review for refill

## 2019-04-03 NOTE — Telephone Encounter (Signed)
Appointment scheduled for 04/09/19 with Particia Nearing.

## 2019-04-03 NOTE — Telephone Encounter (Signed)
What is the name of the medication? HYDROcodone-acetaminophen (NORCO) 10-325 MG tablet  Have you contacted your pharmacy to request a refill? No, needs apt. Pt is out and pcp is not available till 04/16/2019  Which pharmacy would you like this sent to? [facility]   Patient notified that their request is being sent to the clinical staff for review and that they should receive a call once it is complete. If they do not receive a call within 24 hours they can check with their pharmacy or our office.

## 2019-04-03 NOTE — Telephone Encounter (Signed)
Medication refill is denied, patient needs to be seen in the office.  She was told that at the follow-up visit lab orders are in place for her to come in.  And she does need updated contract.

## 2019-04-06 ENCOUNTER — Other Ambulatory Visit: Payer: Self-pay

## 2019-04-09 ENCOUNTER — Other Ambulatory Visit: Payer: Self-pay

## 2019-04-09 ENCOUNTER — Encounter: Payer: Self-pay | Admitting: Physician Assistant

## 2019-04-09 ENCOUNTER — Ambulatory Visit (INDEPENDENT_AMBULATORY_CARE_PROVIDER_SITE_OTHER): Payer: Medicare Other | Admitting: Physician Assistant

## 2019-04-09 VITALS — BP 122/69 | HR 75 | Temp 99.6°F | Ht 60.0 in | Wt 97.4 lb

## 2019-04-09 DIAGNOSIS — I959 Hypotension, unspecified: Secondary | ICD-10-CM | POA: Diagnosis not present

## 2019-04-09 DIAGNOSIS — I6930 Unspecified sequelae of cerebral infarction: Secondary | ICD-10-CM

## 2019-04-09 DIAGNOSIS — F411 Generalized anxiety disorder: Secondary | ICD-10-CM

## 2019-04-09 DIAGNOSIS — G8929 Other chronic pain: Secondary | ICD-10-CM | POA: Diagnosis not present

## 2019-04-09 DIAGNOSIS — K219 Gastro-esophageal reflux disease without esophagitis: Secondary | ICD-10-CM | POA: Diagnosis not present

## 2019-04-09 DIAGNOSIS — I639 Cerebral infarction, unspecified: Secondary | ICD-10-CM | POA: Diagnosis not present

## 2019-04-09 DIAGNOSIS — M545 Low back pain, unspecified: Secondary | ICD-10-CM

## 2019-04-09 MED ORDER — ALPRAZOLAM 0.5 MG PO TABS: 0.5000 mg | ORAL_TABLET | Freq: Every day | ORAL | 5 refills | Status: DC

## 2019-04-09 MED ORDER — MELOXICAM 7.5 MG PO TABS: 7.5000 mg | ORAL_TABLET | Freq: Every day | ORAL | 0 refills | Status: DC | PRN

## 2019-04-09 MED ORDER — PANTOPRAZOLE SODIUM 20 MG PO TBEC: 20.0000 mg | DELAYED_RELEASE_TABLET | Freq: Every day | ORAL | 3 refills | Status: DC

## 2019-04-10 LAB — CBC WITH DIFFERENTIAL/PLATELET
Basophils Absolute: 0 10*3/uL (ref 0.0–0.2)
Basos: 0 %
EOS (ABSOLUTE): 0.1 10*3/uL (ref 0.0–0.4)
Eos: 1 %
Hematocrit: 35.8 % (ref 34.0–46.6)
Hemoglobin: 11.2 g/dL (ref 11.1–15.9)
Immature Grans (Abs): 0 10*3/uL (ref 0.0–0.1)
Immature Granulocytes: 0 %
Lymphocytes Absolute: 1.5 10*3/uL (ref 0.7–3.1)
Lymphs: 27 %
MCH: 28.1 pg (ref 26.6–33.0)
MCHC: 31.3 g/dL — ABNORMAL LOW (ref 31.5–35.7)
MCV: 90 fL (ref 79–97)
Monocytes Absolute: 0.5 10*3/uL (ref 0.1–0.9)
Monocytes: 8 %
Neutrophils Absolute: 3.5 10*3/uL (ref 1.4–7.0)
Neutrophils: 64 %
Platelets: 289 10*3/uL (ref 150–450)
RBC: 3.99 x10E6/uL (ref 3.77–5.28)
RDW: 14.1 % (ref 11.7–15.4)
WBC: 5.5 10*3/uL (ref 3.4–10.8)

## 2019-04-10 LAB — CMP14+EGFR
ALT: 3 IU/L (ref 0–32)
AST: 10 IU/L (ref 0–40)
Albumin/Globulin Ratio: 2.5 — ABNORMAL HIGH (ref 1.2–2.2)
Albumin: 4.2 g/dL (ref 3.7–4.7)
Alkaline Phosphatase: 105 IU/L (ref 39–117)
BUN/Creatinine Ratio: 14 (ref 12–28)
BUN: 21 mg/dL (ref 8–27)
Bilirubin Total: 0.4 mg/dL (ref 0.0–1.2)
CO2: 21 mmol/L (ref 20–29)
Calcium: 9.7 mg/dL (ref 8.7–10.3)
Chloride: 107 mmol/L — ABNORMAL HIGH (ref 96–106)
Creatinine, Ser: 1.55 mg/dL — ABNORMAL HIGH (ref 0.57–1.00)
GFR calc Af Amer: 37 mL/min/{1.73_m2} — ABNORMAL LOW (ref >59)
GFR calc non Af Amer: 32 mL/min/{1.73_m2} — ABNORMAL LOW (ref >59)
Globulin, Total: 1.7 g/dL (ref 1.5–4.5)
Glucose: 99 mg/dL (ref 65–99)
Potassium: 4.5 mmol/L (ref 3.5–5.2)
Sodium: 144 mmol/L (ref 134–144)
Total Protein: 5.9 g/dL — ABNORMAL LOW (ref 6.0–8.5)

## 2019-04-10 LAB — LIPID PANEL
Chol/HDL Ratio: 2.2 ratio (ref 0.0–4.4)
Cholesterol, Total: 190 mg/dL (ref 100–199)
HDL: 88 mg/dL (ref >39)
LDL Chol Calc (NIH): 90 mg/dL (ref 0–99)
Triglycerides: 66 mg/dL (ref 0–149)
VLDL Cholesterol Cal: 12 mg/dL (ref 5–40)

## 2019-04-12 NOTE — Progress Notes (Signed)
BP 122/69   Pulse 75   Temp 99.6 F (37.6 C)   Ht 5' (1.524 m)   Wt 97 lb 6.4 oz (44.2 kg)   SpO2 99%   BMI 19.02 kg/m    Subjective:    Patient ID: Robin [name], female    DOB: Feb 26, 1940, 79 y.o.   MRN: [id number]   1. Chronic right-sided low back pain without sciatica  2. GAD (generalized anxiety disorder)  3. Gastro-esophageal reflux disease without esophagitis  4. Hypotension, unspecified hypotension type  5. Cerebrovascular accident (CVA), unspecified mechanism ([place])   HPI: Robin [name] is a 79 y.o. female presenting on 04/09/2019 for Medication Refill (pain meds) This patient comes in for recheck on her chronic medications.  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  She does take the hydrocodone very infrequently.  We are going to discontinue this 1 altogether.  She will continue however her alprazolam which she takes 1 at bedtime.  She does need to have refills on this.  The PDMP is checked and there are no red flags. Contract 04/09/2019 LME 0.5 Patient has been out of medicine for couple weeks they are to come back in a couple weeks for labs and give the tox assure.   Past Medical History:  Diagnosis Date  . Arthritis   . Hypercholesteremia   . Hypertension   . Pneumonia   . Reflux    Relevant past medical, surgical, family and social history reviewed and updated as indicated. Interim medical history since our last visit reviewed. Allergies and medications reviewed and updated. DATA REVIEWED: CHART IN EPIC  Family History reviewed for pertinent findings.  Review of Systems  Constitutional: Negative.   HENT: Negative.   Eyes: Negative.   Respiratory: Negative.   Gastrointestinal: Negative.   Genitourinary: Negative.     Allergies as of 04/09/2019      Reactions   Dilaudid [hydromorphone Hcl] Nausea And Vomiting   Lisinopril Cough   Valium [diazepam] Other (See Comments)   Family states  patient has adverse reaction and displays confusion.       Medication List       Accurate as of April 09, 2019 11:59 PM. If you have any questions, ask your nurse or doctor.        STOP taking these medications   HYDROcodone-acetaminophen 10-325 MG tablet Commonly known as: NORCO Stopped by: Terald Sleeper, PA-C   predniSONE 20 MG tablet Commonly known as: Deltasone Stopped by: Terald Sleeper, PA-C     TAKE these medications   albuterol 108 (90 Base) MCG/ACT inhaler Commonly known as: VENTOLIN HFA Inhale 2 puffs into the lungs every 6 (six) hours as needed for wheezing or shortness of breath.   ALPRAZolam 0.5 MG tablet Commonly known as: XANAX Take 1 tablet (0.5 mg total) by mouth at bedtime. What changed:   how much to take  how to take this  when to take this Changed by: Terald Sleeper, PA-C   aspirin 81 MG tablet Take 81 mg by mouth at bedtime.   atorvastatin 10 MG tablet Commonly known as: LIPITOR Take 1 tablet (10 mg total) by mouth daily at 6 PM.   diclofenac sodium 1 % Gel Commonly known as: VOLTAREN   docusate sodium 100 MG capsule Commonly known as: COLACE Take 100 mg by mouth daily.   meclizine 25 MG tablet Commonly known as: ANTIVERT Take 1 tablet (25 mg total) by  mouth 3 (three) times daily as needed for dizziness.   meloxicam 7.5 MG tablet Commonly known as: MOBIC Take 1 tablet (7.5 mg total) by mouth daily as needed for pain. Use 3-4 days Started by: Terald Sleeper, PA-C   pantoprazole 20 MG tablet Commonly known as: PROTONIX Take 1 tablet (20 mg total) by mouth daily.   Vitamin D 125 MCG (5000 UT) Caps Take 5,000 Units by mouth every morning.          Objective:    BP 122/69   Pulse 75   Temp 99.6 F (37.6 C)   Ht 5' (1.524 m)   Wt 97 lb 6.4 oz (44.2 kg)   SpO2 99%   BMI 19.02 kg/m   Allergies  Allergen Reactions  . Dilaudid [Hydromorphone Hcl] Nausea And Vomiting  . Lisinopril Cough  . Valium [Diazepam] Other (See  Comments)    Family states patient has adverse reaction and displays confusion.     Wt Readings from Last 3 Encounters:  04/09/19 97 lb 6.4 oz (44.2 kg)  09/06/18 115 lb 4.8 oz (52.3 kg)  09/04/18 113 lb (51.3 kg)    Physical Exam Constitutional:      General: She is not in acute distress.    Appearance: Normal appearance. She is well-developed.  HENT:     Head: Normocephalic and atraumatic.  Cardiovascular:     Rate and Rhythm: Normal rate.  Pulmonary:     Effort: Pulmonary effort is normal.  Skin:    General: Skin is warm and dry.     Findings: No rash.  Neurological:     Mental Status: She is alert and oriented to person, place, and time.     Deep Tendon Reflexes: Reflexes are normal and symmetric.     Results for orders placed or performed in visit on 04/09/19  Lipid panel  Result Value Ref Range   Cholesterol, Total 190 100 - 199 mg/dL   Triglycerides 66 0 - 149 mg/dL   HDL 88 >39 mg/dL   VLDL Cholesterol Cal 12 5 - 40 mg/dL   LDL Chol Calc (NIH) 90 0 - 99 mg/dL   Chol/HDL Ratio 2.2 0.0 - 4.4 ratio  CBC with Differential/Platelet  Result Value Ref Range   WBC 5.5 3.4 - 10.8 x10E3/uL   RBC 3.99 3.77 - 5.28 x10E6/uL   Hemoglobin 11.2 11.1 - 15.9 g/dL   Hematocrit 35.8 34.0 - 46.6 %   MCV 90 79 - 97 fL   MCH 28.1 26.6 - 33.0 pg   MCHC 31.3 (L) 31.5 - 35.7 g/dL   RDW 14.1 11.7 - 15.4 %   Platelets 289 150 - 450 x10E3/uL   Neutrophils 64 Not Estab. %   Lymphs 27 Not Estab. %   Monocytes 8 Not Estab. %   Eos 1 Not Estab. %   Basos 0 Not Estab. %   Neutrophils Absolute 3.5 1.4 - 7.0 x10E3/uL   Lymphocytes Absolute 1.5 0.7 - 3.1 x10E3/uL   Monocytes Absolute 0.5 0.1 - 0.9 x10E3/uL   EOS (ABSOLUTE) 0.1 0.0 - 0.4 x10E3/uL   Basophils Absolute 0.0 0.0 - 0.2 x10E3/uL   Immature Granulocytes 0 Not Estab. %   Immature Grans (Abs) 0.0 0.0 - 0.1 x10E3/uL  CMP14+EGFR  Result Value Ref Range   Glucose 99 65 - 99 mg/dL   BUN 21 8 - 27 mg/dL   Creatinine, Ser 1.55 (H)  0.57 - 1.00 mg/dL   GFR calc non Af Amer 32 (  L) >59 mL/min/1.73   GFR calc Af Amer 37 (L) >59 mL/min/1.73   BUN/Creatinine Ratio 14 12 - 28   Sodium 144 134 - 144 mmol/L   Potassium 4.5 3.5 - 5.2 mmol/L   Chloride 107 (H) 96 - 106 mmol/L   CO2 21 20 - 29 mmol/L   Calcium 9.7 8.7 - 10.3 mg/dL   Total Protein 5.9 (L) 6.0 - 8.5 g/dL   Albumin 4.2 3.7 - 4.7 g/dL   Globulin, Total 1.7 1.5 - 4.5 g/dL   Albumin/Globulin Ratio 2.5 (H) 1.2 - 2.2   Bilirubin Total 0.4 0.0 - 1.2 mg/dL   Alkaline Phosphatase 105 39 - 117 IU/L   AST 10 0 - 40 IU/L   ALT 3 0 - 32 IU/L      Assessment & Plan:   1. Chronic right-sided low back pain without sciatica - meloxicam (MOBIC) 7.5 MG tablet; Take 1 tablet (7.5 mg total) by mouth daily as needed for pain. Use 3-4 days  Dispense: 30 tablet; Refill: 0  2. GAD (generalized anxiety disorder) - ALPRAZolam (XANAX) 0.5 MG tablet; Take 1 tablet (0.5 mg total) by mouth at bedtime.  Dispense: 30 tablet; Refill: 5  3. Gastro-esophageal reflux disease without esophagitis - pantoprazole (PROTONIX) 20 MG tablet; Take 1 tablet (20 mg total) by mouth daily.  Dispense: 90 tablet; Refill: 3  4. Hypotension, unspecified hypotension type - Lipid panel - CBC with Differential/Platelet - CMP14+EGFR  5. Cerebrovascular accident (CVA), unspecified mechanism ([place]) - Lipid panel - CBC with Differential/Platelet - CMP14+EGFR   Continue all other maintenance medications as listed above.  Follow up plan: Return in about 6 months (around 10/10/2019).  Educational handout given for [place] PA-C [place] [address]  [place], [place] [address] [phone]   04/12/2019, 5:19 PM

## 2019-05-02 ENCOUNTER — Telehealth: Payer: Self-pay | Admitting: Physician Assistant

## 2019-05-02 NOTE — Chronic Care Management (AMB) (Signed)
  Chronic Care Management   Note  05/02/2019 Name: Darienne Belleau MRN: [id number] DOB: 1940-11-24  Miosha Behe is a 79 y.o. year old female who is a primary care patient of Terald Sleeper, PA-C. I reached out to Reuel Derby by phone today in response to a referral sent by Ms. Trinidad Curet Kirchoff's health plan.     Ms. Stankowski was given information about Chronic Care Management services today including:  1. CCM service includes personalized support from designated clinical staff supervised by her physician, including individualized plan of care and coordination with other care providers 2. 24/7 contact phone numbers for assistance for urgent and routine care needs. 3. Service will only be billed when office clinical staff spend 20 minutes or more in a month to coordinate care. 4. Only one practitioner may furnish and bill the service in a calendar month. 5. The patient may stop CCM services at any time (effective at the end of the month) by phone call to the office staff. 6. The patient will be responsible for cost sharing (co-pay) of up to 20% of the service fee (after annual deductible is met).  Patient agreed to services and verbal consent obtained.   Follow up plan: Telephone appointment with care management team member scheduled for: 11/12/2019.  [address], [place] [id number] Direct Dial: [phone] [email or web address]: [email or web address]

## 2019-05-12 DIAGNOSIS — Z87891 Personal history of nicotine dependence: Secondary | ICD-10-CM | POA: Diagnosis not present

## 2019-05-12 DIAGNOSIS — J208 Acute bronchitis due to other specified organisms: Secondary | ICD-10-CM | POA: Diagnosis not present

## 2019-05-12 DIAGNOSIS — R0602 Shortness of breath: Secondary | ICD-10-CM | POA: Diagnosis not present

## 2019-05-12 DIAGNOSIS — Z79899 Other long term (current) drug therapy: Secondary | ICD-10-CM | POA: Diagnosis not present

## 2019-05-12 DIAGNOSIS — E785 Hyperlipidemia, unspecified: Secondary | ICD-10-CM | POA: Diagnosis not present

## 2019-05-12 DIAGNOSIS — Z7982 Long term (current) use of aspirin: Secondary | ICD-10-CM | POA: Diagnosis not present

## 2019-05-12 DIAGNOSIS — Z20822 Contact with and (suspected) exposure to covid-19: Secondary | ICD-10-CM | POA: Diagnosis not present

## 2019-05-12 DIAGNOSIS — R9431 Abnormal electrocardiogram [ECG] [EKG]: Secondary | ICD-10-CM | POA: Diagnosis not present

## 2019-05-12 DIAGNOSIS — Z888 Allergy status to other drugs, medicaments and biological substances status: Secondary | ICD-10-CM | POA: Diagnosis not present

## 2019-05-12 DIAGNOSIS — F329 Major depressive disorder, single episode, unspecified: Secondary | ICD-10-CM | POA: Diagnosis not present

## 2019-05-12 DIAGNOSIS — Z86718 Personal history of other venous thrombosis and embolism: Secondary | ICD-10-CM | POA: Diagnosis not present

## 2019-05-12 DIAGNOSIS — Z885 Allergy status to narcotic agent status: Secondary | ICD-10-CM | POA: Diagnosis not present

## 2019-05-12 DIAGNOSIS — R05 Cough: Secondary | ICD-10-CM | POA: Diagnosis not present

## 2019-05-12 DIAGNOSIS — J209 Acute bronchitis, unspecified: Secondary | ICD-10-CM | POA: Diagnosis not present

## 2019-05-14 ENCOUNTER — Telehealth: Payer: Self-pay | Admitting: *Deleted

## 2019-05-14 NOTE — Telephone Encounter (Signed)
TRANSITIONAL CARE MANAGEMENT TELEPHONE OUTREACH NOTE   Contact Date: 05/14/2019 Contacted By: Antonietta Barcelona, LPN   DISCHARGE INFORMATION Date of Discharge:05/12/19 Discharge Facility: [facility] Principal Discharge Diagnosis:Acute bacterial bronchitis   Outpatient Follow Up Recommendations (copied from discharge summary) Not available  Robin [name] is a female primary care patient of Theodoro Clock. An outgoing telephone call was made today and I spoke with patient.  Robin [name] condition(s) and treatment(s) were discussed. An opportunity to ask questions was provided and all were answered or forwarded as appropriate.    ACTIVITIES OF DAILY LIVING  Robin [name] lives alone and Robin [name] can perform ADLs independently. Robin [name] primary caregiver is Robin [name], Robin [name] daughter Robin [name] is currently staying with Robin [name]. Robin [name] is able to depend on Robin [name] primary caregiver(s) for consistent help. Transportation to appointments, to pick up medications, and to run errands is not a problem.  (Consider referral to [place] if transportation or a consistent caregiver is a problem)   Fall Risk Fall Risk  04/09/2019 09/27/2018  Falls in the past year? 1 0  Comment - -  Number falls in past yr: 0 -  Injury with Fall? 0 -  Comment - -  Risk Factor Category  - -  Risk for fall due to : Impaired balance/gait;History of fall(s) -  Follow up - -    low [place] Modifications/Assistive Devices Wheelchair: Yes does not use has one if Robin [name] ever needs it Cane: Yes has one but is not currently using Ramp: No Bedside Toilet: No Hospital Bed:  No Other: has a walking which Robin [name] is currently not using   [place] Robin [name] is not receiving home health services.    MEDICATION RECONCILIATION  Robin [name] has been able to pick-up all prescribed discharge medications from the pharmacy.   A post discharge medication reconciliation was performed and the complete medication list was  reviewed with the patient/caregiver and is current as of 05/14/2019. Changes highlighted below. Meds receonciled from [facility] Albuterol (Ventolin HFA) 108 (90 base) mcg/act 2 puffs Q6 prn for wheezing Doxycycline 100 mg 1 QD for 7 days Prenisone 20 mg 2 QD for 5 days  Discontinued Medications   Current Medication List Allergies as of 05/14/2019      Reactions   Dilaudid [hydromorphone Hcl] Nausea And Vomiting   Lisinopril Cough   Valium [diazepam] Other (See Comments)   Family states patient has adverse reaction and displays confusion.       Medication List       Accurate as of May 14, 2019 11:06 AM. If you have any questions, ask your nurse or doctor.        albuterol 108 (90 Base) MCG/ACT inhaler Commonly known as: VENTOLIN HFA Inhale 2 puffs into the lungs every 6 (six) hours as needed for wheezing or shortness of breath.   albuterol 108 (90 Base) MCG/ACT inhaler Commonly known as: VENTOLIN HFA Inhale 2 puffs into the lungs every 6 (six) hours as needed. For wheezing   ALPRAZolam 0.5 MG tablet Commonly known as: XANAX Take 1 tablet (0.5 mg total) by mouth at bedtime.   aspirin 81 MG tablet Take 81 mg by mouth at bedtime.   atorvastatin 10 MG tablet Commonly known as: LIPITOR Take 1 tablet (10 mg total) by mouth daily at 6 PM.   diclofenac sodium 1 % Gel Commonly known as: VOLTAREN   docusate sodium 100 MG capsule Commonly known as: COLACE Take  100 mg by mouth daily.   doxycycline 100 MG capsule Commonly known as: VIBRAMYCIN Take 1 capsule by mouth in the morning and at bedtime. For 7 days   meclizine 25 MG tablet Commonly known as: ANTIVERT Take 1 tablet (25 mg total) by mouth 3 (three) times daily as needed for dizziness.   meloxicam 7.5 MG tablet Commonly known as: MOBIC Take 1 tablet (7.5 mg total) by mouth daily as needed for pain. Use 3-4 days   pantoprazole 20 MG tablet Commonly known as: PROTONIX Take 1 tablet (20 mg total) by mouth daily.    predniSONE 20 MG tablet Commonly known as: DELTASONE Take 2 tablets by mouth daily. For 5 days   Vitamin D 125 MCG (5000 UT) Caps Take 5,000 Units by mouth every morning.        PATIENT EDUCATION & FOLLOW-UP PLAN  An appointment for Transitional Care Management is scheduled with Evelina Dun, FNP on Monday 05/21/19 at 11:55 am.  Take all medications as prescribed  Contact our office by calling [phone] if you have any questions or concerns

## 2019-05-21 ENCOUNTER — Other Ambulatory Visit: Payer: Medicare Other

## 2019-05-21 ENCOUNTER — Ambulatory Visit: Payer: Medicare Other | Admitting: Family

## 2019-05-31 ENCOUNTER — Ambulatory Visit (INDEPENDENT_AMBULATORY_CARE_PROVIDER_SITE_OTHER): Payer: Medicare Other | Admitting: Family

## 2019-05-31 ENCOUNTER — Ambulatory Visit: Payer: Medicare Other

## 2019-05-31 ENCOUNTER — Encounter: Payer: Self-pay | Admitting: Family

## 2019-05-31 ENCOUNTER — Other Ambulatory Visit: Payer: Self-pay

## 2019-05-31 VITALS — BP 165/69 | HR 61 | Temp 97.2°F | Ht 60.0 in | Wt 105.4 lb

## 2019-05-31 DIAGNOSIS — G8929 Other chronic pain: Secondary | ICD-10-CM | POA: Diagnosis not present

## 2019-05-31 DIAGNOSIS — M545 Low back pain, unspecified: Secondary | ICD-10-CM

## 2019-05-31 DIAGNOSIS — Z09 Encounter for follow-up examination after completed treatment for conditions other than malignant neoplasm: Secondary | ICD-10-CM

## 2019-05-31 DIAGNOSIS — M25551 Pain in right hip: Secondary | ICD-10-CM | POA: Diagnosis not present

## 2019-05-31 DIAGNOSIS — I6930 Unspecified sequelae of cerebral infarction: Secondary | ICD-10-CM | POA: Diagnosis not present

## 2019-05-31 DIAGNOSIS — J208 Acute bronchitis due to other specified organisms: Secondary | ICD-10-CM | POA: Diagnosis not present

## 2019-05-31 DIAGNOSIS — B9689 Other specified bacterial agents as the cause of diseases classified elsewhere: Secondary | ICD-10-CM

## 2019-05-31 MED ORDER — MELOXICAM 7.5 MG PO TABS: 7.5000 mg | ORAL_TABLET | Freq: Every day | ORAL | 0 refills | Status: DC | PRN

## 2019-05-31 NOTE — Progress Notes (Signed)
Subjective:    Patient ID: Robin [name], female    DOB: 03/04/1940, 79 y.o.   MRN: [id number]  Chief Complaint  Patient presents with  . Hospitalization Follow-up   PT presents to the office today for hospital follow up. She went to the ED on 05/12/19 with SOB and coughing. Her Flu, RSV, and COVID was negative. Her chest x-ray was negative for acute infiltrate.   She was diagnosed with bacterial bronchitis and discharged home on doxycycline, albuterol inhaler, and prednisone. She has completed these. She reports she is feeling much better.  However she is complaining of right hip pain.   Hip Pain  The incident occurred 5 to 7 days ago. The pain is present in the right hip. The quality of the pain is described as aching. The pain is at a severity of 7/10. The pain is moderate. Pertinent negatives include no numbness. She reports no foreign bodies present. The symptoms are aggravated by movement. She has tried acetaminophen for the symptoms. The treatment provided mild relief.      Review of Systems  Neurological: Negative for numbness.  All other systems reviewed and are negative.      Objective:   Physical Exam Vitals reviewed.  Constitutional:      General: She is not in acute distress.    Appearance: She is well-developed.  HENT:     Head: Normocephalic and atraumatic.     Right Ear: External ear normal.  Eyes:     Pupils: Pupils are equal, round, and reactive to light.  Neck:     Thyroid: No thyromegaly.  Cardiovascular:     Rate and Rhythm: Normal rate and regular rhythm.     Heart sounds: Normal heart sounds. No murmur.  Pulmonary:     Effort: Pulmonary effort is normal. No respiratory distress.     Breath sounds: Rhonchi present. No wheezing.  Abdominal:     General: Bowel sounds are normal. There is no distension.     Palpations: Abdomen is soft.     Tenderness: There is no abdominal tenderness.  Musculoskeletal:        General: No tenderness.   Cervical back: Normal range of motion and neck supple.     Right lower leg: Edema (trace) present.     Left lower leg: Edema (trace) present.     Comments: Pain in right hip with internal and external rotation  Skin:    General: Skin is warm and dry.  Neurological:     Mental Status: She is alert and oriented to person, place, and time.     Cranial Nerves: No cranial nerve deficit.     Deep Tendon Reflexes: Reflexes are normal and symmetric.  Psychiatric:        Behavior: Behavior normal.        Thought Content: Thought content normal.        Judgment: Judgment normal.          BP (!) 165/69   Pulse 61   Temp (!) 97.2 F (36.2 C) (Temporal)   Ht 5' (1.524 m)   Wt 105 lb 6.4 oz (47.8 kg)   SpO2 99%   BMI 20.58 kg/m   Assessment & Plan:  Robin [name] comes in today with chief complaint of Hospitalization Follow-up   Diagnosis and orders addressed:  1. Hospital discharge follow-up Hospital notes reviewed   2. Chronic right hip pain -Mobic daily for next 3-4 days No other NSAID's while taking that  3. Acute bacterial bronchitis Rest Greatly improved  Let us know if symptoms worsen or do not improve   Evelina Dun, FNP

## 2019-05-31 NOTE — Patient Instructions (Signed)
Hip Pain The hip is the joint between the upper legs and the lower pelvis. The bones, cartilage, tendons, and muscles of your hip joint support your body and allow you to move around. Hip pain can range from a minor ache to severe pain in one or both of your hips. The pain may be felt on the inside of the hip joint near the groin, or on the outside near the buttocks and upper thigh. You may also have swelling or stiffness in your hip area. Follow these instructions at home: Managing pain, stiffness, and swelling      If directed, put ice on the painful area. To do this: ? Put ice in a plastic bag. ? Place a towel between your skin and the bag. ? Leave the ice on for 20 minutes, 2-3 times a day.  If directed, apply heat to the affected area as often as told by your health care provider. Use the heat source that your health care provider recommends, such as a moist heat pack or a heating pad. ? Place a towel between your skin and the heat source. ? Leave the heat on for 20-30 minutes. ? Remove the heat if your skin turns bright red. This is especially important if you are unable to feel pain, heat, or cold. You may have a greater risk of getting burned. Activity  Do exercises as told by your health care provider.  Avoid activities that cause pain. General instructions   Take over-the-counter and prescription medicines only as told by your health care provider.  Keep a journal of your symptoms. Write down: ? How often you have hip pain. ? The location of your pain. ? What the pain feels like. ? What makes the pain worse.  Sleep with a pillow between your legs on your most comfortable side.  Keep all follow-up visits as told by your health care provider. This is important. Contact a health care provider if:  You cannot put weight on your leg.  Your pain or swelling continues or gets worse after one week.  It gets harder to walk.  You have a fever. Get help right away  if:  You fall.  You have a sudden increase in pain and swelling in your hip.  Your hip is red or swollen or very tender to touch. Summary  Hip pain can range from a minor ache to severe pain in one or both of your hips.  The pain may be felt on the inside of the hip joint near the groin, or on the outside near the buttocks and upper thigh.  Avoid activities that cause pain.  Write down how often you have hip pain, the location of the pain, what makes it worse, and what it feels like. This information is not intended to replace advice given to you by your health care provider. Make sure you discuss any questions you have with your health care provider. Document Revised: 06/12/2018 Document Reviewed: 06/12/2018 Elsevier Patient Education  2020 Elsevier Inc[employer] -- 

## 2019-06-14 ENCOUNTER — Encounter: Payer: Self-pay | Admitting: Family

## 2019-06-14 ENCOUNTER — Ambulatory Visit (INDEPENDENT_AMBULATORY_CARE_PROVIDER_SITE_OTHER): Payer: Medicare Other | Admitting: Family

## 2019-06-14 DIAGNOSIS — M1611 Unilateral primary osteoarthritis, right hip: Secondary | ICD-10-CM | POA: Diagnosis not present

## 2019-06-14 DIAGNOSIS — I6930 Unspecified sequelae of cerebral infarction: Secondary | ICD-10-CM | POA: Diagnosis not present

## 2019-06-14 MED ORDER — HYDROCODONE-ACETAMINOPHEN 5-325 MG PO TABS: 1.0000 | ORAL_TABLET | Freq: Four times a day (QID) | ORAL | 0 refills | Status: DC | PRN

## 2019-06-14 NOTE — Progress Notes (Signed)
   Virtual Visit via telephone Note Due to COVID-19 pandemic this visit was conducted virtually. This visit type was conducted due to national recommendations for restrictions regarding the COVID-19 Pandemic (e.g. social distancing, sheltering in place) in an effort to limit this patient's exposure and mitigate transmission in our community. All issues noted in this document were discussed and addressed.  A physical exam was not performed with this format.  I connected with Robin [name] on 06/14/19 at 2:38 pm  by telephone and verified that I am speaking with the correct person using two identifiers. Robin [name] is currently located at home and no one is currently with her during visit. The provider, Evelina Dun, FNP is located in their office at time of visit.  I discussed the limitations, risks, security and privacy concerns of performing an evaluation and management service by telephone and the availability of in person appointments. I also discussed with the patient that there may be a patient responsible charge related to this service. The patient expressed understanding and agreed to proceed.   History and Present Illness:  Arthritis Presents for follow-up visit. She complains of pain, stiffness and joint swelling. The symptoms have been worsening. Affected locations include the right hip. Her pain is at a severity of 8/10.      Review of Systems  Musculoskeletal: Positive for arthritis, joint swelling and stiffness.     Observations/Objective: No SOB or distress noted   Assessment and Plan: 1. Osteoarthritis of right hip, unspecified osteoarthritis type Pt has had Norco in the past, I will decrease to 5-325 mg from 10-325 mg. She is going to the beach and will only take if she needs it.  Do not take with xanxa. This is just a one time rx to get her through her vacation.  Rest ROM exercises Keep follow up visit  Pt reviewed in [place] Controlled database -  HYDROcodone-acetaminophen (NORCO) 5-325 MG tablet; Take 1 tablet by mouth every 6 (six) hours as needed for moderate pain.  Dispense: 30 tablet; Refill: 0     I discussed the assessment and treatment plan with the patient. The patient was provided an opportunity to ask questions and all were answered. The patient agreed with the plan and demonstrated an understanding of the instructions.   The patient was advised to call back or seek an in-person evaluation if the symptoms worsen or if the condition fails to improve as anticipated.  The above assessment and management plan was discussed with the patient. The patient verbalized understanding of and has agreed to the management plan. Patient is aware to call the clinic if symptoms persist or worsen. Patient is aware when to return to the clinic for a follow-up visit. Patient educated on when it is appropriate to go to the emergency department.   Time call ended:  2:53 pm  I provided 15 minutes of non-face-to-face time during this encounter.    Evelina Dun, FNP

## 2019-07-09 IMAGING — DX DG CHEST 2V
2 series · 2 of 2 positions shown · non-contrast
Comparison: 01/01/2018

CLINICAL DATA: Cough

EXAM:
CHEST - 2 VIEW

[chest pa]
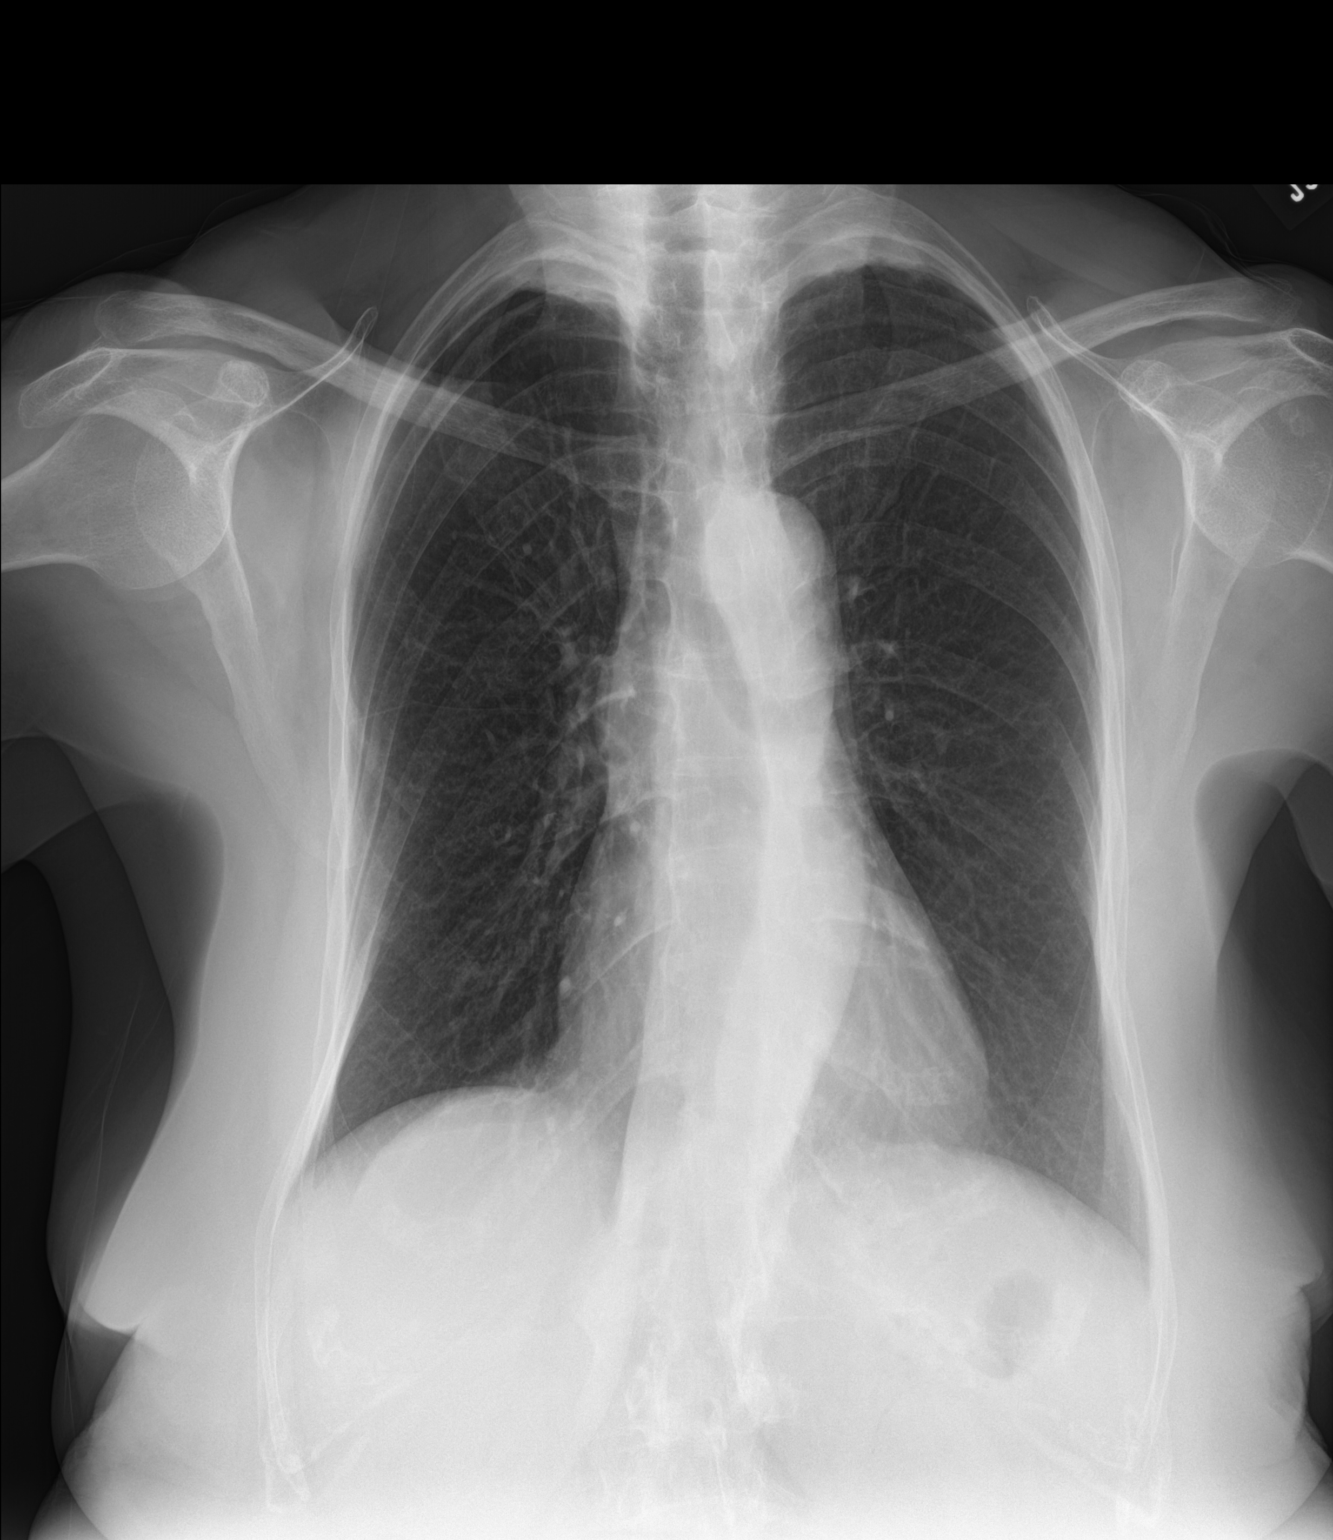

[chest lat]
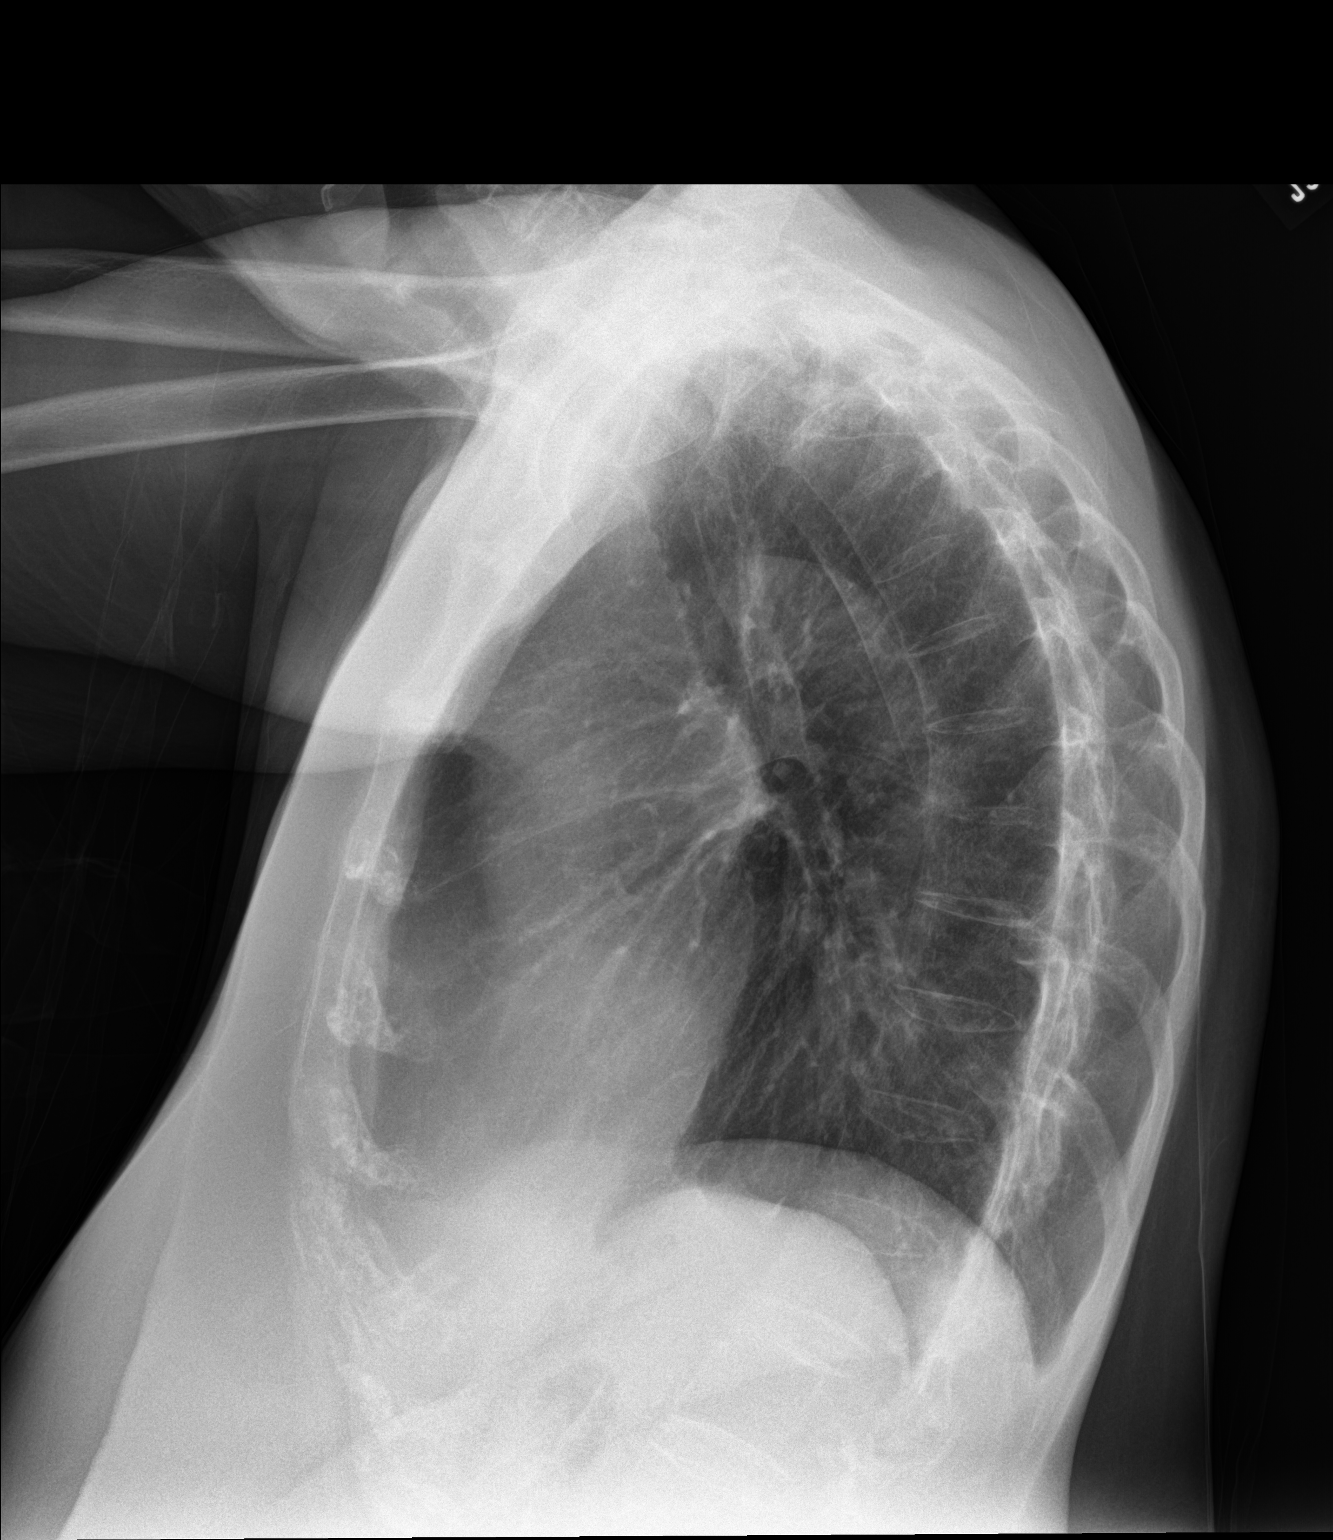

[2 of 2 positions shown; findings below may reference images not displayed]

FINDINGS: Cardiac shadows within normal limits. The lungs are well aerated
bilaterally. No focal infiltrate or sizable effusion is seen. No
acute bony abnormality is noted.
IMPRESSION: No active cardiopulmonary disease.
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
## 2019-07-16 ENCOUNTER — Other Ambulatory Visit: Payer: Self-pay | Admitting: Family

## 2019-07-16 DIAGNOSIS — G8929 Other chronic pain: Secondary | ICD-10-CM

## 2019-07-17 DIAGNOSIS — Z23 Encounter for immunization: Secondary | ICD-10-CM | POA: Diagnosis not present

## 2019-07-23 ENCOUNTER — Other Ambulatory Visit: Payer: Self-pay | Admitting: Family

## 2019-07-23 DIAGNOSIS — M1611 Unilateral primary osteoarthritis, right hip: Secondary | ICD-10-CM

## 2019-08-29 DIAGNOSIS — M436 Torticollis: Secondary | ICD-10-CM | POA: Diagnosis not present

## 2019-09-19 ENCOUNTER — Other Ambulatory Visit: Payer: Self-pay

## 2019-09-19 DIAGNOSIS — M6281 Muscle weakness (generalized): Secondary | ICD-10-CM

## 2019-09-19 DIAGNOSIS — M25551 Pain in right hip: Secondary | ICD-10-CM

## 2019-09-19 DIAGNOSIS — M1612 Unilateral primary osteoarthritis, left hip: Secondary | ICD-10-CM

## 2019-09-19 DIAGNOSIS — M1611 Unilateral primary osteoarthritis, right hip: Secondary | ICD-10-CM

## 2019-09-19 DIAGNOSIS — Z780 Asymptomatic menopausal state: Secondary | ICD-10-CM

## 2019-09-19 DIAGNOSIS — D508 Other iron deficiency anemias: Secondary | ICD-10-CM

## 2019-09-19 DIAGNOSIS — G8929 Other chronic pain: Secondary | ICD-10-CM

## 2019-09-19 DIAGNOSIS — Z9181 History of falling: Secondary | ICD-10-CM

## 2019-10-01 DIAGNOSIS — R0981 Nasal congestion: Secondary | ICD-10-CM | POA: Diagnosis not present

## 2019-10-01 DIAGNOSIS — R519 Headache, unspecified: Secondary | ICD-10-CM | POA: Diagnosis not present

## 2019-10-01 DIAGNOSIS — R05 Cough: Secondary | ICD-10-CM | POA: Diagnosis not present

## 2019-10-06 DIAGNOSIS — Z885 Allergy status to narcotic agent status: Secondary | ICD-10-CM | POA: Diagnosis not present

## 2019-10-06 DIAGNOSIS — Z7982 Long term (current) use of aspirin: Secondary | ICD-10-CM | POA: Diagnosis not present

## 2019-10-06 DIAGNOSIS — K219 Gastro-esophageal reflux disease without esophagitis: Secondary | ICD-10-CM | POA: Diagnosis not present

## 2019-10-06 DIAGNOSIS — B974 Respiratory syncytial virus as the cause of diseases classified elsewhere: Secondary | ICD-10-CM | POA: Diagnosis not present

## 2019-10-06 DIAGNOSIS — Z791 Long term (current) use of non-steroidal anti-inflammatories (NSAID): Secondary | ICD-10-CM | POA: Diagnosis not present

## 2019-10-06 DIAGNOSIS — Z888 Allergy status to other drugs, medicaments and biological substances status: Secondary | ICD-10-CM | POA: Diagnosis not present

## 2019-10-06 DIAGNOSIS — F329 Major depressive disorder, single episode, unspecified: Secondary | ICD-10-CM | POA: Diagnosis not present

## 2019-10-06 DIAGNOSIS — M1990 Unspecified osteoarthritis, unspecified site: Secondary | ICD-10-CM | POA: Diagnosis not present

## 2019-10-06 DIAGNOSIS — Z87442 Personal history of urinary calculi: Secondary | ICD-10-CM | POA: Diagnosis not present

## 2019-10-06 DIAGNOSIS — Z79899 Other long term (current) drug therapy: Secondary | ICD-10-CM | POA: Diagnosis not present

## 2019-10-06 DIAGNOSIS — E785 Hyperlipidemia, unspecified: Secondary | ICD-10-CM | POA: Diagnosis not present

## 2019-10-06 DIAGNOSIS — R0602 Shortness of breath: Secondary | ICD-10-CM | POA: Diagnosis not present

## 2019-10-06 DIAGNOSIS — Z87891 Personal history of nicotine dependence: Secondary | ICD-10-CM | POA: Diagnosis not present

## 2019-10-06 DIAGNOSIS — Z86718 Personal history of other venous thrombosis and embolism: Secondary | ICD-10-CM | POA: Diagnosis not present

## 2019-11-12 ENCOUNTER — Telehealth: Payer: Medicare Other | Admitting: *Deleted

## 2019-11-15 ENCOUNTER — Other Ambulatory Visit: Payer: Self-pay | Admitting: *Deleted

## 2019-11-15 DIAGNOSIS — F411 Generalized anxiety disorder: Secondary | ICD-10-CM

## 2019-12-12 DIAGNOSIS — H6122 Impacted cerumen, left ear: Secondary | ICD-10-CM | POA: Diagnosis not present

## 2019-12-12 DIAGNOSIS — M25551 Pain in right hip: Secondary | ICD-10-CM | POA: Diagnosis not present

## 2019-12-12 DIAGNOSIS — F419 Anxiety disorder, unspecified: Secondary | ICD-10-CM | POA: Diagnosis not present

## 2019-12-12 DIAGNOSIS — H6120 Impacted cerumen, unspecified ear: Secondary | ICD-10-CM | POA: Diagnosis not present

## 2019-12-12 DIAGNOSIS — Z8673 Personal history of transient ischemic attack (TIA), and cerebral infarction without residual deficits: Secondary | ICD-10-CM | POA: Diagnosis not present

## 2021-03-24 ENCOUNTER — Other Ambulatory Visit: Payer: Self-pay

## 2021-03-24 ENCOUNTER — Emergency Department (HOSPITAL_COMMUNITY): Payer: Medicare Other

## 2021-03-24 ENCOUNTER — Observation Stay (HOSPITAL_COMMUNITY)
Admission: EM | Admit: 2021-03-24 | Discharge: 2021-03-25 | Disposition: A | Discharge status: Home / Self Care | Payer: Medicare Other | Attending: Internal Medicine | Admitting: Internal Medicine

## 2021-03-24 ENCOUNTER — Encounter (HOSPITAL_COMMUNITY): Payer: Self-pay | Admitting: Emergency Medicine

## 2021-03-24 DIAGNOSIS — D631 Anemia in chronic kidney disease: Secondary | ICD-10-CM

## 2021-03-24 DIAGNOSIS — E860 Dehydration: Secondary | ICD-10-CM | POA: Insufficient documentation

## 2021-03-24 DIAGNOSIS — K8020 Calculus of gallbladder without cholecystitis without obstruction: Secondary | ICD-10-CM | POA: Diagnosis not present

## 2021-03-24 DIAGNOSIS — N184 Chronic kidney disease, stage 4 (severe): Secondary | ICD-10-CM | POA: Diagnosis not present

## 2021-03-24 DIAGNOSIS — E785 Hyperlipidemia, unspecified: Secondary | ICD-10-CM | POA: Diagnosis present

## 2021-03-24 DIAGNOSIS — Z7982 Long term (current) use of aspirin: Secondary | ICD-10-CM | POA: Insufficient documentation

## 2021-03-24 DIAGNOSIS — R110 Nausea: Secondary | ICD-10-CM

## 2021-03-24 DIAGNOSIS — F0390 Unspecified dementia without behavioral disturbance: Secondary | ICD-10-CM | POA: Insufficient documentation

## 2021-03-24 DIAGNOSIS — D3502 Benign neoplasm of left adrenal gland: Secondary | ICD-10-CM | POA: Diagnosis present

## 2021-03-24 DIAGNOSIS — K219 Gastro-esophageal reflux disease without esophagitis: Secondary | ICD-10-CM | POA: Insufficient documentation

## 2021-03-24 DIAGNOSIS — G43909 Migraine, unspecified, not intractable, without status migrainosus: Secondary | ICD-10-CM | POA: Insufficient documentation

## 2021-03-24 DIAGNOSIS — R112 Nausea with vomiting, unspecified: Secondary | ICD-10-CM | POA: Diagnosis not present

## 2021-03-24 DIAGNOSIS — N179 Acute kidney failure, unspecified: Secondary | ICD-10-CM | POA: Diagnosis not present

## 2021-03-24 DIAGNOSIS — E782 Mixed hyperlipidemia: Secondary | ICD-10-CM | POA: Diagnosis present

## 2021-03-24 DIAGNOSIS — D649 Anemia, unspecified: Secondary | ICD-10-CM

## 2021-03-24 DIAGNOSIS — I129 Hypertensive chronic kidney disease with stage 1 through stage 4 chronic kidney disease, or unspecified chronic kidney disease: Secondary | ICD-10-CM | POA: Insufficient documentation

## 2021-03-24 DIAGNOSIS — Z20822 Contact with and (suspected) exposure to covid-19: Secondary | ICD-10-CM | POA: Diagnosis not present

## 2021-03-24 DIAGNOSIS — Z96641 Presence of right artificial hip joint: Secondary | ICD-10-CM | POA: Insufficient documentation

## 2021-03-24 DIAGNOSIS — D72829 Elevated white blood cell count, unspecified: Secondary | ICD-10-CM

## 2021-03-24 DIAGNOSIS — J020 Streptococcal pharyngitis: Secondary | ICD-10-CM | POA: Insufficient documentation

## 2021-03-24 DIAGNOSIS — Z87891 Personal history of nicotine dependence: Secondary | ICD-10-CM | POA: Insufficient documentation

## 2021-03-24 DIAGNOSIS — Z79899 Other long term (current) drug therapy: Secondary | ICD-10-CM | POA: Insufficient documentation

## 2021-03-24 DIAGNOSIS — F419 Anxiety disorder, unspecified: Secondary | ICD-10-CM | POA: Insufficient documentation

## 2021-03-24 DIAGNOSIS — D539 Nutritional anemia, unspecified: Secondary | ICD-10-CM

## 2021-03-24 LAB — CBC WITH DIFFERENTIAL/PLATELET
Abs Immature Granulocytes: 0.22 10*3/uL — ABNORMAL HIGH (ref 0.00–0.07)
Basophils Absolute: 0.1 10*3/uL (ref 0.0–0.1)
Basophils Relative: 0 %
Eosinophils Absolute: 0.1 10*3/uL (ref 0.0–0.5)
Eosinophils Relative: 0 %
HCT: 28.7 % — ABNORMAL LOW (ref 36.0–46.0)
Hemoglobin: 8.5 g/dL — ABNORMAL LOW (ref 12.0–15.0)
Immature Granulocytes: 1 %
Lymphocytes Relative: 2 %
Lymphs Abs: 0.3 10*3/uL — ABNORMAL LOW (ref 0.7–4.0)
MCH: 29.7 pg (ref 26.0–34.0)
MCHC: 29.6 g/dL — ABNORMAL LOW (ref 30.0–36.0)
MCV: 100.3 fL — ABNORMAL HIGH (ref 80.0–100.0)
Monocytes Absolute: 0.8 10*3/uL (ref 0.1–1.0)
Monocytes Relative: 5 %
Neutro Abs: 15.9 10*3/uL — ABNORMAL HIGH (ref 1.7–7.7)
Neutrophils Relative %: 92 %
Platelets: 274 10*3/uL (ref 150–400)
RBC: 2.86 MIL/uL — ABNORMAL LOW (ref 3.87–5.11)
RDW: 14.9 % (ref 11.5–15.5)
WBC: 17.4 10*3/uL — ABNORMAL HIGH (ref 4.0–10.5)
nRBC: 0 % (ref 0.0–0.2)

## 2021-03-24 LAB — RESP PANEL BY RT-PCR (FLU A&B, COVID) ARPGX2
Influenza A by PCR: NEGATIVE
Influenza B by PCR: NEGATIVE
SARS Coronavirus 2 by RT PCR: NEGATIVE

## 2021-03-24 LAB — BASIC METABOLIC PANEL
Anion gap: 11 (ref 5–15)
BUN: 29 mg/dL — ABNORMAL HIGH (ref 8–23)
CO2: 20 mmol/L — ABNORMAL LOW (ref 22–32)
Calcium: 8.6 mg/dL — ABNORMAL LOW (ref 8.9–10.3)
Chloride: 109 mmol/L (ref 98–111)
Creatinine, Ser: 2.44 mg/dL — ABNORMAL HIGH (ref 0.44–1.00)
GFR, Estimated: 20 mL/min — ABNORMAL LOW (ref >60)
Glucose, Bld: 178 mg/dL — ABNORMAL HIGH (ref 70–99)
Potassium: 4.5 mmol/L (ref 3.5–5.1)
Sodium: 140 mmol/L (ref 135–145)

## 2021-03-24 LAB — URINALYSIS, ROUTINE W REFLEX MICROSCOPIC
Bacteria, UA: NONE SEEN
Bilirubin Urine: NEGATIVE
Glucose, UA: NEGATIVE mg/dL
Ketones, ur: NEGATIVE mg/dL
Leukocytes,Ua: NEGATIVE
Nitrite: NEGATIVE
Protein, ur: 30 mg/dL — AB
Specific Gravity, Urine: 1.012 (ref 1.005–1.030)
pH: 5 (ref 5.0–8.0)

## 2021-03-24 LAB — VITAMIN B12: Vitamin B-12: 235 pg/mL (ref 180–914)

## 2021-03-24 LAB — FOLATE: Folate: 6.5 ng/mL (ref >5.9)

## 2021-03-24 MED ORDER — ACETAMINOPHEN 325 MG PO TABS: 650.0000 mg | ORAL_TABLET | Freq: Four times a day (QID) | ORAL | Status: DC | PRN

## 2021-03-24 MED ORDER — ONDANSETRON HCL 4 MG/2ML IJ SOLN: 4.0000 mg | Freq: Four times a day (QID) | INTRAMUSCULAR | Status: DC | PRN

## 2021-03-24 MED ORDER — ONDANSETRON HCL 4 MG/2ML IJ SOLN
4.0000 mg | Freq: Once | INTRAMUSCULAR | Status: AC
  Administered 2021-03-24: 4 mg via INTRAVENOUS
  Filled 2021-03-24: qty 2

## 2021-03-24 MED ORDER — SODIUM CHLORIDE 0.9 % IV BOLUS
1000.0000 mL | Freq: Once | INTRAVENOUS | Status: AC
  Administered 2021-03-24: 1000 mL via INTRAVENOUS

## 2021-03-24 MED ORDER — SODIUM CHLORIDE 0.9 % IV SOLN: INTRAVENOUS | Status: DC

## 2021-03-24 MED ORDER — PANTOPRAZOLE SODIUM 40 MG IV SOLR
40.0000 mg | INTRAVENOUS | Status: DC
  Administered 2021-03-24 – 2021-03-25 (×2): 40 mg via INTRAVENOUS
  Filled 2021-03-24 (×2): qty 10

## 2021-03-24 MED ORDER — HEPARIN SODIUM (PORCINE) 5000 UNIT/ML IJ SOLN
5000.0000 [IU] | Freq: Three times a day (TID) | INTRAMUSCULAR | Status: DC
  Administered 2021-03-24 – 2021-03-25 (×4): 5000 [IU] via SUBCUTANEOUS
  Filled 2021-03-24 (×4): qty 1

## 2021-03-24 NOTE — ED Triage Notes (Signed)
Pt c/o head/neck pain with emesis for months and has been seen by pcp for the same. Pt was given 4mg  of zofran by ems.

## 2021-03-24 NOTE — Assessment & Plan Note (Addendum)
-  Continue Protonix, dose change to BID -will recommend outpatient follow up with GI service.

## 2021-03-24 NOTE — ED Notes (Signed)
Pt became nauseated after fluid challenge. Did not vomit but spit out some phlegm.

## 2021-03-24 NOTE — ED Notes (Signed)
Pt transported to CT ?

## 2021-03-24 NOTE — Progress Notes (Signed)
°  PROGRESS NOTE  Patient admitted earlier this morning. See H&P.   Patient with history of dementia who presented to the hospital with nausea, vomiting and headache.  She was admitted for possible food poisoning and dehydration.  Patient seen in the emergency department, daughter is at bedside.  She is resting comfortably.  Has not had any vomiting in the emergency department.  Per daughter, several family members had also been sick with nausea and vomiting after eating the same meal.  Continue IV fluids and supportive care for AKI, dehydration Clear liquid diet today and advance as tolerated Likely to be discharged home 2/15 if her vital signs remained stable, lab work improved, able to tolerate diet   Status is: Observation The patient remains OBS appropriate and will d/c before 2 midnights.       Dessa Phi, DO [facility] 03/24/2021, 10:24 AM  Available via Epic secure chat 7am-7pm After these hours, please refer to coverage provider listed on [email or web address]

## 2021-03-24 NOTE — Assessment & Plan Note (Addendum)
-  Incidental of finding of 2.2 cm left adrenal adenoma on CT abdomen and pelvis. -Stable.  -Unknown significance; will recommend outpatient follow-up/monitoring with PCP

## 2021-03-24 NOTE — ED Provider Notes (Signed)
[facility] EMERGENCY DEPARTMENT Provider Note   CSN: [id number] Arrival date & time: 03/24/21  0005     History  Chief Complaint  Patient presents with   Emesis    Robin [name] is a 81 y.o. female.  HPI     This is a 81 year old female with a history of reflux and hypertension who presents with nausea and headache.  Patient reports that she had "a spell" at home.  She describes headache and not being able to walk normally.  She had nausea and vomiting.  She states the nausea and vomiting is not new and she has been dealing with that for some time.  She denies ongoing headache but does have persistent nausea.  Denies significant dizziness or lightheadedness.  Has not had any fevers or recent illnesses.  Denies overt abdominal pain or constipation.  Home Medications Prior to Admission medications   Medication Sig Start Date End Date Taking? Authorizing Provider  albuterol (PROVENTIL HFA;VENTOLIN HFA) 108 (90 Base) MCG/ACT inhaler Inhale 2 puffs into the lungs every 6 (six) hours as needed for wheezing or shortness of breath. 02/20/18   Terald Sleeper, PA-C  albuterol (VENTOLIN HFA) 108 (90 Base) MCG/ACT inhaler Inhale 2 puffs into the lungs every 6 (six) hours as needed. For wheezing 05/12/19   [provider]  ALPRAZolam Duanne Moron) 0.5 MG tablet Take 1 tablet (0.5 mg total) by mouth at bedtime. 04/09/19   Terald Sleeper, PA-C  aspirin 81 MG tablet Take 81 mg by mouth at bedtime.     [provider]  atorvastatin (LIPITOR) 10 MG tablet Take 1 tablet (10 mg total) by mouth daily at 6 PM. 11/29/18   Terald Sleeper, PA-C  Cholecalciferol (VITAMIN D) 125 MCG (5000 UT) CAPS Take 5,000 Units by mouth every morning.     [provider]  diclofenac sodium (VOLTAREN) 1 % GEL  09/13/18   [provider]  docusate sodium (COLACE) 100 MG capsule Take 100 mg by mouth daily.    [provider]  HYDROcodone-acetaminophen (NORCO) 5-325 MG tablet Take 1 tablet  by mouth every 6 (six) hours as needed for moderate pain. 06/14/19   Evelina Dun A, FNP  pantoprazole (PROTONIX) 20 MG tablet Take 1 tablet (20 mg total) by mouth daily. 04/09/19   Terald Sleeper, PA-C      Allergies    Dilaudid [hydromorphone hcl], Lisinopril, and Valium [diazepam]    Review of Systems   Review of Systems  Constitutional:  Negative for fever.  Gastrointestinal:  Positive for nausea.  Neurological:  Positive for headaches.  All other systems reviewed and are negative.  Physical Exam Updated Vital Signs BP (!) 119/58 (BP Location: Right Arm)    Pulse 65    Temp 97.7 F (36.5 C) (Oral)    Resp 16    Ht 1.524 m (5')    Wt 47.8 kg    SpO2 95%    BMI 20.58 kg/m  Physical Exam Vitals and nursing note reviewed.  Constitutional:      Appearance: She is well-developed.     Comments: Thin, frail  HENT:     Head: Normocephalic and atraumatic.     Mouth/Throat:     Mouth: Mucous membranes are dry.  Eyes:     Pupils: Pupils are equal, round, and reactive to light.  Cardiovascular:     Rate and Rhythm: Normal rate and regular rhythm.     Heart sounds: Normal heart sounds.  Pulmonary:  Effort: Pulmonary effort is normal. No respiratory distress.     Breath sounds: No wheezing.  Abdominal:     Palpations: Abdomen is soft.     Tenderness: There is no abdominal tenderness.  Musculoskeletal:     Cervical back: Neck supple.  Skin:    General: Skin is warm and dry.  Neurological:     Mental Status: She is alert and oriented to person, place, and time.     Comments: Cranial nerves II through XII intact, 5 out of 5 strength in all 4 extremities, no dysmetria to finger-nose-finger  Psychiatric:        Mood and Affect: Mood normal.    ED Results / Procedures / Treatments   Labs (all labs ordered are listed, but only abnormal results are displayed) Labs Reviewed  CBC WITH DIFFERENTIAL/PLATELET - Abnormal; Notable for the following components:      Result Value   WBC  17.4 (*)    RBC 2.86 (*)    Hemoglobin 8.5 (*)    HCT 28.7 (*)    MCV 100.3 (*)    MCHC 29.6 (*)    Neutro Abs 15.9 (*)    Lymphs Abs 0.3 (*)    Abs Immature Granulocytes 0.22 (*)    All other components within normal limits  BASIC METABOLIC PANEL - Abnormal; Notable for the following components:   CO2 20 (*)    Glucose, Bld 178 (*)    BUN 29 (*)    Creatinine, Ser 2.44 (*)    Calcium 8.6 (*)    GFR, Estimated 20 (*)    All other components within normal limits  URINALYSIS, ROUTINE W REFLEX MICROSCOPIC - Abnormal; Notable for the following components:   Color, Urine STRAW (*)    Hgb urine dipstick SMALL (*)    Protein, ur 30 (*)    All other components within normal limits  RESP PANEL BY RT-PCR (FLU A&B, COVID) [id number]    EKG EKG Interpretation  Date/Time:  Tuesday March 24 2021 01:13:35 EST Ventricular Rate:  67 PR Interval:  131 QRS Duration: 80 QT Interval:  402 QTC Calculation: 425 R Axis:   68 Text Interpretation: Sinus rhythm Consider right atrial enlargement Borderline ST elevation, anterior leads Baseline wander in lead(s) V4 Confirmed by Thayer Jew [phone]) on 03/24/2021 3:17:32 AM  Radiology CT ABDOMEN PELVIS WO CONTRAST  Result Date: 03/24/2021 CLINICAL DATA:  Nausea, vomiting EXAM: CT ABDOMEN AND PELVIS WITHOUT CONTRAST TECHNIQUE: Multidetector CT imaging of the abdomen and pelvis was performed following the standard protocol without IV contrast. RADIATION DOSE REDUCTION: This exam was performed according to the departmental dose-optimization program which includes automated exposure control, adjustment of the mA and/or kV according to patient size and/or use of iterative reconstruction technique. COMPARISON:  None. FINDINGS: Lower chest: No acute abnormality. Hepatobiliary: Gallstones within the gallbladder. No biliary ductal dilatation. No focal hepatic abnormality. Pancreas: No focal abnormality or ductal dilatation. Spleen: No focal abnormality.  Normal  size. Adrenals/Urinary Tract: Low-density left adrenal nodule measures 2.2 cm and 1 Hounsfield units compatible with adenoma. Right adrenal gland unremarkable. No renal or ureteral stones. No hydronephrosis. Urinary bladder unremarkable. Stomach/Bowel: Stomach, large and small bowel grossly unremarkable. Vascular/Lymphatic: Aortic atherosclerosis. No evidence of aneurysm or adenopathy. Reproductive: Uterus and adnexa unremarkable.  No mass. Other: No free fluid or free air. Musculoskeletal: No acute bony abnormality. Prior right hip replacement. IMPRESSION: Cholelithiasis. 2.2 cm left adrenal adenoma. Aortic atherosclerosis. No acute findings. Electronically Signed   By: Lennette Bihari  [place] M.D.   On: 03/24/2021 03:43   CT Head Wo Contrast  Result Date: 03/24/2021 CLINICAL DATA:  Dizziness, persistent/recurrent, cardiac or vascular cause suspected EXAM: CT HEAD WITHOUT CONTRAST TECHNIQUE: Contiguous axial images were obtained from the base of the skull through the vertex without intravenous contrast. RADIATION DOSE REDUCTION: This exam was performed according to the departmental dose-optimization program which includes automated exposure control, adjustment of the mA and/or kV according to patient size and/or use of iterative reconstruction technique. COMPARISON:  09/06/2018 FINDINGS: Brain: There is atrophy and chronic small vessel disease changes. No acute intracranial abnormality. Specifically, no hemorrhage, hydrocephalus, mass lesion, acute infarction, or significant intracranial injury. Vascular: No hyperdense vessel or unexpected calcification. Skull: No acute calvarial abnormality. Sinuses/Orbits: Mucosal thickening throughout the ethmoid air cells. Air-fluid levels in the maxillary sinuses. Mastoid air cells clear. Orbital soft tissues unremarkable. Other: None IMPRESSION: Atrophy, chronic microvascular disease. No acute intracranial abnormality. Acute on chronic sinusitis. Electronically Signed   By: Rolm Baptise M.D.   On: 03/24/2021 01:11   DG Chest Portable 1 View  Result Date: 03/24/2021 CLINICAL DATA:  Leukocytosis EXAM: PORTABLE CHEST 1 VIEW COMPARISON:  02/27/2018 FINDINGS: There is hyperinflation of the lungs compatible with COPD. Biapical scarring. Heart is borderline in size. No confluent opacities or effusions. No acute bony abnormality. IMPRESSION: COPD/chronic changes.  No active disease. Electronically Signed   By: Rolm Baptise M.D.   On: 03/24/2021 02:22    Procedures Procedures    Medications Ordered in ED Medications  ondansetron (ZOFRAN) injection 4 mg (has no administration in time range)  ondansetron (ZOFRAN) injection 4 mg (4 mg Intravenous Given 03/24/21 0132)  sodium chloride 0.9 % bolus 1,000 mL (0 mLs Intravenous Stopped 03/24/21 0243)    ED Course/ Medical Decision Making/ A&P Clinical Course as of 03/24/21 0553  Tue Mar 24, 2021  0430 Daughter is at the bedside.  States that she has had multiple episodes of vomiting.  There are family members at home with similar symptoms and they think that is something they ate. [CH]  [phone] Patient with very unsteady gait.  COVID and influenza testing still pending.  She remains nauseous.  Given her AKI, will plan for admission to the hospitalist. [CH]    Clinical Course User Index [CH] Cinde Ebert, Barbette Hair, MD                           Medical Decision Making Amount and/or Complexity of Data Reviewed Labs: ordered. Radiology: ordered.  Risk Prescription drug management.   This patient presents to the ED for concern of nausea, headache, this involves an extensive number of treatment options, and is a complaint that carries with it a high risk of complications and morbidity.  The differential diagnosis includes acute infection, head bleed, TIA, dehydration  MDM:    This is a 81 year old female who presents with an episode of vomiting and headache from home.  She is elderly and chronically ill-appearing.  Initial vital signs  notable for temperature of 97.5 and a blood pressure of 96/44.  She is quite thin.  Patient was given a liter of fluids and Zofran.  Labs obtained and notable for leukocytosis with a left shift.  Patient also has creatinine of 2.44 this is up from baseline representing acute kidney injury.  May be related to dehydration.  Given leukocytosis, urinalysis and chest x-ray were obtained.  No obvious UTI.  Chest x-ray without any obvious pneumonia.  COVID and influenza testing are pending.  CT head shows no evidence of bleed.  Given nausea, CT scan of the abdomen was obtained.  No obvious acute finding.  She does have cholelithiasis but that does not correlate with her current symptoms.  After hydration, patient remained persistently nauseous and vomiting.  She is unsteady on her feet.  For this reason we will plan for admission to the hospitalist.  No indication for empiric antibiotics at this time.  Maintenance fluids ordered. (Labs, imaging)  Labs: I Ordered, and personally interpreted labs.  The pertinent results include: Leukocytosis, creatinine of 2.44  Imaging Studies ordered: I ordered imaging studies including chest x-ray, CT head, CT abdomen I independently visualized and interpreted imaging. I agree with the radiologist interpretation  Additional history obtained from daughter.  External records from outside source obtained and reviewed including prior visits  Critical Interventions: IV fluids, IV Zofran  Consultations: I requested consultation with the hospitalist,  and discussed lab and imaging findings as well as pertinent plan - they recommend: Admit  Cardiac Monitoring: The patient was maintained on a cardiac monitor.  I personally viewed and interpreted the cardiac monitored which showed an underlying rhythm of: Normal sinus rhythm  Reevaluation: After the interventions noted above, I reevaluated the patient and found that they have :stayed the same   Considered admission for:  Dehydration, AKI  Social Determinants of Health: Dementia  Disposition: Admit  Co morbidities that complicate the patient evaluation  Past Medical History:  Diagnosis Date   Arthritis    Hypercholesteremia    Hypertension    Pneumonia    Reflux      Medicines Meds ordered this encounter  Medications   ondansetron (ZOFRAN) injection 4 mg   sodium chloride 0.9 % bolus 1,000 mL   ondansetron (ZOFRAN) injection 4 mg    I have reviewed the patients home medicines and have made adjustments as needed  Problem List / ED Course: Problem List Items Addressed This Visit   None Visit Diagnoses     AKI (acute kidney injury) ([place])    -  Primary   Dehydration       Nausea                       Final Clinical Impression(s) / ED Diagnoses Final diagnoses:  AKI (acute kidney injury) ([place])  Dehydration  Nausea    Rx / DC Orders ED Discharge Orders     None         Merryl Hacker, MD 03/24/21 [phone]

## 2021-03-24 NOTE — Assessment & Plan Note (Addendum)
-  WBC 17.4, there is no obvious sign of any acute infectious process at this time. -Abdomen and pelvis, CT head without contrast and chest x-ray showed no acute findings; No fever, no chills. -This was probably due to a reactive process -Repeat CBC at follow-up visit.

## 2021-03-24 NOTE — Assessment & Plan Note (Addendum)
-  Continue Lipitor °

## 2021-03-24 NOTE — Assessment & Plan Note (Addendum)
-  BUN/creatinine 29/2.44 at time of admission  -Cr 1.5-2.66 as per records review -Cr 2.25 -Continue to avoid nephrotoxic agents and maintain adequate hydration -Repeat basic metabolic panel follow-up visit to assess renal function and stability. 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

## 2021-03-24 NOTE — ED Notes (Addendum)
Pt ambulated and complains of nausea. Continues to spit out phlegm.

## 2021-03-24 NOTE — Assessment & Plan Note (Addendum)
-  Maintain adequate hydration -Excellent response to fluid resuscitation -No nausea or vomiting at discharge.   -Patient was tolerating diet.

## 2021-03-24 NOTE — TOC Progression Note (Signed)
°  Transition of Care [facility]) Screening Note   Patient Details  Name: Sanaz Scarlett Date of Birth: [birth year]   Transition of Care [facility]) CM/SW Contact:    Shade Flood, LCSW Phone Number: 03/24/2021, 10:34 AM    Transition of Care Department [facility]) has reviewed patient and no TOC needs have been identified at this time. We will continue to monitor patient advancement through interdisciplinary progression rounds. If new patient transition needs arise, please place a TOC consult.

## 2021-03-24 NOTE — Assessment & Plan Note (Addendum)
-  Hemoglobin down to 6.7 -MCV 100.3 -B12 borderline normal; repletion has been started. -Patient also with component of anemia of chronic kidney disease; 1 unit PRBCs was transfused during this admission -At discharge hemoglobin 9.7.  -Repeat CBC at follow-up visit to assess hemoglobin and stability and trend -GI screening evaluation recommended. -No overt bleeding described or appreciated throughout hospitalization. -Patient advised to avoid the use of NSAIDs. -Might benefit of IV iron and Epogen therapy.

## 2021-03-24 NOTE — H&P (Signed)
History and Physical    Patient: Robin [name] MVH:[id number] DOB: 1940/12/02 DOA: 03/24/2021 DOS: the patient was seen and examined on 03/24/2021 PCP: Wannetta Sender, FNP  Patient coming from: Home  Chief Complaint:  Chief Complaint  Patient presents with   Emesis    HPI: Robin [name] is a 81 y.o. female with medical history significant of hyperlipidemia, GERD and dementia (not on any medication) who presents to the emergency department due to nausea, vomiting and headache.  Patient was unable to provide history possibly due to underlying dementia, history was obtained from daughter at bedside.  Per daughter, patient suddenly started to have nausea and nonbloody vomiting yesterday in the evening with difficulty in being able to keep anything down.  It was thought to be due to food that she ate, because other family members that ate some food got sick with nausea and vomiting as well.  She denies fever, chills, chest pain, shortness of breath, palpitations, diarrhea or constipation.  Daughter at bedside denies any history of COPD inpatient  ED course: In the emergency department, BP was initially soft at 96/44, this improved slightly to 119/55.  Other vital signs were within normal range.  Work-up in the ED showed leukocytosis, macrocytic anemia, BUN/creatinine 29/2.44 (baseline creatinine at 1.4-1.5) and hyperglycemia.  Urinalysis was unimpressive for UTI. CT abdomen and pelvis without contrast showed no acute findings except for cholelithiasis Chest x-ray showed no active disease CT head without contrast showed no acute intracranial abnormality IV Zofran was given, patient was started on IV hydration.  Hospitalist was asked to admit patient for further evaluation and management.  Review of Systems: As mentioned in the history of present illness. All other systems reviewed and are negative. Past Medical History:  Diagnosis Date   Arthritis    Hypercholesteremia     Hypertension    Pneumonia    Reflux    Past Surgical History:  Procedure Laterality Date   APPENDECTOMY     CATARACT EXTRACTION W/PHACO Left 12/19/2017   Procedure: CATARACT EXTRACTION PHACO AND INTRAOCULAR LENS PLACEMENT LEFT EYE;  Surgeon: Tonny Branch, MD;  Location: [facility];  Service: Ophthalmology;  Laterality: Left;  CDE: 26.71   CATARACT EXTRACTION W/PHACO Right 01/02/2018   Procedure: CATARACT EXTRACTION PHACO AND INTRAOCULAR LENS PLACEMENT (IOC);  Surgeon: Tonny Branch, MD;  Location: [facility];  Service: Ophthalmology;  Laterality: Right;  CDE: 21.87   JOINT REPLACEMENT     TOTAL HIP ARTHROPLASTY Right 2012   TUBAL LIGATION     Social History:  reports that she quit smoking about 43 years ago. Her smoking use included cigarettes. She has a 1.25 pack-year smoking history. She has never used smokeless tobacco. She reports that she does not drink alcohol and does not use drugs.  Allergies  Allergen Reactions   Dilaudid [Hydromorphone Hcl] Nausea And Vomiting   Lisinopril Cough   Valium [Diazepam] Other (See Comments)    Family states patient has adverse reaction and displays confusion.     Family History  Problem Relation Age of Onset   Diabetes Mother    Diabetes Sister    Healthy Daughter    Healthy Son    Cancer Brother    Healthy Daughter     Prior to Admission medications   Medication Sig Start Date End Date Taking? Authorizing Provider  albuterol (PROVENTIL HFA;VENTOLIN HFA) 108 (90 Base) MCG/ACT inhaler Inhale 2 puffs into the lungs every 6 (six) hours as needed for wheezing or shortness of  breath. 02/20/18   Terald Sleeper, PA-C  albuterol (VENTOLIN HFA) 108 (90 Base) MCG/ACT inhaler Inhale 2 puffs into the lungs every 6 (six) hours as needed. For wheezing 05/12/19   [provider]  ALPRAZolam Duanne Moron) 0.5 MG tablet Take 1 tablet (0.5 mg total) by mouth at bedtime. 04/09/19   Terald Sleeper, PA-C  aspirin 81 MG tablet Take 81 mg by mouth at bedtime.     [provider]  atorvastatin (LIPITOR) 10 MG tablet Take 1 tablet (10 mg total) by mouth daily at 6 PM. 11/29/18   Terald Sleeper, PA-C  Cholecalciferol (VITAMIN D) 125 MCG (5000 UT) CAPS Take 5,000 Units by mouth every morning.     [provider]  diclofenac sodium (VOLTAREN) 1 % GEL  09/13/18   [provider]  docusate sodium (COLACE) 100 MG capsule Take 100 mg by mouth daily.    [provider]  HYDROcodone-acetaminophen (NORCO) 5-325 MG tablet Take 1 tablet by mouth every 6 (six) hours as needed for moderate pain. 06/14/19   Evelina Dun A, FNP  pantoprazole (PROTONIX) 20 MG tablet Take 1 tablet (20 mg total) by mouth daily. 04/09/19   Terald Sleeper, PA-C    Physical Exam: Vitals:   03/24/21 0300 03/24/21 0445 03/24/21 0455 03/24/21 0630  BP: (!) 104/50 (!) 119/58 (!) 119/58 (!) 107/46  Pulse: 66 70 65 64  Resp:   16 16  Temp:   97.7 F (36.5 C)   TempSrc:   Oral   SpO2: 99% 97% 95% 97%  Weight:      Height:       General: Elderly female. Awake and alert and oriented x3. Not in any acute distress.  HEENT: NCAT.  PERRLA. EOMI. Sclerae anicteric.  Dry mucosal membranes. Neck: Neck supple without lymphadenopathy. No carotid bruits. No masses palpated.  Cardiovascular: Regular rate with normal S1-S2 sounds. No murmurs, rubs or gallops auscultated. No JVD.  Respiratory: Clear breath sounds.  No accessory muscle use. Abdomen: Soft, nontender, nondistended. Active bowel sounds. No masses or hepatosplenomegaly  Skin: No rashes, lesions, or ulcerations.  Dry, warm to touch. Musculoskeletal:  2+ dorsalis pedis and radial pulses. Good ROM.  No contractures  Psychiatric: Intact judgment and insight.  Mood appropriate to current condition. Neurologic: No focal neurological deficits. Strength is 5/5 x 4.  CN II - XII grossly intact.   Data Reviewed: EKG personally reviewed showed normal sinus rhythm at a rate of 67 bpm  Assessment and Plan: * Nausea and vomiting-  (present on admission) This started after eating a meal, other family members who ate the same meal presented with similar symptoms.   Continue IV Zofran as needed Continue IV hydration Patient will be started on clear liquid diet in the morning with plan to advance diet as  tolerated   AKI (acute kidney injury) ([place]) BUN/creatinine 29/2.44 (baseline creatinine at 1.4-1.5)  Continue gentle hydration Renally adjust medications, avoid nephrotoxic agents/dehydration/hypotension   Leukocytosis WBC 17.4, there is no obvious sign of any acute infectious process at this time CT abdomen and pelvis, CT head without contrast and chest x-ray showed no acute findings No fever, no chills.  This is possibly due to a reactive process Continue to monitor WBC with morning labs  Macrocytic anemia MCV 100.3, folate and vitamin B12 levels will be checked  Dehydration Continue IV hydration  Adenoma of left adrenal gland- (present on admission) Incidental of finding of 2.2 cm left adrenal adenoma on  CT abdomen and pelvis. Stable.  Consider outpatient monitoring with PCP  Hyperlipidemia- (present on admission) Continue Lipitor  Gastro-esophageal reflux disease without esophagitis- (present on admission) Continue Protonix    Advance Care Planning:   Code Status: Full Code   Consults: None  Family Communication: Daughter at bedside (all questions answered to satisfaction)  Severity of Illness: The appropriate patient status for this patient is OBSERVATION. Observation status is judged to be reasonable and necessary in order to provide the required intensity of service to ensure the patient's safety. The patient's presenting symptoms, physical exam findings, and initial radiographic and laboratory data in the context of their medical condition is felt to place them at decreased risk for further clinical deterioration. Furthermore, it is anticipated that the patient will be medically stable for  discharge from the hospital within 2 midnights of admission.   Author: Bernadette Hoit, DO 03/24/2021 7:00 AM  For on call review [email or web address].

## 2021-03-24 NOTE — ED Notes (Signed)
Pt sleeping with daughter in room.

## 2021-03-24 NOTE — Assessment & Plan Note (Addendum)
-  This started after eating a meal, other family members who ate the same meal presented with similar symptoms. -continue PRN antiemetics and maintain adequate hydration -tolerating diet at discharge

## 2021-03-25 ENCOUNTER — Encounter (HOSPITAL_COMMUNITY): Payer: Self-pay | Admitting: Internal Medicine

## 2021-03-25 DIAGNOSIS — E43 Unspecified severe protein-calorie malnutrition: Secondary | ICD-10-CM

## 2021-03-25 DIAGNOSIS — K219 Gastro-esophageal reflux disease without esophagitis: Secondary | ICD-10-CM | POA: Diagnosis not present

## 2021-03-25 DIAGNOSIS — N179 Acute kidney failure, unspecified: Secondary | ICD-10-CM | POA: Diagnosis not present

## 2021-03-25 DIAGNOSIS — D649 Anemia, unspecified: Secondary | ICD-10-CM | POA: Diagnosis not present

## 2021-03-25 DIAGNOSIS — N184 Chronic kidney disease, stage 4 (severe): Secondary | ICD-10-CM

## 2021-03-25 DIAGNOSIS — E860 Dehydration: Secondary | ICD-10-CM | POA: Diagnosis not present

## 2021-03-25 DIAGNOSIS — R110 Nausea: Secondary | ICD-10-CM

## 2021-03-25 LAB — CBC
HCT: 23.6 % — ABNORMAL LOW (ref 36.0–46.0)
Hemoglobin: 6.7 g/dL — CL (ref 12.0–15.0)
MCH: 29.1 pg (ref 26.0–34.0)
MCHC: 28.4 g/dL — ABNORMAL LOW (ref 30.0–36.0)
MCV: 102.6 fL — ABNORMAL HIGH (ref 80.0–100.0)
Platelets: 200 10*3/uL (ref 150–400)
RBC: 2.3 MIL/uL — ABNORMAL LOW (ref 3.87–5.11)
RDW: 14.8 % (ref 11.5–15.5)
WBC: 5.7 10*3/uL (ref 4.0–10.5)
nRBC: 0 % (ref 0.0–0.2)

## 2021-03-25 LAB — BASIC METABOLIC PANEL
Anion gap: 9 (ref 5–15)
BUN: 24 mg/dL — ABNORMAL HIGH (ref 8–23)
CO2: 18 mmol/L — ABNORMAL LOW (ref 22–32)
Calcium: 7.9 mg/dL — ABNORMAL LOW (ref 8.9–10.3)
Chloride: 115 mmol/L — ABNORMAL HIGH (ref 98–111)
Creatinine, Ser: 2.25 mg/dL — ABNORMAL HIGH (ref 0.44–1.00)
GFR, Estimated: 22 mL/min — ABNORMAL LOW (ref >60)
Glucose, Bld: 81 mg/dL (ref 70–99)
Potassium: 3.8 mmol/L (ref 3.5–5.1)
Sodium: 142 mmol/L (ref 135–145)

## 2021-03-25 LAB — HEMOGLOBIN AND HEMATOCRIT, BLOOD
HCT: 31.8 % — ABNORMAL LOW (ref 36.0–46.0)
Hemoglobin: 9.7 g/dL — ABNORMAL LOW (ref 12.0–15.0)

## 2021-03-25 LAB — PREPARE RBC (CROSSMATCH)

## 2021-03-25 LAB — ABO/RH: ABO/RH(D): A POS

## 2021-03-25 MED ORDER — ENSURE ACTIVE HIGH PROTEIN PO LIQD: 237.0000 mL | Freq: Three times a day (TID) | ORAL | Status: DC

## 2021-03-25 MED ORDER — FUROSEMIDE 10 MG/ML IJ SOLN
20.0000 mg | Freq: Once | INTRAMUSCULAR | Status: AC
  Administered 2021-03-25: 20 mg via INTRAVENOUS
  Filled 2021-03-25: qty 2

## 2021-03-25 MED ORDER — ONDANSETRON 8 MG PO TBDP: 8.0000 mg | ORAL_TABLET | Freq: Three times a day (TID) | ORAL | 0 refills | Status: DC | PRN

## 2021-03-25 MED ORDER — SODIUM CHLORIDE 0.9% IV SOLUTION: Freq: Once | INTRAVENOUS | Status: AC

## 2021-03-25 MED ORDER — CYANOCOBALAMIN 1000 MCG/ML IJ SOLN
1000.0000 ug | Freq: Once | INTRAMUSCULAR | Status: AC
  Administered 2021-03-25: 1000 ug via INTRAMUSCULAR
  Filled 2021-03-25: qty 1

## 2021-03-25 MED ORDER — PANTOPRAZOLE SODIUM 40 MG PO TBEC: 40.0000 mg | DELAYED_RELEASE_TABLET | Freq: Two times a day (BID) | ORAL | 1 refills | Status: DC

## 2021-03-25 MED ORDER — PANTOPRAZOLE SODIUM 40 MG PO TBEC
40.0000 mg | DELAYED_RELEASE_TABLET | Freq: Two times a day (BID) | ORAL | Status: DC
  Administered 2021-03-25: 40 mg via ORAL
  Filled 2021-03-25: qty 1

## 2021-03-25 MED ORDER — VITAMIN B-12 1000 MCG PO TABS: 1000.0000 ug | ORAL_TABLET | Freq: Every day | ORAL | 3 refills | Status: DC

## 2021-03-25 NOTE — Progress Notes (Signed)
Pt has discharge orders, discharge teaching given and no further questions at this time. Waiting for pt to finish dinner at this time.

## 2021-03-25 NOTE — Discharge Summary (Signed)
Physician Discharge Summary   Patient: Robin [name] MRN: [id number] DOB: 11/17/1940  Admit date:     03/24/2021  Discharge date: 03/25/21  Discharge Physician: Barton Dubois   PCP: Wannetta Sender, FNP   Recommendations at discharge:  Repeat basic metabolic panel to follow electrolytes and renal function Repeat CBC to follow hemoglobin and WBCs trend/stability  Discharge Diagnoses: Principal Problem:   Nausea Active Problems:   Gastro-esophageal reflux disease without esophagitis   Hyperlipidemia   Acute renal failure superimposed on stage 4 chronic kidney disease (HCC)   Dehydration   Symptomatic anemia   Leukocytosis   Adenoma of left adrenal gland Severe protein calorie malnutrition  Hospital admission narrative: As per H&P written by Dr. Josephine Cables on 03/24/2021. Robin [name] is a 81 y.o. female with medical history significant of hyperlipidemia, GERD and dementia (not on any medication) who presents to the emergency department due to nausea, vomiting and headache.  Patient was unable to provide history possibly due to underlying dementia, history was obtained from daughter at bedside.  Per daughter, patient suddenly started to have nausea and nonbloody vomiting yesterday in the evening with difficulty in being able to keep anything down.  It was thought to be due to food that she ate, because other family members that ate some food got sick with nausea and vomiting as well.  She denies fever, chills, chest pain, shortness of breath, palpitations, diarrhea or constipation.  Daughter at bedside denies any history of COPD.   ED course: In the emergency department, BP was initially soft at 96/44, this improved slightly to 119/55.  Other vital signs were within normal range.  Work-up in the ED showed leukocytosis, macrocytic anemia, BUN/creatinine 29/2.44 (baseline creatinine at 1.4-1.5) and hyperglycemia.  Urinalysis was unimpressive for UTI. CT abdomen and pelvis  without contrast showed no acute findings except for cholelithiasis Chest x-ray showed no active disease CT head without contrast showed no acute intracranial abnormality IV Zofran was given, patient was started on IV hydration.  Hospitalist was asked to admit patient for further evaluation and management.  Assessment and Plan: * Nausea and vomiting -This started after eating a meal, other family members who ate the same meal presented with similar symptoms. -continue PRN antiemetics and maintain adequate hydration -tolerating diet at discharge  Symptomatic anemia -Hemoglobin down to 6.7 -MCV 100.3 -B12 borderline normal; repletion has been started. -Patient also with component of anemia of chronic kidney disease; 1 unit PRBCs was transfused during this admission -At discharge hemoglobin 9.7.  -Repeat CBC at follow-up visit to assess hemoglobin and stability and trend -GI screening evaluation recommended. -No overt bleeding described or appreciated throughout hospitalization. -Patient advised to avoid the use of NSAIDs. -Might benefit of IV iron and Epogen therapy.  Dehydration -Maintain adequate hydration -Excellent response to fluid resuscitation -No nausea or vomiting at discharge.   -Patient was tolerating diet.  Acute renal failure superimposed on stage 4 chronic kidney disease (HCC) -BUN/creatinine 29/2.44 at time of admission  -Cr 1.5-2.66 as per records review -Cr 2.25 -Continue to avoid nephrotoxic agents and maintain adequate hydration -Repeat basic metabolic panel follow-up visit to assess renal function and stability. 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  Adenoma of left adrenal gland- (present on admission) -Incidental of finding of 2.2 cm left adrenal adenoma on CT abdomen and pelvis. -Stable.  -Unknown significance; will recommend outpatient  follow-up/monitoring  with PCP  Severe protein calorie malnutrition -Body mass index is 13.96 kg/m. -Patient encouraged to increase oral intake -Feeding supplements recommended at discharge.  Leukocytosis -WBC 17.4, there is no obvious sign of any acute infectious process at this time. -Abdomen and pelvis, CT head without contrast and chest x-ray showed no acute findings; No fever, no chills. -This was probably due to a reactive process -Repeat CBC at follow-up visit.  Hyperlipidemia- (present on admission) -Continue Lipitor  Gastro-esophageal reflux disease without esophagitis- (present on admission) -Continue Protonix, dose change to BID -will recommend outpatient follow up with GI service.   Consultants: None Procedures performed: See below for x-ray reports. Disposition: Home Diet recommendation:  Regular diet  DISCHARGE MEDICATION: Allergies as of 03/25/2021       Reactions   Dilaudid [hydromorphone Hcl] Nausea And Vomiting   Lisinopril Cough   Valium [diazepam] Other (See Comments)   Family states patient has adverse reaction and displays confusion.         Medication List     STOP taking these medications    ALPRAZolam 0.5 MG tablet Commonly known as: XANAX   atorvastatin 10 MG tablet Commonly known as: LIPITOR   HYDROcodone-acetaminophen 5-325 MG tablet Commonly known as: Norco       TAKE these medications    acetaminophen 325 MG tablet Commonly known as: TYLENOL Take 650 mg by mouth every 6 (six) hours as needed for mild pain or headache.   albuterol 108 (90 Base) MCG/ACT inhaler Commonly known as: VENTOLIN HFA Inhale 2 puffs into the lungs every 6 (six) hours as needed for wheezing or shortness of breath.   Ensure Active High Protein Liqd Take 237 mLs by mouth 3 (three) times daily between meals.   ondansetron 8 MG disintegrating tablet Commonly known as: ZOFRAN-ODT Take 1 tablet (8 mg total) by mouth every 8 (eight) hours as needed for  nausea or vomiting.   pantoprazole 40 MG tablet Commonly known as: PROTONIX Take 1 tablet (40 mg total) by mouth 2 (two) times daily. What changed:  medication strength how much to take when to take this   vitamin B-12 1000 MCG tablet Commonly known as: CYANOCOBALAMIN Take 1 tablet (1,000 mcg total) by mouth daily.        Follow-up Information     Wannetta Sender, FNP. Schedule an appointment as soon as possible for a visit in 10 day(s).   Specialty: Family Medicine Contact information: [place] [facility] [place] [address] [place] [address] [phone]                 Discharge Exam: Danley Danker Weights   03/24/21 0021 03/24/21 1556  Weight: 47.8 kg 36.9 kg   General exam: Alert, awake, and following commands appropriately; per daughter at bedside patient mentation at baseline.  No fever, no overt bleeding, no chest pain, no shortness of breath. Respiratory system: Positive scattered rhonchi it, no using accessory muscles.  Good saturation on room air. Cardiovascular system: Rate controlled, no rubs, no gallops, no JVD. Gastrointestinal system: Abdomen is nondistended, soft and nontender. No organomegaly or masses felt. Normal bowel sounds heard. Central nervous system: Alert and oriented. No focal neurological deficits. Extremities: No cyanosis or clubbing. Skin: No petechiae. Psychiatry: Mood & affect appropriate.    Condition at discharge: stable  The results of significant diagnostics from this hospitalization (including imaging, microbiology, ancillary and laboratory) are listed below for reference.   Imaging Studies: CT ABDOMEN PELVIS  WO CONTRAST  Result Date: 03/24/2021 CLINICAL DATA:  Nausea, vomiting EXAM: CT ABDOMEN AND PELVIS WITHOUT CONTRAST TECHNIQUE: Multidetector CT imaging of the abdomen and pelvis was performed following the standard protocol without IV contrast. RADIATION DOSE REDUCTION: This exam was  performed according to the departmental dose-optimization program which includes automated exposure control, adjustment of the mA and/or kV according to patient size and/or use of iterative reconstruction technique. COMPARISON:  None. FINDINGS: Lower chest: No acute abnormality. Hepatobiliary: Gallstones within the gallbladder. No biliary ductal dilatation. No focal hepatic abnormality. Pancreas: No focal abnormality or ductal dilatation. Spleen: No focal abnormality.  Normal size. Adrenals/Urinary Tract: Low-density left adrenal nodule measures 2.2 cm and 1 Hounsfield units compatible with adenoma. Right adrenal gland unremarkable. No renal or ureteral stones. No hydronephrosis. Urinary bladder unremarkable. Stomach/Bowel: Stomach, large and small bowel grossly unremarkable. Vascular/Lymphatic: Aortic atherosclerosis. No evidence of aneurysm or adenopathy. Reproductive: Uterus and adnexa unremarkable.  No mass. Other: No free fluid or free air. Musculoskeletal: No acute bony abnormality. Prior right hip replacement. IMPRESSION: Cholelithiasis. 2.2 cm left adrenal adenoma. Aortic atherosclerosis. No acute findings. Electronically Signed   By: Rolm Baptise M.D.   On: 03/24/2021 03:43   CT Head Wo Contrast  Result Date: 03/24/2021 CLINICAL DATA:  Dizziness, persistent/recurrent, cardiac or vascular cause suspected EXAM: CT HEAD WITHOUT CONTRAST TECHNIQUE: Contiguous axial images were obtained from the base of the skull through the vertex without intravenous contrast. RADIATION DOSE REDUCTION: This exam was performed according to the departmental dose-optimization program which includes automated exposure control, adjustment of the mA and/or kV according to patient size and/or use of iterative reconstruction technique. COMPARISON:  09/06/2018 FINDINGS: Brain: There is atrophy and chronic small vessel disease changes. No acute intracranial abnormality. Specifically, no hemorrhage, hydrocephalus, mass lesion, acute  infarction, or significant intracranial injury. Vascular: No hyperdense vessel or unexpected calcification. Skull: No acute calvarial abnormality. Sinuses/Orbits: Mucosal thickening throughout the ethmoid air cells. Air-fluid levels in the maxillary sinuses. Mastoid air cells clear. Orbital soft tissues unremarkable. Other: None IMPRESSION: Atrophy, chronic microvascular disease. No acute intracranial abnormality. Acute on chronic sinusitis. Electronically Signed   By: Rolm Baptise M.D.   On: 03/24/2021 01:11   DG Chest Portable 1 View  Result Date: 03/24/2021 CLINICAL DATA:  Leukocytosis EXAM: PORTABLE CHEST 1 VIEW COMPARISON:  02/27/2018 FINDINGS: There is hyperinflation of the lungs compatible with COPD. Biapical scarring. Heart is borderline in size. No confluent opacities or effusions. No acute bony abnormality. IMPRESSION: COPD/chronic changes.  No active disease. Electronically Signed   By: Rolm Baptise M.D.   On: 03/24/2021 02:22    Microbiology: Results for orders placed or performed during the hospital encounter of 03/24/21  Resp Panel by RT-PCR (Flu A&B, Covid) Nasopharyngeal Swab     Status: None   Collection Time: 03/24/21  4:58 AM   Specimen: Nasopharyngeal Swab; Nasopharyngeal(NP) swabs in vial transport medium  Result Value Ref Range Status   SARS Coronavirus 2 by RT PCR NEGATIVE NEGATIVE Final    Comment: (NOTE) SARS-CoV-2 target nucleic acids are NOT DETECTED.  The SARS-CoV-2 RNA is generally detectable in upper respiratory specimens during the acute phase of infection. The lowest concentration of SARS-CoV-2 viral copies this assay can detect is 138 copies/mL. A negative result does not preclude SARS-Cov-2 infection and should not be used as the sole basis for treatment or other patient management decisions. A negative result may occur with  improper specimen collection/handling, submission of specimen other than nasopharyngeal swab, presence of viral  mutation(s) within  the areas targeted by this assay, and inadequate number of viral copies(<138 copies/mL). A negative result must be combined with clinical observations, patient history, and epidemiological information. The expected result is Negative.  Fact Sheet for Patients:  EntrepreneurPulse.com.au  Fact Sheet for Healthcare Providers:  IncredibleEmployment.be  This test is no t yet approved or cleared by the [place] FDA and  has been authorized for detection and/or diagnosis of SARS-CoV-2 by FDA under an Emergency Use Authorization (EUA). This EUA will remain  in effect (meaning this test can be used) for the duration of the COVID-19 declaration under Section 564(b)(1) of the Act, 21 U.S.C.section 360bbb-3(b)(1), unless the authorization is terminated  or revoked sooner.       Influenza A by PCR NEGATIVE NEGATIVE Final   Influenza B by PCR NEGATIVE NEGATIVE Final    Comment: (NOTE) The Xpert Xpress SARS-CoV-2/FLU/RSV plus assay is intended as an aid in the diagnosis of influenza from Nasopharyngeal swab specimens and should not be used as a sole basis for treatment. Nasal washings and aspirates are unacceptable for Xpert Xpress SARS-CoV-2/FLU/RSV testing.  Fact Sheet for Patients: EntrepreneurPulse.com.au  Fact Sheet for Healthcare Providers: IncredibleEmployment.be  This test is not yet approved or cleared by the [place] FDA and has been authorized for detection and/or diagnosis of SARS-CoV-2 by FDA under an Emergency Use Authorization (EUA). This EUA will remain in effect (meaning this test can be used) for the duration of the COVID-19 declaration under Section 564(b)(1) of the Act, 21 U.S.C. section 360bbb-3(b)(1), unless the authorization is terminated or revoked.  Performed at [facility], [address] [place], [place] [address]     Labs: CBC: Recent Labs  Lab 03/24/21 0119  03/25/21 0622 03/25/21 1722  WBC 17.4* 5.7  --   NEUTROABS 15.9*  --   --   HGB 8.5* 6.7* 9.7*  HCT 28.7* 23.6* 31.8*  MCV 100.3* 102.6*  --   PLT 274 200  --    Basic Metabolic Panel: Recent Labs  Lab 03/24/21 0119 03/25/21 0622  NA 140 142  K 4.5 3.8  CL 109 115*  CO2 20* 18*  GLUCOSE 178* 81  BUN 29* 24*  CREATININE 2.44* 2.25*  CALCIUM 8.6* 7.9*    Discharge time spent: greater than 30 minutes.  Signed: Barton Dubois, MD [facility] 03/25/2021

## 2021-03-25 NOTE — TOC Transition Note (Signed)
Transition of Care [facility]) - CM/SW Discharge Note   Patient Details  Name: Robin [name] MRN: [id number] Date of Birth: Jun 28, 1940  Transition of Care [facility]) CM/SW Contact:  Salome Arnt, [place] Phone Number: 03/25/2021, 3:32 PM   Clinical Narrative:  Discussed pt in 3:00 progression rounds. Per MD, pt d/c today and no needs.        Final next level of care: Home/Self Care Barriers to Discharge: Barriers Resolved   Patient Goals and CMS Choice        Discharge Placement                       Discharge Plan and Services                                     Social Determinants of Health (SDOH) Interventions     Readmission Risk Interventions No flowsheet data found.

## 2021-03-25 NOTE — Plan of Care (Signed)

## 2021-03-25 NOTE — Care Management Obs Status (Signed)
[facility] NOTIFICATION   Patient Details  Name: Robin [name] MRN: [id number] Date of Birth: October 14, 1940   Medicare Observation Status Notification Given:  Yes    Tommy Medal 03/25/2021, 3:09 PM

## 2021-03-25 NOTE — Progress Notes (Addendum)
Date and time results received: 03/25/21 0738 (use smartphrase ".now" to insert current time)  Test: hemoglobin  Critical Value: 6.7  Name of Provider Notified: Dr. Dyann Kief   Orders Received? Or Actions Taken?: no new orders at this time.

## 2021-03-25 NOTE — Progress Notes (Signed)
Pt discharged and wheeled down to front entrance by staff to vehicle accompanied by pts daughters.

## 2021-03-26 LAB — BPAM RBC
Blood Product Expiration Date: 202303132359
ISSUE DATE / TIME: 202302151124
Unit Type and Rh: 6200

## 2021-03-26 LAB — TYPE AND SCREEN
ABO/RH(D): A POS
Antibody Screen: NEGATIVE
Unit division: 0

## 2021-09-17 ENCOUNTER — Ambulatory Visit: Payer: Self-pay | Admitting: *Deleted

## 2021-09-17 NOTE — Patient Instructions (Signed)
Robin [name]  At some point during the past 4 years, I have worked with you through the [place] Management Program (CCM) at [facility]. We have not worked together within the past 6 months.   Because you are no longer a current primary care patient at [facility], I am removing myself from your care team.   Please reach out with any questions.   Chong Sicilian, BSN, RN-BC Proofreader Dial: [phone]

## 2021-09-17 NOTE — Chronic Care Management (AMB) (Signed)
  Chronic Care Management   Note  09/17/2021 Name: Robin [name] MRN: [id number] DOB: 1940/07/11  Due to changes in the Chronic Care Management (CCM) Program at [facility] I am reviewing and closing old Care Management Care plans. Since patient is no longer a primary care patient at [facility], I am removing myself as the RN Care Manager from the Care Team and closing any RN Care Management Care Plans.   CCM enrollment status changed to "not enrolled" due to [facility] not being their Primary Care Provider.  Chong Sicilian, BSN, RN-BC Proofreader Dial: [phone]

## 2021-10-27 ENCOUNTER — Encounter: Payer: Self-pay | Admitting: Family Medicine

## 2021-10-27 ENCOUNTER — Ambulatory Visit (INDEPENDENT_AMBULATORY_CARE_PROVIDER_SITE_OTHER): Payer: Medicare Other | Admitting: Family Medicine

## 2021-10-27 ENCOUNTER — Ambulatory Visit (INDEPENDENT_AMBULATORY_CARE_PROVIDER_SITE_OTHER): Payer: Medicare Other

## 2021-10-27 VITALS — BP 125/72 | HR 79 | Temp 97.7°F | Ht 64.0 in | Wt 80.8 lb

## 2021-10-27 DIAGNOSIS — M25531 Pain in right wrist: Secondary | ICD-10-CM

## 2021-10-27 DIAGNOSIS — M19131 Post-traumatic osteoarthritis, right wrist: Secondary | ICD-10-CM | POA: Diagnosis not present

## 2021-10-27 DIAGNOSIS — Z8781 Personal history of (healed) traumatic fracture: Secondary | ICD-10-CM | POA: Diagnosis not present

## 2021-10-27 NOTE — Patient Instructions (Addendum)
Please remember to arrive to your appointment 15 minutes prior to your scheduled slot.  If you are late to future visits I may be unable to accommodate your appointment and you may be asked to reschedule.  Wrist Pain, Adult There are many things that can cause wrist pain. Some common causes include: An injury to the wrist. Using the joint too much. A condition that causes too much pressure to be put on a nerve in the wrist (carpal tunnel syndrome). Wear and tear of the joints that happens as a person gets older (osteoarthritis). A condition that causes swelling and stiffness in the joints (arthritis). Sometimes, the cause of wrist pain is not known. Often, the pain goes away when you follow your doctor's instructions for easing pain at home. This may include resting your wrist, icing your wrist, or using a splint or an elastic wrap for a short time. It is important to tell your doctor if your wrist pain does not go away. Follow these instructions at home: If you have a splint or elastic wrap: Wear the splint or wrap as told by your doctor. Take it off only as told by your doctor. Ask if you can take it off for bathing. Loosen the splint or wrap if your fingers: Tingle. Become numb. Turn cold and blue. Check the skin around the splint or wrap every day. Tell your doctor about any concerns. Keep the splint or wrap clean. If the splint or wrap is not waterproof: Do not let it get wet. Cover it with a watertight covering when you take a bath or shower. Managing pain, stiffness, and swelling  If told, put ice on the painful area. To do this: If you have a removable splint or wrap, take it off as told by your doctor. Put ice in a plastic bag. Place a towel between your skin and the bag or between your splint or wrap and the bag. Leave the ice on for 20 minutes, 2-3 times a day. Move your fingers often. Raise (elevate) the injured area above the level of your heart while you are sitting or  lying down. Activity Rest your wrist as told by your doctor. Return to your normal activities as told by your doctor. Ask your doctor what activities are safe for you. Ask your doctor when it is safe to drive if you have a splint or wrap on your wrist. Do exercises as told by your doctor. General instructions Pay attention to any changes in your symptoms. Take over-the-counter and prescription medicines only as told by your doctor. Keep all follow-up visits as told by your doctor. This is important. Contact a doctor if: You have a sudden, sharp pain in the wrist, hand, or arm that is different or new. The swelling or bruising on your wrist or hand gets worse. Your skin: Becomes red. Gets a rash. Has open sores. Your pain does not get better. Your pain gets worse. You have a fever or chills. Get help right away if: You lose feeling in your fingers or hand. Your fingers turn Frei, very red, or cold and blue. You cannot move your fingers. Summary There are many things that can cause wrist pain. It is important to tell your doctor if your wrist pain does not go away. You may need to wear a splint or a wrap for a short period of time. Return to your normal activities as told by your doctor. Ask your doctor what activities are safe for you. This information  is not intended to replace advice given to you by your health care provider. Make sure you discuss any questions you have with your health care provider. Document Revised: 12/14/2018 Document Reviewed: 12/14/2018 Elsevier Patient Education  [place].

## 2021-10-27 NOTE — Progress Notes (Signed)
Subjective: CC: Right-sided wrist pain PCP: Wannetta Sender, FNP PXT:[id number] Robin [name] is a 81 y.o. female presenting to clinic today for:  1.  Wrist pain Patient is brought to the office by her granddaughter.  She notes that she has been having wrist pain for about 3 weeks now.  No preceding injury.  No falls.  She is right-hand dominant.  She has a history of fracture in this wrist 10 years ago that was repaired in [place].  She denies any sensory changes but notes limited active range of motion and some weakness.  Using Tylenol which has helped some.  Denies any history of osteoporosis or renal disease.   ROS: Per HPI  Allergies  Allergen Reactions   Dilaudid [Hydromorphone Hcl] Nausea And Vomiting   Lisinopril Cough   Valium [Diazepam] Other (See Comments)    Family states patient has adverse reaction and displays confusion.    Past Medical History:  Diagnosis Date   Arthritis    Hypercholesteremia    Hypertension    Pneumonia    Reflux     Current Outpatient Medications:    acetaminophen (TYLENOL) 325 MG tablet, Take 650 mg by mouth every 6 (six) hours as needed for mild pain or headache., Disp: , Rfl:    albuterol (PROVENTIL HFA;VENTOLIN HFA) 108 (90 Base) MCG/ACT inhaler, Inhale 2 puffs into the lungs every 6 (six) hours as needed for wheezing or shortness of breath., Disp: 1 Inhaler, Rfl: 0   Nutritional Supplements (ENSURE ACTIVE HIGH PROTEIN) LIQD, Take 237 mLs by mouth 3 (three) times daily between meals., Disp: , Rfl:    ondansetron (ZOFRAN-ODT) 8 MG disintegrating tablet, Take 1 tablet (8 mg total) by mouth every 8 (eight) hours as needed for nausea or vomiting., Disp: 20 tablet, Rfl: 0   pantoprazole (PROTONIX) 40 MG tablet, Take 1 tablet (40 mg total) by mouth 2 (two) times daily., Disp: 60 tablet, Rfl: 1   vitamin B-12 (CYANOCOBALAMIN) 1000 MCG tablet, Take 1 tablet (1,000 mcg total) by mouth daily., Disp: 30 tablet, Rfl: 3 Social History    Socioeconomic History   Marital status: Widowed    Spouse name: Not on file   Number of children: 4   Years of education: 25   Highest education level: 11th grade  Occupational History   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  Tobacco Use   Smoking status: Former    Packs/day: 0.25    Years: 5.00    Total pack years: 1.25    Types: Cigarettes    Quit date: 06/30/1977    Years since quitting: 44.3   Smokeless tobacco: Never  Vaping Use   Vaping Use: Never used  Substance and Sexual Activity   Alcohol use: No   Drug use: No   Sexual activity: Not Currently  Other Topics Concern   Not on file  Social History Narrative   Not on file   Social Determinants of Health   Financial Resource Strain: Low Risk  (06/30/2017)   Overall Financial Resource Strain (CARDIA)    Difficulty of Paying Living Expenses: Not very hard  Food Insecurity: No Food Insecurity (06/30/2017)   Hunger Vital Sign    Worried About Running Out of Food in the Last Year: Never true    [place] in the Last Year: Never true  Transportation Needs: No Transportation Needs (06/30/2017)   PRAPARE - Transportation    Lack of Transportation (  Medical): No    Lack of Transportation (Non-Medical): No  Physical Activity: Unknown (09/27/2018)   Exercise Vital Sign    Days of Exercise per Week: Not on file    Minutes of Exercise per Session: 60 min  Stress: No Stress Concern Present (06/30/2017)   [place]    Feeling of Stress : Not at all  Social Connections: Moderately Isolated (06/30/2017)   Social Connection and Isolation Panel [NHANES]    Frequency of Communication with Friends and Family: More than three times a week    Frequency of Social Gatherings with Friends and Family: More than three times a week    Attends Religious Services: Never    Marine scientist or Organizations: No    Attends  Archivist Meetings: Never    Marital Status: Widowed  Intimate Partner Violence: Not At Risk (06/30/2017)   Humiliation, Afraid, Rape, and Kick questionnaire    Fear of Current or Ex-Partner: No    Emotionally Abused: No    Physically Abused: No    Sexually Abused: No   Family History  Problem Relation Age of Onset   Diabetes Mother    Diabetes Sister    Healthy Daughter    Healthy Son    Cancer Brother    Healthy Daughter     Objective: Office vital signs reviewed. BP 125/72   Pulse 79   Temp 97.7 F (36.5 C)   Ht '5\' 4"'$  (1.626 m)   Wt 80 lb 12.8 oz (36.7 kg)   SpO2 99%   BMI 13.87 kg/m   Physical Examination:  General: Awake, alert, thin, elderly female, No acute distress HEENT: Normal Extremities: warm, well perfused, No edema, cyanosis or clubbing; +2 pulses bilaterally MSK  Right wrist: Has limited active range of motion.  Can only flex and extend wrist to about 30 degrees.  She has no significant soft tissue swelling, erythema or warmth but does have tenderness palpation along the mid aspect of the dorsum of the wrist.  Radial stylus is prominent in this area.  Able to move all digits Neuro: Light touch sensation is grossly intact.  She has good capillary refill.  DG Wrist Complete Right  Result Date: 10/27/2021 CLINICAL DATA:  Dorsal wrist pain. EXAM: RIGHT WRIST - COMPLETE 3+ VIEW COMPARISON:  None Available. FINDINGS: There is diffuse decreased bone mineralization. Within this limitation, no acute fracture is visualized. There is 3 mm ulnar positive variance. Moderate radiocarpal joint space narrowing with associated likely chronic 4 mm scapholunate interval widening. Severe triscaphe and moderate to severe thumb carpometacarpal joint space narrowing. Moderate second and third PIP joint space narrowing. Tiny likely well corticated chronic ossicle within the distal radioulnar joint space. Mild vascular calcification. IMPRESSION: 1. No definite acute fracture  is seen. 2. 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 3. Likely chronic widening of the scapholunate interval (early scapholunate advanced collapse/"SLAC wrist"). Electronically Signed   By: Yvonne Kendall M.D.   On: 10/27/2021 14:34    Assessment/ Plan: 81 y.o. female   Right wrist pain - Plan: DG Wrist Complete Right, Ambulatory referral to Orthopedic Surgery  Slac (scapholunate advanced collapse) of wrist, right - Plan: Ambulatory referral to Orthopedic Surgery  History of wrist fracture - Plan: DG Wrist Complete Right, Ambulatory referral to Orthopedic Surgery  Voltaren gel sample provided.  Uncertain renal disease based on previous labs but patient  denies this history today.  Patient placed in brace today.  Referral to ortho for further assistance/ treatment.  No orders of the defined types were placed in this encounter.  No orders of the defined types were placed in this encounter.    Janora Norlander, DO [place] [phone]

## 2021-10-29 ENCOUNTER — Emergency Department (HOSPITAL_COMMUNITY)
Admission: EM | Admit: 2021-10-29 | Discharge: 2021-10-29 | Discharge status: Against Medical Advice | Payer: Medicare Other | Attending: Emergency Medicine | Admitting: Emergency Medicine

## 2021-10-29 ENCOUNTER — Encounter (HOSPITAL_COMMUNITY): Payer: Self-pay

## 2021-10-29 ENCOUNTER — Other Ambulatory Visit: Payer: Self-pay

## 2021-10-29 DIAGNOSIS — Z5321 Procedure and treatment not carried out due to patient leaving prior to being seen by health care provider: Secondary | ICD-10-CM | POA: Insufficient documentation

## 2021-10-29 DIAGNOSIS — R059 Cough, unspecified: Secondary | ICD-10-CM | POA: Insufficient documentation

## 2021-10-29 NOTE — ED Triage Notes (Signed)
Pt to er via ems, per ems pt returned from a trip to [place] and was not feeling well, states that she checked herself and was found to have covid, states that she is here today because she feels poorly and weak.  Pt talking in full sentences, resps even and unlabored, pt states that she doesn't know why she is here, states that she has been sick for about a week.

## 2021-10-30 ENCOUNTER — Telehealth: Payer: Self-pay | Admitting: Family Medicine

## 2021-10-30 ENCOUNTER — Encounter: Payer: Self-pay | Admitting: Nurse Practitioner

## 2021-10-30 ENCOUNTER — Ambulatory Visit (INDEPENDENT_AMBULATORY_CARE_PROVIDER_SITE_OTHER): Payer: Medicare Other | Admitting: Nurse Practitioner

## 2021-10-30 DIAGNOSIS — U071 COVID-19: Secondary | ICD-10-CM | POA: Diagnosis not present

## 2021-10-30 MED ORDER — MOLNUPIRAVIR EUA 200MG CAPSULE: 4.0000 | ORAL_CAPSULE | Freq: Two times a day (BID) | ORAL | 0 refills | Status: AC

## 2021-10-30 MED ORDER — MOLNUPIRAVIR EUA 200MG CAPSULE: 4.0000 | ORAL_CAPSULE | Freq: Two times a day (BID) | ORAL | 0 refills | Status: DC

## 2021-10-30 MED ORDER — GUAIFENESIN ER 600 MG PO TB12: 600.0000 mg | ORAL_TABLET | Freq: Two times a day (BID) | ORAL | 0 refills | Status: DC

## 2021-10-30 NOTE — Patient Instructions (Signed)

## 2021-10-30 NOTE — Progress Notes (Signed)
   Virtual Visit  Note Due to COVID-19 pandemic this visit was conducted virtually. This visit type was conducted due to national recommendations for restrictions regarding the COVID-19 Pandemic (e.g. social distancing, sheltering in place) in an effort to limit this patient's exposure and mitigate transmission in our community. All issues noted in this document were discussed and addressed.  A physical exam was not performed with this format.  I connected with Robin [name] on 10/30/21 at 9:20 am  by telephone and verified that I am speaking with the correct person using two identifiers. Robin [name] is currently located at home with care giver present during visit. The provider, Ivy Lynn, NP is located in their office at time of visit.  I discussed the limitations, risks, security and privacy concerns of performing an evaluation and management service by telephone and the availability of in person appointments. I also discussed with the patient that there may be a patient responsible charge related to this service. The patient expressed understanding and agreed to proceed.   History and Present Illness:  HPI    ROS   Observations/Objective: Televisit patient not in distress.  Assessment and Plan: Patient positive for COVID-19  Take meds as prescribed - Use a cool mist humidifier  -Use saline nose sprays frequently -Force fluids -For fever or aches or pains- take Tylenol or ibuprofen. -Monipurivir antiviral Rx sent to pharmacy.  Education provided to patient.  Patient knows to hold all blood thinners and statin while on medication.  Patient and caregiver knows to go to emergency department over the weekend if patient's symptoms are worse or uncontrolled. -If symptoms do not improve, she may need to be COVID tested to rule this out  Follow Up Instructions: Follow-up for worsening unresolved symptoms.    I discussed the assessment and treatment plan with the  patient. The patient was provided an opportunity to ask questions and all were answered. The patient agreed with the plan and demonstrated an understanding of the instructions.   The patient was advised to call back or seek an in-person evaluation if the symptoms worsen or if the condition fails to improve as anticipated.  The above assessment and management plan was discussed with the patient. The patient verbalized understanding of and has agreed to the management plan. Patient is aware to call the clinic if symptoms persist or worsen. Patient is aware when to return to the clinic for a follow-up visit. Patient educated on when it is appropriate to go to the emergency department.   Time call ended: 9:45 AM  I provided 15 minutes of  non face-to-face time during this encounter.    Ivy Lynn, NP

## 2021-10-30 NOTE — Telephone Encounter (Signed)
Lmtcb.

## 2021-10-30 NOTE — Telephone Encounter (Signed)
We already made changes, I sent medication to [facility] in [place], I tried calling patients 2x, Misty also called and was able to notify another patient with the same problem. Thank you.

## 2021-11-17 ENCOUNTER — Ambulatory Visit: Payer: Medicare Other | Admitting: Sports Medicine

## 2021-12-30 ENCOUNTER — Ambulatory Visit (INDEPENDENT_AMBULATORY_CARE_PROVIDER_SITE_OTHER): Payer: Medicare Other | Admitting: Family Medicine

## 2021-12-30 ENCOUNTER — Encounter: Payer: Self-pay | Admitting: Family Medicine

## 2021-12-30 VITALS — BP 127/74 | HR 86 | Temp 97.4°F | Ht 60.0 in | Wt 85.4 lb

## 2021-12-30 DIAGNOSIS — Z862 Personal history of diseases of the blood and blood-forming organs and certain disorders involving the immune mechanism: Secondary | ICD-10-CM | POA: Diagnosis not present

## 2021-12-30 DIAGNOSIS — H9192 Unspecified hearing loss, left ear: Secondary | ICD-10-CM

## 2021-12-30 DIAGNOSIS — Z2821 Immunization not carried out because of patient refusal: Secondary | ICD-10-CM

## 2021-12-30 DIAGNOSIS — M19131 Post-traumatic osteoarthritis, right wrist: Secondary | ICD-10-CM | POA: Diagnosis not present

## 2021-12-30 DIAGNOSIS — R944 Abnormal results of kidney function studies: Secondary | ICD-10-CM | POA: Diagnosis not present

## 2021-12-30 DIAGNOSIS — G3184 Mild cognitive impairment, so stated: Secondary | ICD-10-CM | POA: Diagnosis not present

## 2021-12-30 DIAGNOSIS — E441 Mild protein-calorie malnutrition: Secondary | ICD-10-CM | POA: Insufficient documentation

## 2021-12-30 DIAGNOSIS — Z8673 Personal history of transient ischemic attack (TIA), and cerebral infarction without residual deficits: Secondary | ICD-10-CM

## 2021-12-30 NOTE — Progress Notes (Signed)
Subjective: CC: Establish care PCP: Janora Norlander, DO HOZ:YYQMG Robin [name] is a 81 y.o. female presenting to clinic today for:  1.  Chronic kidney disease Patient with apparent chronic kidney disease stage IIIa.  She had superimposed renal failure back in February of this year.  Her daughter notes that she really did not have good care at her previous office and she is unsure if she is really had any monitoring of the renal function since that hospitalization.  She continues to have urine output normally.  She has not had any syncopal or presyncopal episodes.  No dizziness, chest pain or shortness of breath reported.  No edema.  Continues to have intermittent swelling of that right wrist and unfortunately had COVID-19 to could not go to the orthopedic appointment that was scheduled for her.  Her daughter asks for the appointment number so that she can reschedule this.  2.  Memory issues Daughter has identified some memory issues in this patient and would like to start medication if this could be helpful.  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  She is never seen an audiologist for this but would be interested in it.  Currently not on any medications.  She resides with her daughter and is fairly independent in ADLs except for require some assistance getting in and out of the bathtub.  Utilizes a shower chair.  No incontinence.  No wandering.  No big mood disturbances identified.  Appetite is fairly good.  3.  History of anemia Patient with history of anemia due to GI bleeding.  She was previously treated with Protonix twice daily but has been off of medications for a while now.  Denies any abdominal pain, nausea, vomiting or blood per rectum.   ROS: Per HPI  Allergies  Allergen Reactions   Dilaudid [Hydromorphone Hcl] Nausea And  Vomiting   Lisinopril Cough   Valium [Diazepam] Other (See Comments)    Family states patient has adverse reaction and displays confusion.    Past Medical History:  Diagnosis Date   Arthritis    Hypercholesteremia    Hypertension    Pneumonia    Reflux     Current Outpatient Medications:    acetaminophen (TYLENOL) 325 MG tablet, Take 650 mg by mouth every 6 (six) hours as needed for mild pain or headache. (Patient not taking: Reported on 10/27/2021), Disp: , Rfl:    guaiFENesin (MUCINEX) 600 MG 12 hr tablet, Take 1 tablet (600 mg total) by mouth 2 (two) times daily., Disp: 30 tablet, Rfl: 0   ondansetron (ZOFRAN-ODT) 8 MG disintegrating tablet, Take 1 tablet (8 mg total) by mouth every 8 (eight) hours as needed for nausea or vomiting. (Patient not taking: Reported on 10/27/2021), Disp: 20 tablet, Rfl: 0   pantoprazole (PROTONIX) 40 MG tablet, Take 1 tablet (40 mg total) by mouth 2 (two) times daily. 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  Tobacco Use   Smoking  status: Former    Packs/day: 0.25    Years: 5.00    Total pack years: 1.25    Types: Cigarettes    Quit date: 06/30/1977    Years since quitting: 44.5   Smokeless tobacco: Never  Vaping Use   Vaping Use: Never used  Substance and Sexual Activity   Alcohol use: No   Drug use: No   Sexual activity: Not Currently  Other Topics Concern   Not on file  Social History Narrative   Not on file   Social Determinants of Health   Financial Resource Strain: Low Risk  (06/30/2017)   Overall Financial Resource Strain (CARDIA)    Difficulty of Paying Living Expenses: Not very hard  Food Insecurity: No Food Insecurity  (06/30/2017)   Hunger Vital Sign    Worried About Running Out of Food in the Last Year: Never true    Ran Out of Food in the Last Year: Never true  Transportation Needs: No Transportation Needs (06/30/2017)   PRAPARE - Hydrologist (Medical): No    Lack of Transportation (Non-Medical): No  Physical Activity: Unknown (09/27/2018)   Exercise Vital Sign    Days of Exercise per Week: Not on file    Minutes of Exercise per Session: 60 min  Stress: No Stress Concern Present (06/30/2017)   [place]    Feeling of Stress : Not at all  Social Connections: Moderately Isolated (06/30/2017)   Social Connection and Isolation Panel [NHANES]    Frequency of Communication with Friends and Family: More than three times a week    Frequency of Social Gatherings with Friends and Family: More than three times a week    Attends Religious Services: Never    Marine scientist or Organizations: No    Attends Archivist Meetings: Never    Marital Status: Widowed  Intimate Partner Violence: Not At Risk (06/30/2017)   Humiliation, Afraid, Rape, and Kick questionnaire    Fear of Current or Ex-Partner: No    Emotionally Abused: No    Physically Abused: No    Sexually Abused: No   Family History  Problem Relation Age of Onset   Diabetes Mother    Diabetes Sister    Healthy Daughter    Healthy Son    Cancer Brother    Healthy Daughter     Objective: Office vital signs reviewed. BP 127/74   Pulse 86   Temp (!) 97.4 F (36.3 C)   Ht 5' (1.524 m)   Wt 85 lb 6.4 oz (38.7 kg)   SpO2 99%   BMI 16.68 kg/m   Physical Examination:  General: Awake, alert, thin female, No acute distress HEENT: Sclera Sobolewski.  No conjunctival pallor.  Moist mucous membranes.  Left TM with quite a bit of scarring.  Her external auditory canals are very narrow and this makes exam difficult Cardio: regular rate and rhythm,  S1S2 heard, no murmurs appreciated Pulm: clear to auscultation bilaterally, no wheezes, rhonchi or rales; normal work of breathing on room air GI: soft, non-tender, non-distended, bowel sounds present x4, no hepatomegaly, no splenomegaly, no masses GU: No suprapubic tenderness.  No appreciable uterine or adnexal masses Extremities: warm, well perfused, No edema, cyanosis or clubbing; +2 pulses bilaterally.  Right wrist in brace MSK: Hunched station with slow, slightly shuffled gait Neuro: See MMSE below.  Follows commands.  Engages with the provider.  Difficulty hearing from the  left ear     12/30/2021    2:53 PM 06/30/2017    2:51 PM  MMSE - Mini Mental State Exam  Orientation to time 1 5  Orientation to Place 4 5  Registration 3 3  Attention/ Calculation 5 5  Recall 0 2  Language- name 2 objects 2 2  Language- repeat 1 1  Language- follow 3 step command 3 3  Language- read & follow direction 1 1  Write a sentence 1 1  Copy design 0 1  Total score 21 29    Assessment/ Plan: 81 y.o. female   Slac (scapholunate advanced collapse) of wrist, right - Plan: Ambulatory referral to Orthopedic Surgery  Abnormal renal function test - Plan: CMP14+EGFR  History of anemia - Plan: CBC, Iron, TIBC and Ferritin Panel  Mild cognitive impairment  History of CVA (cerebrovascular accident)  Hearing difficulty, left - Plan: Ambulatory referral to Audiology  Pneumococcal vaccination declined  I gave her information for the orthopedic and place a new referral as her other one was closed for a "no-show" even though there apparently was some type of conversation held with scheduling to let them know that she had no COVID-19 and could not make it  Check renal function as she has history of CKD 3 and last GFR was more consistent with end-stage renal disease.  Discussed possible treatment plans including ARB versus [place] versus [place].  May need patient assistance  Check iron, TIBC, CBC given  history of blood loss anemia.  We did MMSE on her today and this was consistent with mild cognitive impairment.  We discussed consideration for Aricept versus Namenda and both she and her daughter are interested in this.  We will await lab results before proceeding  Referral to audiology for left hearing loss.  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  Uncertain if hearing aids would be beneficial but certainly will refer especially given cognitive findings  Counseled on pneumococcal vaccination and she will reconsider this for next visit  No orders of the defined types were placed in this encounter.  No orders of the defined types were placed in this encounter.    Janora Norlander, DO [place] [phone]

## 2021-12-30 NOTE — Patient Instructions (Signed)
We talked about Pneumonia shot.  Think about this one because I think it's an important one to have.  Suspect she has chronic kidney disease There are meds that can help preserve kidney function:  Losartan (would lower blood pressure so probably wouldn't choose this one) Wilder Glade (make you pee more and can also cause fluctuations in blood pressure) [place] Clamp (brand name but we can probably get this free from manufacturer)  Contact the orthopedist for a wrist appointment

## 2022-01-04 ENCOUNTER — Encounter: Payer: Self-pay | Admitting: Family Medicine

## 2022-01-04 DIAGNOSIS — N184 Chronic kidney disease, stage 4 (severe): Secondary | ICD-10-CM | POA: Insufficient documentation

## 2022-01-06 LAB — HGB A1C W/O EAG: Hgb A1c MFr Bld: 4.8 % (ref 4.8–5.6)

## 2022-01-06 LAB — SPECIMEN STATUS REPORT

## 2022-01-14 ENCOUNTER — Telehealth: Payer: Self-pay | Admitting: Family Medicine

## 2022-01-14 LAB — CBC
Hematocrit: 28.2 % — ABNORMAL LOW (ref 34.0–46.6)
Hemoglobin: 8.8 g/dL — CL (ref 11.1–15.9)
MCH: 29.5 pg (ref 26.6–33.0)
MCHC: 31.2 g/dL — ABNORMAL LOW (ref 31.5–35.7)
MCV: 95 fL (ref 79–97)
Platelets: 308 10*3/uL (ref 150–450)
RBC: 2.98 x10E6/uL — ABNORMAL LOW (ref 3.77–5.28)
RDW: 14.2 % (ref 11.7–15.4)
WBC: 5.3 10*3/uL (ref 3.4–10.8)

## 2022-01-14 LAB — CMP14+EGFR
AST: 11 IU/L (ref 0–40)
Albumin/Globulin Ratio: 2.3 — ABNORMAL HIGH (ref 1.2–2.2)
Albumin: 4.1 g/dL (ref 3.8–4.8)
Alkaline Phosphatase: 108 IU/L (ref 44–121)
BUN/Creatinine Ratio: 13 (ref 12–28)
BUN: 34 mg/dL — ABNORMAL HIGH (ref 8–27)
Bilirubin Total: 0.4 mg/dL (ref 0.0–1.2)
CO2: 18 mmol/L — ABNORMAL LOW (ref 20–29)
Calcium: 9.1 mg/dL (ref 8.7–10.3)
Chloride: 108 mmol/L — ABNORMAL HIGH (ref 96–106)
Creatinine, Ser: 2.55 mg/dL — ABNORMAL HIGH (ref 0.57–1.00)
Globulin, Total: 1.8 g/dL (ref 1.5–4.5)
Glucose: 176 mg/dL — ABNORMAL HIGH (ref 70–99)
Potassium: 4.6 mmol/L (ref 3.5–5.2)
Sodium: 141 mmol/L (ref 134–144)
Total Protein: 5.9 g/dL — ABNORMAL LOW (ref 6.0–8.5)
eGFR: 19 mL/min/{1.73_m2} — ABNORMAL LOW (ref >59)

## 2022-01-14 LAB — IRON,TIBC AND FERRITIN PANEL
Ferritin: 266 ng/mL — ABNORMAL HIGH (ref 15–150)
Iron Saturation: 23 % (ref 15–55)
Iron: 45 ug/dL (ref 27–139)
Total Iron Binding Capacity: 196 ug/dL — ABNORMAL LOW (ref 250–450)
UIBC: 151 ug/dL (ref 118–369)

## 2022-01-14 NOTE — Telephone Encounter (Signed)
No answer unable to leave a message for patient to call back and schedule Medicare Annual Wellness Visit (AWV) to be completed by video or phone.   Last AWV: 09/27/2018   Please schedule at anytime with [place]     45 minute appointment  Any questions, please contact me at [phone]

## 2022-01-15 ENCOUNTER — Encounter: Payer: Self-pay | Admitting: *Deleted

## 2022-01-18 ENCOUNTER — Ambulatory Visit: Payer: Medicare Other | Admitting: Sports Medicine

## 2022-03-22 ENCOUNTER — Emergency Department (HOSPITAL_COMMUNITY): Payer: 59

## 2022-03-22 ENCOUNTER — Encounter (HOSPITAL_COMMUNITY): Payer: Self-pay | Admitting: *Deleted

## 2022-03-22 ENCOUNTER — Emergency Department (HOSPITAL_COMMUNITY)
Admission: EM | Admit: 2022-03-22 | Discharge: 2022-03-22 | Disposition: A | Discharge status: Home / Self Care | Payer: 59 | Attending: Emergency Medicine | Admitting: Emergency Medicine

## 2022-03-22 ENCOUNTER — Other Ambulatory Visit: Payer: Self-pay

## 2022-03-22 DIAGNOSIS — M25552 Pain in left hip: Secondary | ICD-10-CM | POA: Diagnosis not present

## 2022-03-22 DIAGNOSIS — M79662 Pain in left lower leg: Secondary | ICD-10-CM | POA: Insufficient documentation

## 2022-03-22 DIAGNOSIS — M1612 Unilateral primary osteoarthritis, left hip: Secondary | ICD-10-CM | POA: Diagnosis not present

## 2022-03-22 DIAGNOSIS — Z743 Need for continuous supervision: Secondary | ICD-10-CM | POA: Diagnosis not present

## 2022-03-22 DIAGNOSIS — M79605 Pain in left leg: Secondary | ICD-10-CM

## 2022-03-22 DIAGNOSIS — M25562 Pain in left knee: Secondary | ICD-10-CM | POA: Diagnosis not present

## 2022-03-22 MED ORDER — ACETAMINOPHEN 500 MG PO TABS
1000.0000 mg | ORAL_TABLET | Freq: Once | ORAL | Status: AC
  Administered 2022-03-22: 1000 mg via ORAL
  Filled 2022-03-22: qty 2

## 2022-03-22 MED ORDER — LIDOCAINE 5 % EX PTCH
1.0000 | MEDICATED_PATCH | CUTANEOUS | Status: DC
  Administered 2022-03-22: 1 via TRANSDERMAL
  Filled 2022-03-22: qty 1

## 2022-03-22 NOTE — Discharge Instructions (Signed)
You were seen for your leg pain in the emergency department.   At home, please use your walker to help you walk.  Use Tylenol and lidocaine patches over-the-counter for your pain.    Follow-up with your primary doctor in 2-3 days regarding your visit.  Follow-up with orthopedics in 1 week.  Return immediately to the emergency department if you experience any of the following: Worsening pain, fevers or joint swelling, or any other concerning symptoms.    Thank you for visiting our Emergency Department. It was a pleasure taking care of you today.

## 2022-03-22 NOTE — ED Provider Notes (Signed)
[place] EMERGENCY DEPARTMENT AT [facility] Provider Note   CSN: [id number] Arrival date & time: 03/22/22  0741     History  Chief Complaint  Patient presents with   Leg Pain    Rakhee Massarelli is a 82 y.o. female.  82 year old female with a history of arthritis who presents to the emergency department with left lower extremity pain.  History obtained per patient and her daughter.  Per the patient, has had ongoing left lower extremity pain for several months.  Says it worsened recently.  Per her daughter she was walking normally yesterday and then today was refusing to bear weight on her leg.  Does have a walker but does not use it typically.  Says that the pain is worse near her knee and thigh.  Denies any fevers, joint swelling, distal lower extremity pain.  No trauma to the area or injuries.       Home Medications Prior to Admission medications   Not on File      Allergies    Dilaudid [hydromorphone hcl], Lisinopril, and Valium [diazepam]    Review of Systems   Review of Systems  Physical Exam Updated Vital Signs BP (!) 115/55   Pulse 60   Temp 98.4 F (36.9 C)   Resp 18   Ht 5' (1.524 m)   Wt 40.8 kg   SpO2 98%   BMI 17.58 kg/m  Physical Exam Vitals and nursing note reviewed.  Constitutional:      General: She is not in acute distress.    Appearance: She is well-developed.  HENT:     Head: Normocephalic and atraumatic.     Right Ear: External ear normal.     Left Ear: External ear normal.     Nose: Nose normal.  Eyes:     Extraocular Movements: Extraocular movements intact.     Conjunctiva/sclera: Conjunctivae normal.     Pupils: Pupils are equal, round, and reactive to light.  Pulmonary:     Effort: Pulmonary effort is normal. No respiratory distress.  Musculoskeletal:     Cervical back: Normal range of motion and neck supple.     Right lower leg: No edema.     Left lower leg: No edema.     Comments: No joint effusions noted in  hip or left knee.  Full range of motion of both joints.  No warmth or erythema noted.  Mild tenderness to palpation diffusely over left knee and distal femur.  No gross abnormalities noted.  Dorsalis pedis pulses 2+ bilaterally.  Both feet appear warm and well-perfused.  Skin:    General: Skin is warm and dry.  Neurological:     Mental Status: She is alert and oriented to person, place, and time. Mental status is at baseline.  Psychiatric:        Mood and Affect: Mood normal.     ED Results / Procedures / Treatments   Labs (all labs ordered are listed, but only abnormal results are displayed) Labs Reviewed - No data to display  EKG None  Radiology DG Knee Complete 4 Views Left  Result Date: 03/22/2022 CLINICAL DATA:  Pain. EXAM: LEFT KNEE - COMPLETE 4+ VIEW COMPARISON:  None Available. FINDINGS: Bones are diffusely demineralized. No evidence for an acute fracture. No subluxation or dislocation. No joint effusion. IMPRESSION: Negative. Electronically Signed   By: Kennith Center M.D.   On: 03/22/2022 10:25   DG Femur Min 2 Views Left  Result Date: 03/22/2022 CLINICAL  DATA:  Left hip and leg pain. EXAM: LEFT FEMUR 2 VIEWS COMPARISON:  01/27/2007 FINDINGS: Bones are diffusely demineralized. Degenerative changes are noted in the left hip. No femoral neck fracture. No worrisome lytic or sclerotic osseous abnormality. IMPRESSION: Degenerative changes in the left hip. No acute bony findings. Electronically Signed   By: Kennith Center M.D.   On: 03/22/2022 10:25   DG Pelvis 1-2 Views  Result Date: 03/22/2022 CLINICAL DATA:  Left hip pain. EXAM: PELVIS - 1-2 VIEW COMPARISON:  CT abdomen pelvis 03/24/2021. FINDINGS: Osteopenia. Right hip arthroplasty. Advanced degenerative changes in the left hip, including uniform loss of joint space, subchondral sclerosis and cyst formation. No definite fracture. IMPRESSION: 1. No definite fracture. 2. Advanced left hip osteoarthritis. 3. Osteopenia. Electronically  Signed   By: Leanna Battles M.D.   On: 03/22/2022 10:24    Procedures Procedures   Medications Ordered in ED Medications  acetaminophen (TYLENOL) tablet 1,000 mg (1,000 mg Oral Given 03/22/22 1052)    ED Course/ Medical Decision Making/ A&P Clinical Course as of 03/23/22 2237  Mon Mar 22, 2022  1231 Pt ambulated without difficulty.  [RP]    Clinical Course User Index [RP] Rondel Baton, MD                            Medical Decision Making Amount and/or Complexity of Data Reviewed Radiology: ordered.  Risk OTC drugs.   Ezlyn Osu is a 82 y.o. female with comorbidities that complicate the patient evaluation including with a history of arthritis who presents to the emergency department with leg pain  Initial Ddx:  Arthritis, DVT, limb ischemia, muscle strain, septic joint, inflammatory arthritis, pathologic fracture  MDM:  Unclear exactly what is causing the patient's symptoms at this time as it does not fit totally with any of the above diagnoses.  No lower extremity swelling that suggest DVT.  Still has good pulses and good perfusion making limb ischemia less likely.  No known injuries up to just a muscle strain.  Does not have joint effusion that would suggest an inflammatory arthritis or septic joint at this time.  Will obtain x-rays to evaluate for possible fracture but feel this is less likely.  Plan:  Lower extremity x-rays Tylenol Lidocaine patch  ED Summary/Re-evaluation:  Patient had x-rays of her hip, femur, and knee that did not show acute abnormalities.  Patient is able to ambulate in the emergency department without difficulty after receiving Tylenol and lidocaine patch.  He is instructed to take these at home and follow-up with an orthopedic doctor as an outpatient.  Feel that her symptoms may likely be due to arthritis  This patient presents to the ED for concern of complaints listed in HPI, this involves an extensive number of treatment options,  and is a complaint that carries with it a high risk of complications and morbidity. Disposition including potential need for admission considered.   Dispo: DC Home. Return precautions discussed including, but not limited to, those listed in the AVS. Allowed pt time to ask questions which were answered fully prior to dc.  Additional history obtained from daughter Records reviewed Outpatient Clinic Notes I independently reviewed the following imaging with scope of interpretation limited to determining acute life threatening conditions related to emergency care: Extremity x-ray(s) and agree with the radiologist interpretation with the following exceptions: None I have reviewed the patients home medications and made adjustments as needed Social Determinants of health:  Elderly   Final Clinical Impression(s) / ED Diagnoses Final diagnoses:  Left leg pain    Rx / DC Orders ED Discharge Orders     None         Rondel Baton, MD 03/23/22 2237

## 2022-03-22 NOTE — ED Triage Notes (Signed)
Pt c/o left leg pain that started last night; pt denies any falls

## 2022-03-22 NOTE — ED Notes (Signed)
Pt ambulated after applying lidocaine patch. Pt able to ambulate with some assistance. That is baseline according to family. Pt reported mild pain with ambulating.

## 2022-03-26 ENCOUNTER — Encounter: Payer: Self-pay | Admitting: Family Medicine

## 2022-03-26 ENCOUNTER — Ambulatory Visit (INDEPENDENT_AMBULATORY_CARE_PROVIDER_SITE_OTHER): Payer: 59 | Admitting: Family Medicine

## 2022-03-26 VITALS — BP 137/81 | HR 73 | Ht 60.0 in | Wt 84.0 lb

## 2022-03-26 DIAGNOSIS — M25572 Pain in left ankle and joints of left foot: Secondary | ICD-10-CM

## 2022-03-26 MED ORDER — CEPHALEXIN 500 MG PO CAPS: 500.0000 mg | ORAL_CAPSULE | Freq: Four times a day (QID) | ORAL | 0 refills | Status: DC

## 2022-03-26 NOTE — Progress Notes (Signed)
BP 137/81   Pulse 73   Ht 5' (1.524 m)   Wt 84 lb (38.1 kg)   SpO2 99%   BMI 16.41 kg/m    Subjective:   Patient ID: Robin [name], female    DOB: December 31, 1940, 82 y.o.   MRN: [id number]  HPI: Robin [name] is a 82 y.o. female presenting on 03/26/2022 for Ankle Pain (Left. Edema, warm to touch, painful. Denies injury. Seen at [facility] on 2/14- nothing determined)   HPI Left ankle pain and swelling and erythema. Patient is coming in today for left ankle pain and swelling and erythema and pain that started this morning.  She is here with her daughter because she has dementia and is giving most of the history.  She says that this morning she started having swelling and redness and warmth on the lateral aspect of that left ankle and that was very painful.  She says they were in the emergency department 5 days ago with her complaining of left groin and hip and knee pain and was given Tylenol and had an x-ray that improved by the next day but now the swelling and redness or warmth of her ankle has come up.  Relevant past medical, surgical, family and social history reviewed and updated as indicated. Interim medical history since our last visit reviewed. Allergies and medications reviewed and updated.  Review of Systems  Constitutional:  Negative for chills and fever.  Eyes:  Negative for visual disturbance.  Respiratory:  Negative for chest tightness and shortness of breath.   Cardiovascular:  Negative for chest pain and leg swelling.  Musculoskeletal:  Positive for arthralgias and joint swelling. Negative for back pain and gait problem.  Skin:  Positive for color change. Negative for rash and wound.  Psychiatric/Behavioral:  Negative for agitation and behavioral problems.   All other systems reviewed and are negative.   Per HPI unless specifically indicated above   Allergies as of 03/26/2022       Reactions   Dilaudid [hydromorphone Hcl] Nausea And Vomiting   Lisinopril Cough    Valium [diazepam] Other (See Comments)   Family states patient has adverse reaction and displays confusion.         Medication List        Accurate as of March 26, 2022  3:57 PM. If you have any questions, ask your nurse or doctor.          acetaminophen 325 MG tablet Commonly known as: TYLENOL Take 650 mg by mouth every 6 (six) hours as needed.   aspirin EC 81 MG tablet Take 81 mg by mouth daily. Swallow whole.   cephALEXin 500 MG capsule Commonly known as: KEFLEX Take 1 capsule (500 mg total) by mouth 4 (four) times daily.         Objective:   BP 137/81   Pulse 73   Ht 5' (1.524 m)   Wt 84 lb (38.1 kg)   SpO2 99%   BMI 16.41 kg/m   Wt Readings from Last 3 Encounters:  03/26/22 84 lb (38.1 kg)  03/22/22 90 lb (40.8 kg)  12/30/21 85 lb 6.4 oz (38.7 kg)    Physical Exam Vitals and nursing note reviewed.  Constitutional:      Appearance: Normal appearance.  Musculoskeletal:     Right hip: Normal.     Left hip: Normal.     Right knee: Normal.     Left knee: Normal.     Right ankle: Normal.  Left ankle: Swelling present. Tenderness present over the lateral malleolus.     Left Achilles Tendon: Normal.     Right foot: Normal. No swelling.     Left foot: Normal. No swelling.  Neurological:     General: No focal deficit present.     Mental Status: She is alert. Mental status is at baseline.     Pain mostly with internal rotation of the foot about the ankle  Assessment & Plan:   Problem List Items Addressed This Visit   None Visit Diagnoses     Acute left ankle pain    -  Primary   Relevant Orders   CBC with Differential/Platelet   Uric acid       Erythema has improved already since this morning, the likelihood is she has 1-2 issues as a possibility, 1 is cellulitis or skin infection, the other 1 is the possibility of gout versus trauma, I do not think trauma is anything major because they have not seen her fall or twist and she is able  to stand on it and walk now although painful Follow up plan: Return if symptoms worsen or fail to improve.  Counseling provided for all of the vaccine components Orders Placed This Encounter  Procedures   CBC with Differential/Platelet   Uric acid    Caryl Pina, MD [facility] 03/26/2022, 3:57 PM

## 2022-03-29 ENCOUNTER — Telehealth: Payer: Self-pay | Admitting: Family Medicine

## 2022-03-29 LAB — CBC WITH DIFFERENTIAL/PLATELET
Basophils Absolute: 0 10*3/uL (ref 0.0–0.2)
Basos: 0 %
EOS (ABSOLUTE): 0.1 10*3/uL (ref 0.0–0.4)
Eos: 1 %
Hematocrit: 28.1 % — ABNORMAL LOW (ref 34.0–46.6)
Hemoglobin: 8.8 g/dL — CL (ref 11.1–15.9)
Immature Grans (Abs): 0 10*3/uL (ref 0.0–0.1)
Immature Granulocytes: 1 %
Lymphocytes Absolute: 1.6 10*3/uL (ref 0.7–3.1)
Lymphs: 27 %
MCH: 28.4 pg (ref 26.6–33.0)
MCHC: 31.3 g/dL — ABNORMAL LOW (ref 31.5–35.7)
MCV: 91 fL (ref 79–97)
Monocytes Absolute: 0.5 10*3/uL (ref 0.1–0.9)
Monocytes: 8 %
Neutrophils Absolute: 3.7 10*3/uL (ref 1.4–7.0)
Neutrophils: 63 %
Platelets: 393 10*3/uL (ref 150–450)
RBC: 3.1 x10E6/uL — ABNORMAL LOW (ref 3.77–5.28)
RDW: 14.1 % (ref 11.7–15.4)
WBC: 5.9 10*3/uL (ref 3.4–10.8)

## 2022-03-29 LAB — URIC ACID: Uric Acid: 5.4 mg/dL (ref 3.1–7.9)

## 2022-03-29 NOTE — Telephone Encounter (Signed)
Yes I agree, see result note

## 2022-03-29 NOTE — Telephone Encounter (Signed)
[employer] called with critical lab result.  Hgb 8.8

## 2022-03-29 NOTE — Telephone Encounter (Signed)
Dr Dettinger has taken care of this already I think.  This was the same level she had 2 months ago.  Thakns for the heads up!

## 2022-03-30 DIAGNOSIS — M1612 Unilateral primary osteoarthritis, left hip: Secondary | ICD-10-CM | POA: Diagnosis not present

## 2022-03-30 DIAGNOSIS — M25572 Pain in left ankle and joints of left foot: Secondary | ICD-10-CM | POA: Diagnosis not present

## 2022-04-02 ENCOUNTER — Encounter: Payer: Self-pay | Admitting: Family Medicine

## 2022-04-02 ENCOUNTER — Ambulatory Visit (INDEPENDENT_AMBULATORY_CARE_PROVIDER_SITE_OTHER): Payer: 59 | Admitting: Family Medicine

## 2022-04-02 VITALS — BP 102/57 | HR 75 | Temp 98.9°F | Ht 60.0 in | Wt 80.0 lb

## 2022-04-02 DIAGNOSIS — S93492D Sprain of other ligament of left ankle, subsequent encounter: Secondary | ICD-10-CM

## 2022-04-02 DIAGNOSIS — D631 Anemia in chronic kidney disease: Secondary | ICD-10-CM

## 2022-04-02 DIAGNOSIS — N184 Chronic kidney disease, stage 4 (severe): Secondary | ICD-10-CM | POA: Diagnosis not present

## 2022-04-02 MED ORDER — DICLOFENAC SODIUM 1 % EX GEL: 2.0000 g | Freq: Four times a day (QID) | CUTANEOUS | 3 refills | Status: DC | PRN

## 2022-04-02 NOTE — Progress Notes (Signed)
Subjective: [id number] ankle pain PCP: Janora Norlander, DO [id number] Robin [name] is a 82 y.o. female presenting to clinic today for:  1. Left ankle pain Patient is brought into the office by her daughter.  She was seen just a couple of weeks ago for left-sided ankle pain.  There was no appreciable injury to the ankle.  The differential included gout flare versus cellulitis.  She is status post treatment with Keflex and her daughter and if there is been no appreciable improvement in that ankle pain.  She was seen by Dr. Layne Benton, the orthopedist that she sees for the wrist, and they did not feel that this was MSK in etiology but rather related to her renal function.  The swelling only occurs on the left side.  Not utilizing any NSAIDs  2.  CKD 4 Patient's daughter notes that she never did get a call regarding the labs that were released back in December.  She apparently was unaware of her mother's kidney disease diagnosis.  This has not limited use of NSAIDs in the past.  She hydrates fair.  No other known nephrotoxic agents.  Urine output has been unchanged   ROS: Per HPI  Allergies  Allergen Reactions   Dilaudid [Hydromorphone Hcl] Nausea And Vomiting   Lisinopril Cough   Valium [Diazepam] Other (See Comments)    Family states patient has adverse reaction and displays confusion.    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  Tobacco Use   Smoking status: Former    Packs/day: 0.25    Years: 5.00    Total pack years: 1.25    Types: Cigarettes    Quit date: 06/30/1977    Years since quitting: 44.7   Smokeless tobacco: Never  Vaping Use   Vaping Use: Never used  Substance and Sexual Activity   Alcohol use: No   Drug use: No   Sexual activity: Not Currently  Other Topics Concern   Not on file  Social History Narrative   Not on file   Social Determinants of Health   Financial Resource Strain: Low Risk  (06/30/2017)   Overall Financial Resource Strain (CARDIA)    Difficulty of Paying Living Expenses: Not very hard  Food Insecurity: No Food Insecurity (06/30/2017)   Hunger Vital Sign    Worried About Running Out of Food in the Last Year: Never true    [place] in the Last Year: Never true  Transportation Needs: No Transportation Needs (06/30/2017)   PRAPARE - Transportation    Lack of Transportation (  Medical): No    Lack of Transportation (Non-Medical): No  Physical Activity: Unknown (09/27/2018)   Exercise Vital Sign    Days of Exercise per Week: Not on file    Minutes of Exercise per Session: 60 min  Stress: No Stress Concern Present (06/30/2017)   [place]    Feeling of Stress : Not at all  Social Connections: Moderately Isolated (06/30/2017)   Social Connection and Isolation Panel [NHANES]    Frequency of Communication with Friends and Family: More than three times a week    Frequency of Social Gatherings with Friends and Family: More than three times a week    Attends Religious Services: Never    Marine scientist or Organizations: No    Attends Archivist Meetings: Never     Marital Status: Widowed  Intimate Partner Violence: Not At Risk (06/30/2017)   Humiliation, Afraid, Rape, and Kick questionnaire    Fear of Current or Ex-Partner: No    Emotionally Abused: No    Physically Abused: No    Sexually Abused: No   Family History  Problem Relation Age of Onset   Diabetes Mother    Diabetes Sister    Healthy Daughter    Healthy Son    Cancer Brother    Healthy Daughter     Objective: Office vital signs reviewed. BP (!) 102/57   Pulse 75   Temp 98.9 F (37.2 C)   Ht 5' (1.524 m)   Wt 80 lb (36.3 kg)   SpO2 96%   BMI 15.62 kg/m   Physical Examination:  General: Awake, alert, thin elderly female, No acute distress HEENT: sclera [place], MMM MSK: Arrives in wheelchair  Left ankle: Patient has a left lateral ankle soft tissue swelling.  There is exquisite tenderness palpation over the ATFL.  There are no appreciable deformities both visually nor palpation.  She is able to plantar and dorsiflex without difficulty.  Right ankle without symmetric swelling noted in left  Assessment/ Plan: 82 y.o. female   Sprain of anterior talofibular ligament of left ankle, subsequent encounter - Plan: diclofenac Sodium (VOLTAREN) 1 % GEL  CKD (chronic kidney disease) stage 4, GFR 15-29 ml/min (HCC) - Plan: Ambulatory referral to Nephrology, Renal Function Panel  Anemia due to stage 4 chronic kidney disease ([place]) - Plan: Ambulatory referral to Nephrology, CBC  I strongly suspect that this may in fact have been an unwitnessed injury.  Patient suffers from dementia and I think this causes her to be a poor historian.  She is tender over the ATFL which is not consistent with CKD induced ankle swelling, particularly since this is not present on the right side.  I have placed her in an Ace wrap, encouraged icing the area, elevation of the ankle and I have given them samples of topical Voltaren gel and encouraged him to use this at least 3-4 times daily.  I have highly  recommended they totally avoid oral NSAIDs as she does suffer from CKD 4 and I explained that to her daughter at length today.  I have placed a referral to nephrology.  Encourage p.o. hydration.  I suspect that the anemia is likely secondary to CKD 4 given lack of iron deficiency but high ferritin level noted.  Hopefully they can help [place] correct this for her as well.  Check renal function panel, repeat CBC today  No orders of the defined types were placed in this encounter.  No orders of the defined types were placed in this encounter.    Janora Norlander, DO [place] [phone]

## 2022-04-02 NOTE — Patient Instructions (Signed)
Ankle Sprain  An ankle sprain is a stretch or tear in a ligament in your ankle. Ligaments are tissues that connect bones to each other.  An ankle sprain can happen when: The ankle rolls outward. This is called an inversion sprain. The ankle rolls inward. This is called an eversion sprain. What are the causes? An ankle sprain is caused by rolling or twisting your ankle. What increases the risk? You are more likely to get an ankle sprain if you play sports. What are the signs or symptoms?  Pain in your ankle. Swelling. Bruising. Bruises may form right after you sprain your ankle or 1-2 days later. Trouble standing or walking. How is this treated? An ankle sprain may be treated with: A brace or splint. This is used to keep the ankle from moving until it heals. An elastic bandage (dressing). This is used to support the ankle. Crutches. Pain medicine. Surgery. This may be needed if the sprain is very bad. Physical therapy. This can help you move your ankle better. Follow these instructions at home: If you have a brace or a splint that can be taken off: Wear the brace or splint as told by your doctor. Take it off only as told by your doctor. Check the skin around the brace or splint every day. Tell your doctor if you see problems. Loosen the brace or splint if your toes: Tingle. Become numb. Turn cold and blue. Keep the brace or splint clean and dry. If the brace or splint is not waterproof: Do not let it get wet. Cover it with a watertight covering when you take a bath or a shower. If you have an elastic dressing: Take it off to shower or bathe. Adjust it if it feels too tight. Loosen the dressing if your foot: Tingles. Becomes numb. Turns cold and blue. Managing pain, stiffness, and swelling If told, put ice on the affected area. If you have a removable brace or splint, take it off as told by your doctor. Put ice in a plastic bag. Place a towel between your skin and the  bag. Leave the ice on for 20 minutes, 2-3 times a day. If your skin turns bright red, take off the ice right away to prevent skin damage. The risk of damage is higher if you cannot feel pain, heat, or cold. Move your toes often. Raise your ankle above the level of your heart while you are sitting or lying down. General instructions Take over-the-counter and prescription medicines only as told by your doctor. Do not smoke or use any products that contain nicotine or tobacco. If you need help quitting, ask your doctor. Rest your ankle. Use crutches to support your body weight. Do not use your injured leg to support your body weight until your doctor says that you can. Ask your doctor when it is safe to drive if you have a brace or splint on your ankle. Contact a doctor if: Your bruises or swelling get worse all of a sudden. Your pain does not get better after you take medicine. Get help right away if: Your foot or toes are numb or blue. You have very bad pain that gets worse. This information is not intended to replace advice given to you by your health care provider. Make sure you discuss any questions you have with your health care provider. Document Revised: 10/28/2021 Document Reviewed: 10/28/2021 Elsevier Patient Education  [place].

## 2022-04-05 ENCOUNTER — Telehealth: Payer: Self-pay | Admitting: Family Medicine

## 2022-04-05 LAB — RENAL FUNCTION PANEL
Albumin: 4 g/dL (ref 3.8–4.8)
BUN/Creatinine Ratio: 9 — ABNORMAL LOW (ref 12–28)
BUN: 30 mg/dL — ABNORMAL HIGH (ref 8–27)
CO2: 16 mmol/L — ABNORMAL LOW (ref 20–29)
Calcium: 9.2 mg/dL (ref 8.7–10.3)
Chloride: 106 mmol/L (ref 96–106)
Creatinine, Ser: 3.16 mg/dL (ref 0.57–1.00)
Glucose: 147 mg/dL — ABNORMAL HIGH (ref 70–99)
Phosphorus: 4.3 mg/dL (ref 3.0–4.3)
Potassium: 4.9 mmol/L (ref 3.5–5.2)
Sodium: 138 mmol/L (ref 134–144)
eGFR: 14 mL/min/{1.73_m2} — ABNORMAL LOW (ref >59)

## 2022-04-05 LAB — CBC
Hematocrit: 27.8 % — ABNORMAL LOW (ref 34.0–46.6)
Hemoglobin: 8.9 g/dL — CL (ref 11.1–15.9)
MCH: 29.5 pg (ref 26.6–33.0)
MCHC: 32 g/dL (ref 31.5–35.7)
MCV: 92 fL (ref 79–97)
Platelets: 385 10*3/uL (ref 150–450)
RBC: 3.02 x10E6/uL — ABNORMAL LOW (ref 3.77–5.28)
RDW: 13.8 % (ref 11.7–15.4)
WBC: 7 10*3/uL (ref 3.4–10.8)

## 2022-04-05 NOTE — Telephone Encounter (Signed)
Called patient to schedule Medicare Annual Wellness Visit (AWV). Left message for patient to call back and schedule Medicare Annual Wellness Visit (AWV).  Last date of AWV: 09/27/2018   Please schedule an appointment at any time with either Mickel Baas or [place], [facility]. .  If any questions, please contact me at [phone].  Thank you,  Colletta [place],  [place] [facility] Direct Dial ??[phone]

## 2022-04-09 DIAGNOSIS — J449 Chronic obstructive pulmonary disease, unspecified: Secondary | ICD-10-CM | POA: Insufficient documentation

## 2022-04-14 ENCOUNTER — Other Ambulatory Visit (HOSPITAL_COMMUNITY): Payer: Self-pay | Admitting: Nephrology

## 2022-04-14 DIAGNOSIS — N179 Acute kidney failure, unspecified: Secondary | ICD-10-CM

## 2022-04-14 DIAGNOSIS — E8722 Chronic metabolic acidosis: Secondary | ICD-10-CM

## 2022-04-14 DIAGNOSIS — N184 Chronic kidney disease, stage 4 (severe): Secondary | ICD-10-CM

## 2022-04-14 DIAGNOSIS — D638 Anemia in other chronic diseases classified elsewhere: Secondary | ICD-10-CM

## 2022-04-19 ENCOUNTER — Ambulatory Visit: Payer: 59 | Admitting: Family Medicine

## 2022-04-19 ENCOUNTER — Encounter: Payer: Self-pay | Admitting: Family Medicine

## 2022-04-19 NOTE — Progress Notes (Deleted)
Subjective: CC:*** PCP: Janora Norlander, DO [id number] Robin [name] is a 82 y.o. female presenting to clinic today for:  1. ***   ROS: Per HPI  Allergies  Allergen Reactions   Dilaudid [Hydromorphone Hcl] Nausea And Vomiting   Lisinopril Cough   Valium [Diazepam] Other (See Comments)    Family states patient has adverse reaction and displays confusion.    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  Tobacco Use   Smoking status: Former    Packs/day: 0.25    Years: 5.00    Total pack years: 1.25    Types: Cigarettes    Quit date: 06/30/1977    Years since quitting: 44.8   Smokeless tobacco: Never  Vaping Use   Vaping Use: Never used  Substance and Sexual Activity   Alcohol use: No   Drug use: No   Sexual activity: Not Currently  Other Topics Concern   Not on file  Social History Narrative   Not on file   Social Determinants of Health   Financial Resource Strain: Low Risk  (06/30/2017)   Overall Financial Resource Strain (CARDIA)    Difficulty of  Paying Living Expenses: Not very hard  Food Insecurity: No Food Insecurity (06/30/2017)   Hunger Vital Sign    Worried About Running Out of Food in the Last Year: Never true    Ran Out of Food in the Last Year: Never true  Transportation Needs: No Transportation Needs (06/30/2017)   PRAPARE - Hydrologist (Medical): No    Lack of Transportation (Non-Medical): No  Physical Activity: Unknown (09/27/2018)   Exercise Vital Sign    Days of Exercise per Week: Not on file    Minutes of Exercise per Session: 60 min  Stress: No Stress Concern Present (06/30/2017)   [place]    Feeling of Stress : Not at all  Social Connections: Moderately Isolated (06/30/2017)   Social Connection and Isolation Panel [NHANES]    Frequency of Communication with Friends and Family: More than three times a week    Frequency of Social Gatherings with Friends and Family: More than three times a week    Attends Religious Services: Never    Marine scientist or Organizations: No    Attends Archivist Meetings: Never    Marital Status: Widowed  Human resources officer  Violence: Not At Risk (06/30/2017)   Humiliation, Afraid, Rape, and Kick questionnaire    Fear of Current or Ex-Partner: No    Emotionally Abused: No    Physically Abused: No    Sexually Abused: No   Family History  Problem Relation Age of Onset   Diabetes Mother    Diabetes Sister    Healthy Daughter    Healthy Son    Cancer Brother    Healthy Daughter     Objective: Office vital signs reviewed. There were no vitals taken for this visit.  Physical Examination:  General: Awake, alert, *** nourished, No acute distress HEENT: Normal    Neck: No masses palpated. No lymphadenopathy    Ears: Tympanic membranes intact, normal light reflex, no erythema, no bulging    Eyes: PERRLA, extraocular membranes intact, sclera ***    Nose: nasal turbinates  moist, *** nasal discharge    Throat: moist mucus membranes, no erythema, *** tonsillar exudate.  Airway is patent Cardio: regular rate and rhythm, S1S2 heard, no murmurs appreciated Pulm: clear to auscultation bilaterally, no wheezes, rhonchi or rales; normal work of breathing on room air GI: soft, non-tender, non-distended, bowel sounds present x4, no hepatomegaly, no splenomegaly, no masses GU: external vaginal tissue ***, cervix ***, *** punctate lesions on cervix appreciated, *** discharge from cervical os, *** bleeding, *** cervical motion tenderness, *** abdominal/ adnexal masses Extremities: warm, well perfused, No edema, cyanosis or clubbing; +*** pulses bilaterally MSK: *** gait and *** station Skin: dry; intact; no rashes or lesions Neuro: *** Strength and light touch sensation grossly intact, *** DTRs ***/4  Assessment/ Plan: 82 y.o. female   ***  No orders of the defined types were placed in this encounter.  No orders of the defined types were placed in this encounter.    Janora Norlander, DO [place] [phone]

## 2022-04-20 ENCOUNTER — Encounter: Payer: Self-pay | Admitting: Family Medicine

## 2022-04-20 ENCOUNTER — Ambulatory Visit (INDEPENDENT_AMBULATORY_CARE_PROVIDER_SITE_OTHER): Payer: 59 | Admitting: Family Medicine

## 2022-04-20 VITALS — BP 141/66 | HR 80 | Temp 98.6°F | Ht 60.0 in | Wt 84.0 lb

## 2022-04-20 DIAGNOSIS — S93492D Sprain of other ligament of left ankle, subsequent encounter: Secondary | ICD-10-CM | POA: Diagnosis not present

## 2022-04-20 DIAGNOSIS — N184 Chronic kidney disease, stage 4 (severe): Secondary | ICD-10-CM | POA: Diagnosis not present

## 2022-04-20 DIAGNOSIS — D631 Anemia in chronic kidney disease: Secondary | ICD-10-CM | POA: Diagnosis not present

## 2022-04-20 NOTE — Patient Instructions (Signed)
Referred to:Located in: [facility] at [facility] Address: [address] [place], [place], [place] [address] Phone: [phone]

## 2022-04-20 NOTE — Progress Notes (Signed)
Subjective: CC: Follow-up ankle pain PCP: Janora Norlander, DO [id number] Robin [name] is a 82 y.o. female presenting to clinic today for:  1.  Ankle pain and swelling Patient was seen on 04/02/2022 for left-sided lateral ankle pain and swelling.  This was thought to be perhaps a sprain of the ankle.  She has been told however by one of the kidney specialist that they thought this was renally mediated but she notes that she only has this on the left side.  She continues to be exquisitely tender over the left ATFL.  She was not told that she had anything like gout.  She is accompanied today's visit by her grandchild and they are asking that she seek further evaluation with an ankle specialist  She also continues to have difficulty with appetite.  She drinks sodas frequently and drinks very little water.  They have seen the renal specialist and there is workup planned with them.  She does not report any change in urine output, chest pain or shortness of breath today   ROS: Per HPI  Allergies  Allergen Reactions   Dilaudid [Hydromorphone Hcl] Nausea And Vomiting   Lisinopril Cough   Valium [Diazepam] Other (See Comments)    Family states patient has adverse reaction and displays confusion.    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  Tobacco Use   Smoking status: Former    Packs/day: 0.25    Years: 5.00    Total pack years: 1.25    Types: Cigarettes    Quit date: 06/30/1977    Years since quitting: 44.8   Smokeless tobacco: Never  Vaping Use   Vaping Use: Never used  Substance and Sexual Activity   Alcohol use: No   Drug use: No   Sexual activity: Not Currently  Other Topics Concern   Not on file  Social History Narrative   Not on file   Social Determinants of Health   Financial Resource Strain: Low Risk  (06/30/2017)   Overall Financial Resource Strain (CARDIA)    Difficulty of Paying Living Expenses: Not very hard  Food Insecurity: No Food Insecurity (06/30/2017)   Hunger Vital Sign    Worried About Running Out of Food in the Last Year: Never true    Ran Out of Food in the Last Year: Never true  Transportation Needs: No Transportation Needs (06/30/2017)   PRAPARE - Hydrologist (Medical): No    Lack of Transportation (Non-Medical): No  Physical Activity: Unknown (09/27/2018)   Exercise Vital  Sign    Days of Exercise per Week: Not on file    Minutes of Exercise per Session: 60 min  Stress: No Stress Concern Present (06/30/2017)   [place]    Feeling of Stress : Not at all  Social Connections: Moderately Isolated (06/30/2017)   Social Connection and Isolation Panel [NHANES]    Frequency of Communication with Friends and Family: More than three times a week    Frequency of Social Gatherings with Friends and Family: More than three times a week    Attends Religious Services: Never    Marine scientist or Organizations: No    Attends Archivist Meetings: Never    Marital Status: Widowed  Intimate Partner Violence: Not At Risk (06/30/2017)   Humiliation,  Afraid, Rape, and Kick questionnaire    Fear of Current or Ex-Partner: No    Emotionally Abused: No    Physically Abused: No    Sexually Abused: No   Family History  Problem Relation Age of Onset   Diabetes Mother    Diabetes Sister    Healthy Daughter    Healthy Son    Cancer Brother    Healthy Daughter     Objective: Office vital signs reviewed. BP (!) 141/66   Pulse 80   Temp 98.6 F (37 C)   Ht 5' (1.524 m)   Wt 84 lb (38.1 kg)   SpO2 98%   BMI 16.41 kg/m   Physical Examination:  General: Awake, alert, thin chronically ill-appearing female, No acute distress HEENT: sclera [place], MMM Cardio: regular rate and rhythm, S1S2 heard, no murmurs appreciated Pulm: clear to auscultation bilaterally, no wheezes, rhonchi or rales; normal work of breathing on room air MSK: Antalgic gait and station.  Requires assistance for ambulation.  Continues to have soft tissue swelling and exquisite tenderness to palpation over the ATFL on the left  Assessment/ Plan: 82 y.o. female   Sprain of anterior talofibular ligament of left ankle, subsequent encounter - Plan: Ambulatory referral to Sports Medicine  CKD (chronic kidney disease) stage 4, GFR 15-29 ml/min (HCC)  Anemia due to stage 4 chronic kidney disease ([place])  She still quite tender over the ATFL specifically.  I suspect that if this was may be a gout presentation this could be related to her renal disease but given lack of bilateral pathology I feel that this is probably more MSK mediated.  Referral to sports medicine for consideration of evaluation under ultrasound.  If arthritic changes are noted perhaps she would benefit from a corticosteroid injection.  I reiterated need to avoid NSAID medications totally given CKD 4 and known anemia.  They were interested in potentially supplementing her diet and I recommended a kidney safe supplement like Nepro.  Hydration encouraged.  Continue follow-up with renal as directed  No orders of  the defined types were placed in this encounter.  No orders of the defined types were placed in this encounter.    Janora Norlander, DO [place] [phone]

## 2022-04-26 ENCOUNTER — Ambulatory Visit (HOSPITAL_COMMUNITY)
Admission: RE | Admit: 2022-04-26 | Discharge: 2022-04-26 | Disposition: A | Discharge status: Home / Self Care | Payer: 59 | Source: Ambulatory Visit | Attending: Nephrology | Admitting: Nephrology

## 2022-04-26 DIAGNOSIS — E8722 Chronic metabolic acidosis: Secondary | ICD-10-CM | POA: Insufficient documentation

## 2022-04-26 DIAGNOSIS — N179 Acute kidney failure, unspecified: Secondary | ICD-10-CM | POA: Insufficient documentation

## 2022-04-26 DIAGNOSIS — D638 Anemia in other chronic diseases classified elsewhere: Secondary | ICD-10-CM | POA: Insufficient documentation

## 2022-04-26 DIAGNOSIS — N184 Chronic kidney disease, stage 4 (severe): Secondary | ICD-10-CM | POA: Insufficient documentation

## 2022-05-10 ENCOUNTER — Telehealth: Payer: Self-pay | Admitting: Family Medicine

## 2022-05-10 NOTE — Telephone Encounter (Signed)
Contacted Robin [name] to schedule their annual wellness visit. Appointment made for 05/18/2022.  Thank you,  Colletta [place],  [place] Program Direct Dial ??[phone]

## 2022-05-14 ENCOUNTER — Other Ambulatory Visit: Payer: 59

## 2022-05-18 ENCOUNTER — Ambulatory Visit (INDEPENDENT_AMBULATORY_CARE_PROVIDER_SITE_OTHER): Payer: 59

## 2022-05-18 VITALS — Ht 64.0 in | Wt 83.0 lb

## 2022-05-18 DIAGNOSIS — Z0100 Encounter for examination of eyes and vision without abnormal findings: Secondary | ICD-10-CM

## 2022-05-18 DIAGNOSIS — Z0000 Encounter for general adult medical examination without abnormal findings: Secondary | ICD-10-CM

## 2022-05-18 NOTE — Patient Instructions (Signed)
Robin [name] , Thank you for taking time to come for your Medicare Wellness Visit. I appreciate your ongoing commitment to your health goals. Please review the following plan we discussed and let me know if I can assist you in the future.   These are the goals we discussed:  Goals      DIET - INCREASE WATER INTAKE     Try to drink 6-8 glasses of water daily.     Exercise 150 min/wk Moderate Activity        This is a list of the screening recommended for you and due dates:  Health Maintenance  Topic Date Due   COVID-19 Vaccine (1) Never done   Zoster (Shingles) Vaccine (1 of 2) 07/01/2022*   Pneumonia Vaccine (1 of 1 - PCV) 12/29/2022*   Flu Shot  09/09/2022   Medicare Annual Wellness Visit  05/18/2023   HPV Vaccine  Aged Out   DTaP/Tdap/Td vaccine  Discontinued   DEXA scan (bone density measurement)  Discontinued  *Topic was postponed. The date shown is not the original due date.    Advanced directives: Advance directive discussed with you today. I have provided a copy for you to complete at home and have notarized. Once this is complete please bring a copy in to our office so we can scan it into your chart.   Conditions/risks identified: Aim for 30 minutes of exercise or brisk walking, 6-8 glasses of water, and 5 servings of fruits and vegetables each day.   Next appointment: Follow up in one year for your annual wellness visit    Preventive Care 65 Years and Older, Female Preventive care refers to lifestyle choices and visits with your health care provider that can promote health and wellness. What does preventive care include? A yearly physical exam. This is also called an annual well check. Dental exams once or twice a year. Routine eye exams. Ask your health care provider how often you should have your eyes checked. Personal lifestyle choices, including: Daily care of your teeth and gums. Regular physical activity. Eating a healthy diet. Avoiding tobacco and drug  use. Limiting alcohol use. Practicing safe sex. Taking low-dose aspirin every day. Taking vitamin and mineral supplements as recommended by your health care provider. What happens during an annual well check? The services and screenings done by your health care provider during your annual well check will depend on your age, overall health, lifestyle risk factors, and family history of disease. Counseling  Your health care provider may ask you questions about your: Alcohol use. Tobacco use. Drug use. Emotional well-being. Home and relationship well-being. Sexual activity. Eating habits. History of falls. Memory and ability to understand (cognition). Work and work Astronomer. Reproductive health. Screening  You may have the following tests or measurements: Height, weight, and BMI. Blood pressure. Lipid and cholesterol levels. These may be checked every 5 years, or more frequently if you are over 65 years old. Skin check. Lung cancer screening. You may have this screening every year starting at age 36 if you have a 30-pack-year history of smoking and currently smoke or have quit within the past 15 years. Fecal occult blood test (FOBT) of the stool. You may have this test every year starting at age 26. Flexible sigmoidoscopy or colonoscopy. You may have a sigmoidoscopy every 5 years or a colonoscopy every 10 years starting at age 38. Hepatitis C blood test. Hepatitis B blood test. Sexually transmitted disease (STD) testing. Diabetes screening. This is done by checking  your blood sugar (glucose) after you have not eaten for a while (fasting). You may have this done every 1-3 years. Bone density scan. This is done to screen for osteoporosis. You may have this done starting at age 40. Mammogram. This may be done every 1-2 years. Talk to your health care provider about how often you should have regular mammograms. Talk with your health care provider about your test results, treatment  options, and if necessary, the need for more tests. Vaccines  Your health care provider may recommend certain vaccines, such as: Influenza vaccine. This is recommended every year. Tetanus, diphtheria, and acellular pertussis (Tdap, Td) vaccine. You may need a Td booster every 10 years. Zoster vaccine. You may need this after age 1. Pneumococcal 13-valent conjugate (PCV13) vaccine. One dose is recommended after age 92. Pneumococcal polysaccharide (PPSV23) vaccine. One dose is recommended after age 1. Talk to your health care provider about which screenings and vaccines you need and how often you need them. This information is not intended to replace advice given to you by your health care provider. Make sure you discuss any questions you have with your health care provider. Document Released: 02/21/2015 Document Revised: 10/15/2015 Document Reviewed: 11/26/2014 Elsevier Interactive Patient Education  2017 [employer] Prevention in the Home Falls can cause injuries. They can happen to people of all ages. There are many things you can do to make your home safe and to help prevent falls. What can I do on the outside of my home? Regularly fix the edges of walkways and driveways and fix any cracks. Remove anything that might make you trip as you walk through a door, such as a raised step or threshold. Trim any bushes or trees on the path to your home. Use bright outdoor lighting. Clear any walking paths of anything that might make someone trip, such as rocks or tools. Regularly check to see if handrails are loose or broken. Make sure that both sides of any steps have handrails. Any raised decks and porches should have guardrails on the edges. Have any leaves, snow, or ice cleared regularly. Use sand or salt on walking paths during winter. Clean up any spills in your garage right away. This includes oil or grease spills. What can I do in the bathroom? Use night lights. Install grab  bars by the toilet and in the tub and shower. Do not use towel bars as grab bars. Use non-skid mats or decals in the tub or shower. If you need to sit down in the shower, use a plastic, non-slip stool. Keep the floor dry. Clean up any water that spills on the floor as soon as it happens. Remove soap buildup in the tub or shower regularly. Attach bath mats securely with double-sided non-slip rug tape. Do not have throw rugs and other things on the floor that can make you trip. What can I do in the bedroom? Use night lights. Make sure that you have a light by your bed that is easy to reach. Do not use any sheets or blankets that are too big for your bed. They should not hang down onto the floor. Have a firm chair that has side arms. You can use this for support while you get dressed. Do not have throw rugs and other things on the floor that can make you trip. What can I do in the kitchen? Clean up any spills right away. Avoid walking on wet floors. Keep items that you use a lot  in easy-to-reach places. If you need to reach something above you, use a strong step stool that has a grab bar. Keep electrical cords out of the way. Do not use floor polish or wax that makes floors slippery. If you must use wax, use non-skid floor wax. Do not have throw rugs and other things on the floor that can make you trip. What can I do with my stairs? Do not leave any items on the stairs. Make sure that there are handrails on both sides of the stairs and use them. Fix handrails that are broken or loose. Make sure that handrails are as long as the stairways. Check any carpeting to make sure that it is firmly attached to the stairs. Fix any carpet that is loose or worn. Avoid having throw rugs at the top or bottom of the stairs. If you do have throw rugs, attach them to the floor with carpet tape. Make sure that you have a light switch at the top of the stairs and the bottom of the stairs. If you do not have them,  ask someone to add them for you. What else can I do to help prevent falls? Wear shoes that: Do not have high heels. Have rubber bottoms. Are comfortable and fit you well. Are closed at the toe. Do not wear sandals. If you use a stepladder: Make sure that it is fully opened. Do not climb a closed stepladder. Make sure that both sides of the stepladder are locked into place. Ask someone to hold it for you, if possible. Clearly mark and make sure that you can see: Any grab bars or handrails. First and last steps. Where the edge of each step is. Use tools that help you move around (mobility aids) if they are needed. These include: Canes. Walkers. Scooters. Crutches. Turn on the lights when you go into a dark area. Replace any light bulbs as soon as they burn out. Set up your furniture so you have a clear path. Avoid moving your furniture around. If any of your floors are uneven, fix them. If there are any pets around you, be aware of where they are. Review your medicines with your doctor. Some medicines can make you feel dizzy. This can increase your chance of falling. Ask your doctor what other things that you can do to help prevent falls. This information is not intended to replace advice given to you by your health care provider. Make sure you discuss any questions you have with your health care provider. Document Released: 11/21/2008 Document Revised: 07/03/2015 Document Reviewed: 03/01/2014 Elsevier Interactive Patient Education  2017 [employer].

## 2022-05-18 NOTE — Progress Notes (Signed)
Subjective:   Robin [name] is a 82 y.o. female who presents for Medicare Annual (Subsequent) preventive examination. I connected with  Robin [name] on 05/18/22 by a audio enabled telemedicine application and verified that I am speaking with the correct person using two identifiers.  Patient Location: Home  Provider Location: Home Office  I discussed the limitations of evaluation and management by telemedicine. The patient expressed understanding and agreed to proceed.  Review of Systems     Cardiac Risk Factors include: advanced age (>35men, >93 women)     Objective:    Today's Vitals   05/18/22 1539  Weight: 83 lb (37.6 kg)  Height: 5\' 4"  (1.626 m)   Body mass index is 14.25 kg/m.     05/18/2022    3:42 PM 03/22/2022    7:52 AM 10/29/2021    1:07 PM 03/24/2021   12:22 AM 09/27/2018    8:38 AM 09/07/2018    1:00 AM 09/06/2018    3:57 PM  Advanced Directives  Does Patient Have a Medical Advance Directive? No No No No No No No  Would patient like information on creating a medical advance directive? No - Patient declined   No - Patient declined No - Patient declined No - Patient declined     Current Medications (verified) Outpatient Encounter Medications as of 05/18/2022  Medication Sig   acetaminophen (TYLENOL) 325 MG tablet Take 650 mg by mouth every 6 (six) hours as needed.   aspirin EC 81 MG tablet Take 81 mg by mouth daily. Swallow whole.   diclofenac Sodium (VOLTAREN) 1 % GEL Apply 2 g topically 4 (four) times daily as needed (pain/ swelling).   No facility-administered encounter medications on file as of 05/18/2022.    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   TIA (transient ischemic attack) 10/08/2018   Past Surgical History:  Procedure Laterality  Date   APPENDECTOMY     CATARACT EXTRACTION W/PHACO Left 12/19/2017   Procedure: CATARACT EXTRACTION PHACO AND INTRAOCULAR LENS PLACEMENT LEFT EYE;  Surgeon: Gemma Payor, MD;  Location: [facility];  Service: Ophthalmology;  Laterality: Left;  CDE: 26.71   CATARACT EXTRACTION W/PHACO Right 01/02/2018   Procedure: CATARACT EXTRACTION PHACO AND INTRAOCULAR LENS PLACEMENT (IOC);  Surgeon: Gemma Payor, MD;  Location: [facility];  Service: Ophthalmology;  Laterality: Right;  CDE: 21.87   JOINT REPLACEMENT     TOTAL HIP ARTHROPLASTY Right 2012   TUBAL LIGATION     Family History  Problem Relation Age of Onset   Diabetes Mother    Diabetes Sister    Healthy Daughter    Healthy Son    Cancer Brother    Healthy Daughter    Social History   Socioeconomic History   Marital status: Widowed    Spouse name: Not on file   Number of children: 4   Years of education: 52   Highest education level: 11th grade  Occupational History   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  Tobacco Use   Smoking status: Former    Packs/day: 0.25    Years: 5.00    Additional pack years: 0.00    Total pack years: 1.25    Types: Cigarettes    Quit date:  06/30/1977    Years since quitting: 44.9   Smokeless tobacco: Never  Vaping Use   Vaping Use: Never used  Substance and Sexual Activity   Alcohol use: No   Drug use: No   Sexual activity: Not Currently  Other Topics Concern   Not on file  Social History Narrative   Not on file   Social Determinants of Health   Financial Resource Strain: Low Risk  (05/18/2022)   Overall Financial Resource Strain (CARDIA)    Difficulty of Paying Living Expenses: Not hard at all  Food Insecurity: No Food Insecurity (05/18/2022)   Hunger Vital Sign    Worried About Running Out of Food in the Last Year: Never true    Ran Out of Food in the Last Year: Never true  Transportation Needs: No Transportation Needs (05/18/2022)   PRAPARE - Therapist, art (Medical): No    Lack of Transportation (Non-Medical): No  Physical Activity: Inactive (05/18/2022)   Exercise Vital Sign    Days of Exercise per Week: 0 days    Minutes of Exercise per Session: 0 min  Stress: No Stress Concern Present (05/18/2022)   [employer] of Occupational Health - Occupational Stress Questionnaire    Feeling of Stress : Not at all  Social Connections: Socially Isolated (05/18/2022)   Social Connection and Isolation Panel [NHANES]    Frequency of Communication with Friends and Family: More than three times a week    Frequency of Social Gatherings with Friends and Family: More than three times a week    Attends Religious Services: Never    Database administrator or Organizations: No    Attends Banker Meetings: Never    Marital Status: Widowed    Tobacco Counseling Counseling given: Not Answered   Clinical Intake:  Pre-visit preparation completed: Yes  Pain : No/denies pain     Nutritional Risks: None Diabetes: No  How often do you need to have someone help you when you read instructions, pamphlets, or other written materials from your doctor or pharmacy?: 1 - Never  Diabetic?no   Interpreter Needed?: No  Information entered by :: Renie Ora, LPN   Activities of Daily Living    05/18/2022    3:42 PM  In your present state of health, do you have any difficulty performing the following activities:  Hearing? 0  Vision? 0  Difficulty concentrating or making decisions? 0  Walking or climbing stairs? 0  Dressing or bathing? 0  Doing errands, shopping? 0  Preparing Food and eating ? N  Using the Toilet? N  In the past six months, have you accidently leaked urine? N  Do you have problems with loss of bowel control? N  Managing your Medications? N  Managing your Finances? N  Housekeeping or managing your Housekeeping? N    Patient Care Team: Raliegh Ip, DO as PCP - General (Family  Medicine)  Indicate any recent Medical Services you may have received from other than [facility] providers in the past year (date may be approximate).     Assessment:   This is a routine wellness examination for [place].  Hearing/Vision screen Vision Screening - Comments:: Referral 05/18/2022  Dietary issues and exercise activities discussed: Current Exercise Habits: The patient does not participate in regular exercise at present, Exercise limited by: neurologic condition(s)   Goals Addressed             This Visit's Progress    DIET -  INCREASE WATER INTAKE   On track    Try to drink 6-8 glasses of water daily.       Depression Screen    05/18/2022    3:41 PM 04/20/2022    1:45 PM 04/02/2022    1:28 PM 03/26/2022    3:42 PM 12/30/2021    2:40 PM 10/27/2021    2:17 PM 05/31/2019   10:26 AM  PHQ 2/9 Scores  PHQ - 2 Score 0 0 0  0 0 0  PHQ- 9 Score 0 0 0      Exception Documentation    Patient refusal       Fall Risk    05/18/2022    3:40 PM 04/20/2022    1:45 PM 04/02/2022    1:28 PM 12/30/2021    2:40 PM 10/27/2021    2:17 PM  Fall Risk   Falls in the past year? 0 0 0 0 0  Number falls in past yr: 0 0 0    Injury with Fall? 0  0    Risk for fall due to : No Fall Risks No Fall Risks No Fall Risks    Follow up Falls prevention discussed Education provided Education provided      FALL RISK PREVENTION PERTAINING TO THE HOME:  Any stairs in or around the home? Yes  If so, are there any without handrails? No  Home free of loose throw rugs in walkways, pet beds, electrical cords, etc? Yes  Adequate lighting in your home to reduce risk of falls? Yes   ASSISTIVE DEVICES UTILIZED TO PREVENT FALLS:  Life alert? No  Use of a cane, walker or w/c? Yes  Grab bars in the bathroom? Yes  Shower chair or bench in shower? Yes  Elevated toilet seat or a handicapped toilet? Yes       12/30/2021    2:53 PM 06/30/2017    2:51 PM  MMSE - Mini Mental State Exam  Orientation to time 1 5   Orientation to Place 4 5  Registration 3 3  Attention/ Calculation 5 5  Recall 0 2  Language- name 2 objects 2 2  Language- repeat 1 1  Language- follow 3 step command 3 3  Language- read & follow direction 1 1  Write a sentence 1 1  Copy design 0 1  Total score 21 29        05/18/2022    3:42 PM 09/27/2018    8:43 AM  6CIT Screen  What Year? 4 points 0 points  What month? 3 points 0 points  What time? 3 points 0 points  Count back from 20 2 points 0 points  Months in reverse 2 points 0 points  Repeat phrase 4 points 2 points  Total Score 18 points 2 points    Immunizations Immunization History  Administered Date(s) Administered   Td 11/05/2003    TDAP status: Due, Education has been provided regarding the importance of this vaccine. Advised may receive this vaccine at local pharmacy or Health Dept. Aware to provide a copy of the vaccination record if obtained from local pharmacy or Health Dept. Verbalized acceptance and understanding.  Flu Vaccine status: Declined, Education has been provided regarding the importance of this vaccine but patient still declined. Advised may receive this vaccine at local pharmacy or Health Dept. Aware to provide a copy of the vaccination record if obtained from local pharmacy or Health Dept. Verbalized acceptance and understanding.  Pneumococcal vaccine status: Due, Education has  been provided regarding the importance of this vaccine. Advised may receive this vaccine at local pharmacy or Health Dept. Aware to provide a copy of the vaccination record if obtained from local pharmacy or Health Dept. Verbalized acceptance and understanding.  Covid-19 vaccine status: Declined, Education has been provided regarding the importance of this vaccine but patient still declined. Advised may receive this vaccine at local pharmacy or Health Dept.or vaccine clinic. Aware to provide a copy of the vaccination record if obtained from local pharmacy or Health Dept.  Verbalized acceptance and understanding.  Qualifies for Shingles Vaccine? Yes   Zostavax completed No   Shingrix Completed?: No.    Education has been provided regarding the importance of this vaccine. Patient has been advised to call insurance company to determine out of pocket expense if they have not yet received this vaccine. Advised may also receive vaccine at local pharmacy or Health Dept. Verbalized acceptance and understanding.  Screening Tests Health Maintenance  Topic Date Due   COVID-19 Vaccine (1) Never done   Zoster Vaccines- Shingrix (1 of 2) 07/01/2022 (Originally 08/21/1959)   Pneumonia Vaccine 5565+ Years old (1 of 1 - PCV) 12/29/2022 (Originally 08/22/2006)   INFLUENZA VACCINE  09/09/2022   Medicare Annual Wellness (AWV)  05/18/2023   HPV VACCINES  Aged Out   DTaP/Tdap/Td  Discontinued   DEXA SCAN  Discontinued    Health Maintenance  Health Maintenance Due  Topic Date Due   COVID-19 Vaccine (1) Never done    Colorectal cancer screening: No longer required.   Mammogram status: No longer required due to age.  Bone Density status: Ordered declined . Pt provided with contact info and advised to call to schedule appt.  Lung Cancer Screening: (Low Dose CT Chest recommended if Age 28-80 years, 30 pack-year currently smoking OR have quit w/in 15years.) does not qualify.   Lung Cancer Screening Referral: n/a  Additional Screening:  Hepatitis C Screening: does not qualify;   Vision Screening: Recommended annual ophthalmology exams for early detection of glaucoma and other disorders of the eye. Is the patient up to date with their annual eye exam?  No  Who is the provider or what is the name of the office in which the patient attends annual eye exams? None referral 05/18/2022 If pt is not established with a provider, would they like to be referred to a provider to establish care? No .   Dental Screening: Recommended annual dental exams for proper oral  hygiene  Community Resource Referral / Chronic Care Management: CRR required this visit?  No   CCM required this visit?  No      Plan:     I have personally reviewed and noted the following in the patient's chart:   Medical and social history Use of alcohol, tobacco or illicit drugs  Current medications and supplements including opioid prescriptions. Patient is not currently taking opioid prescriptions. Functional ability and status Nutritional status Physical activity Advanced directives List of other physicians Hospitalizations, surgeries, and ER visits in previous 12 months Vitals Screenings to include cognitive, depression, and falls Referrals and appointments  In addition, I have reviewed and discussed with patient certain preventive protocols, quality metrics, and best practice recommendations. A written personalized care plan for preventive services as well as general preventive health recommendations were provided to patient.     Lorrene ReidLaura L Wilson, LPN   0/1/02724/10/2022   Nurse Notes: Marchelle FolksAmanda patients grandaughter helped with [facility]

## 2022-06-15 ENCOUNTER — Encounter: Payer: Self-pay | Admitting: Family Medicine

## 2022-06-15 ENCOUNTER — Ambulatory Visit (INDEPENDENT_AMBULATORY_CARE_PROVIDER_SITE_OTHER): Payer: 59

## 2022-06-15 ENCOUNTER — Ambulatory Visit (INDEPENDENT_AMBULATORY_CARE_PROVIDER_SITE_OTHER): Payer: 59 | Admitting: Family Medicine

## 2022-06-15 VITALS — BP 147/74 | HR 86 | Temp 98.0°F | Ht 64.0 in | Wt 84.2 lb

## 2022-06-15 DIAGNOSIS — M25441 Effusion, right hand: Secondary | ICD-10-CM | POA: Diagnosis not present

## 2022-06-15 DIAGNOSIS — R030 Elevated blood-pressure reading, without diagnosis of hypertension: Secondary | ICD-10-CM | POA: Diagnosis not present

## 2022-06-15 MED ORDER — PREDNISONE 20 MG PO TABS: 40.0000 mg | ORAL_TABLET | Freq: Every day | ORAL | 0 refills | Status: AC

## 2022-06-15 NOTE — Progress Notes (Signed)
Acute Office Visit  Subjective:  Patient ID: Robin [name], female    DOB: Mar 30, 1940, 82 y.o.   MRN: [id number]  Chief Complaint  Patient presents with   swollen hand    Swollen right hand x 1 day     HPI Patient is in today for swollen right hand  Started yesterday and is worse today.  Has not tried anything to make it better. Previously had a brace, but does not have it currently. Had it due to hand swelling.  Some pain, intermittent. Denies fever.  Denies trauma, denies bite from tick/spider  Has hx of CKD, no HD.  States that many years ago she fell and maybe injured it, but nothing recent.  Is using voltaren gel "very little" maybe once every other day.   ROS As per HPI   Objective:  BP (!) 149/78   Pulse 86   Temp 98 F (36.7 C) (Temporal)   Ht 5\' 4"  (1.626 m)   Wt 84 lb 3.2 oz (38.2 kg)   SpO2 100%   BMI 14.45 kg/m    Physical Exam Constitutional:      General: She is not in acute distress.    Appearance: Normal appearance. She is not ill-appearing, toxic-appearing or diaphoretic.  Cardiovascular:     Rate and Rhythm: Normal rate.     Pulses: Normal pulses.     Heart sounds: Normal heart sounds. No murmur heard.    No gallop.  Pulmonary:     Effort: Pulmonary effort is normal. No respiratory distress.     Breath sounds: Normal breath sounds. No stridor. No wheezing, rhonchi or rales.  Musculoskeletal:     Right hand: Swelling, tenderness and bony tenderness present. No deformity or lacerations. Decreased range of motion. Decreased strength. Normal sensation. There is no disruption of two-point discrimination. Decreased capillary refill. Normal pulse.     Left hand: No swelling, deformity, lacerations, tenderness or bony tenderness. Normal range of motion. Normal strength. Normal sensation. There is no disruption of two-point discrimination. Decreased capillary refill. Normal pulse.  Skin:    General: Skin is warm.     Capillary Refill: Capillary  refill takes less than 2 seconds.  Neurological:     General: No focal deficit present.     Mental Status: She is alert and oriented to person, place, and time. Mental status is at baseline.     Motor: No weakness.  Psychiatric:        Mood and Affect: Mood normal.        Behavior: Behavior normal.        Thought Content: Thought content normal.        Judgment: Judgment normal.       05/18/2022    3:41 PM 04/20/2022    1:45 PM 04/02/2022    1:28 PM  Depression screen PHQ 2/9  Decreased Interest 0 0 0  Down, Depressed, Hopeless 0 0 0  PHQ - 2 Score 0 0 0  Altered sleeping 0 0 0  Tired, decreased energy 0 0 0  Change in appetite 0 0 0  Feeling bad or failure about yourself  0 0 0  Trouble concentrating 0 0 0  Moving slowly or fidgety/restless 0 0 0  Suicidal thoughts 0 0 0  PHQ-9 Score 0 0 0  Difficult doing work/chores Not difficult at all Not difficult at all Not difficult at all      04/20/2022    1:45 PM 04/02/2022  1:28 PM 12/30/2021    2:40 PM 10/27/2021    2:17 PM  GAD 7 : Generalized Anxiety Score  Nervous, Anxious, on Edge 0 0 0 0  Control/stop worrying 0 0 0 0  Worry too much - different things 0 0 0 0  Trouble relaxing 0 0 0 0  Restless 0 0 0 0  Easily annoyed or irritable 0 0 0 0  Afraid - awful might happen 0 0 0 0  Total GAD 7 Score 0 0 0 0  Anxiety Difficulty  Not difficult at all Not difficult at all Not difficult at all   Assessment & Plan:  1. Swelling of joint of hand, right Imaging as below to rule out acute fracture or infection of joint space. Prednisone as below for conservative pain management. Patient does not have diabetes. She does have a history of CKD and currently uses voltaren gel. Therefore, chose prednisone.  - DG Hand Complete Right; Future - predniSONE (DELTASONE) 20 MG tablet; Take 2 tablets (40 mg total) by mouth daily with breakfast for 5 days.  Dispense: 10 tablet; Refill: 0  2. Elevated blood pressure reading Elevated BP on exam  today. Instructed patient to follow up with PCP. Instructed patient to monitor her BP at home.   The above assessment and management plan was discussed with the patient. The patient verbalized understanding of and has agreed to the management plan using shared-decision making. Patient is aware to call the clinic if they develop any new symptoms or if symptoms fail to improve or worsen. Patient is aware when to return to the clinic for a follow-up visit. Patient educated on when it is appropriate to go to the emergency department.   Return if symptoms worsen or fail to improve.  Neale Burly, DNP-FNP [facility] [address] [place], [place] [address] [phone]

## 2022-06-25 ENCOUNTER — Inpatient Hospital Stay: Payer: 59 | Admitting: Hematology

## 2022-06-29 ENCOUNTER — Ambulatory Visit (INDEPENDENT_AMBULATORY_CARE_PROVIDER_SITE_OTHER): Payer: 59 | Admitting: Family Medicine

## 2022-06-29 ENCOUNTER — Encounter: Payer: Self-pay | Admitting: Family Medicine

## 2022-06-29 VITALS — BP 135/67 | HR 66 | Temp 98.6°F | Ht 64.0 in | Wt 88.0 lb

## 2022-06-29 DIAGNOSIS — I82412 Acute embolism and thrombosis of left femoral vein: Secondary | ICD-10-CM | POA: Diagnosis not present

## 2022-06-29 DIAGNOSIS — Z86718 Personal history of other venous thrombosis and embolism: Secondary | ICD-10-CM

## 2022-06-29 MED ORDER — APIXABAN 5 MG PO TABS: 5.0000 mg | ORAL_TABLET | Freq: Two times a day (BID) | ORAL | 3 refills | Status: DC

## 2022-06-29 NOTE — Progress Notes (Signed)
Subjective: CC: Follow-up ER PCP: Raliegh Ip, DO [id number] Robin [name] is a 82 y.o. female presenting to clinic today for:  1.  Acute DVT Patient seen in the ER on 06/24/2022 for acute DVT of the left lower extremity.  This involved the left femoral vein through the left posterior tibial veins.  She was placed on Eliquis 10 mg twice daily with instructions to reduce to 5 mg twice daily after 1 week.  She presents today for follow-up.  She is accompanied today by her daughter who notes that she has been tolerating the medication without difficulty.  Of note she does have history of documented DVT and was previously on anticoagulation for that but given that this was a singular event she did not have to continue it.  She reports no rectal, vaginal bleeding.  No epistaxis.   ROS: Per HPI  Allergies  Allergen Reactions   Hydromorphone Other (See Comments)   Hydromorphone Hcl Nausea And Vomiting   Dilaudid [Hydromorphone Hcl] Nausea And Vomiting   Lisinopril Cough and Other (See Comments)   Valium [Diazepam] Other (See Comments)    Family states patient has adverse reaction and displays confusion.    Past Medical History:  Diagnosis Date   Arthritis    Hypercholesteremia    Hypertension    Pneumonia    Reflux    Stroke (cerebrum) (HCC) 09/06/2018   TIA (transient ischemic attack) 10/08/2018    Current Outpatient Medications:    acetaminophen (TYLENOL) 325 MG tablet, Take 650 mg by mouth every 6 (six) hours as needed., Disp: , Rfl:    apixaban (ELIQUIS) 5 MG TABS tablet, Take two tablets (10 mg total) by mouth 2 (two) times daily for 7 days, followed by 1 tablet (5 mg total) by mouth 2 (two) times daily., Disp: , Rfl:    aspirin EC 81 MG tablet, Take 81 mg by mouth daily. 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  Tobacco Use   Smoking status: Former    Packs/day: 0.25    Years: 5.00    Additional pack years: 0.00    Total pack years: 1.25    Types: Cigarettes    Quit date: 06/30/1977    Years since quitting: 45.0   Smokeless tobacco: Never  Vaping Use   Vaping Use: Never used  Substance and Sexual Activity   Alcohol use: No   Drug use: No   Sexual activity: Not Currently  Other Topics Concern   Not on file  Social History Narrative   Not on file   Social Determinants of Health   Financial Resource Strain: Low Risk  (05/18/2022)   Overall Financial Resource Strain (CARDIA)    Difficulty of Paying Living Expenses: Not hard at all  Food Insecurity: No Food Insecurity (05/18/2022)   Hunger Vital Sign    Worried About Running Out of Food in the Last Year: Never true    Ran Out of Food in the Last Year: Never true  Transportation Needs: No Transportation Needs (05/18/2022)  PRAPARE - Administrator, Civil Service (Medical): No    Lack of Transportation (Non-Medical): No  Physical Activity: Inactive (05/18/2022)   Exercise Vital Sign    Days of Exercise per Week: 0 days    Minutes of Exercise per Session: 0 min  Stress: No Stress Concern Present (05/18/2022)   [employer] of Occupational Health - Occupational Stress Questionnaire    Feeling of Stress : Not at all  Social Connections: Socially Isolated (05/18/2022)   Social Connection and Isolation Panel [NHANES]    Frequency of Communication with Friends and Family: More than three times a week    Frequency of Social Gatherings with Friends and Family: More than three times a week    Attends Religious Services: Never    Database administrator or Organizations: No    Attends Tax inspector Meetings: Never    Marital Status: Widowed  Intimate Partner Violence: Not At Risk (05/18/2022)   Humiliation, Afraid, Rape, and Kick questionnaire    Fear of Current or Ex-Partner: No    Emotionally Abused: No    Physically Abused: No    Sexually Abused: No   Family History  Problem Relation Age of Onset   Diabetes Mother    Diabetes Sister    Healthy Daughter    Healthy Son    Cancer Brother    Healthy Daughter     Objective: Office vital signs reviewed. BP 135/67   Pulse 66   Temp 98.6 F (37 C)   Ht 5\' 4"  (1.626 m)   Wt 88 lb (39.9 kg)   SpO2 94%   BMI 15.11 kg/m   Physical Examination:  General: Awake, alert, thin, elderly, frail-appearing female, No acute distress Cardio: regular rate and rhythm  Pulm: Normal work of breathing on room air Extremities: Edema of the left lower extremity extending into to the thigh MSK: Minimal assistance for ambulation  Assessment/ Plan: 82 y.o. female   Acute deep vein thrombosis (DVT) of femoral vein of left lower extremity (HCC) - Plan: apixaban (ELIQUIS) 5 MG TABS tablet  History of DVT in adulthood - Plan: apixaban (ELIQUIS) 5 MG TABS tablet  Given recurrent DVT I discussed with her daughter that this would be a lifelong treatment as long as the benefits outweighed the risks.  I would like to see her back in a couple of months for repeat labs.  We discussed red flag signs and symptoms warranting further evaluation and/or potential discontinuation of the medication.  No orders of the defined types were placed in this encounter.  No orders of the defined types were placed in this encounter.   Raliegh Ip, DO [facility] [phone]

## 2022-07-07 ENCOUNTER — Encounter: Payer: 59 | Admitting: Vascular Surgery

## 2022-08-04 ENCOUNTER — Encounter: Payer: 59 | Admitting: Vascular Surgery

## 2022-08-20 ENCOUNTER — Ambulatory Visit: Payer: 59 | Admitting: Family Medicine

## 2022-08-21 ENCOUNTER — Emergency Department (HOSPITAL_COMMUNITY): Payer: 59

## 2022-08-21 ENCOUNTER — Other Ambulatory Visit: Payer: Self-pay

## 2022-08-21 ENCOUNTER — Encounter (HOSPITAL_COMMUNITY): Payer: Self-pay | Admitting: Emergency Medicine

## 2022-08-21 ENCOUNTER — Emergency Department (HOSPITAL_COMMUNITY)
Admission: EM | Admit: 2022-08-21 | Discharge: 2022-08-21 | Disposition: A | Discharge status: Home / Self Care | Payer: 59 | Attending: Emergency Medicine | Admitting: Emergency Medicine

## 2022-08-21 DIAGNOSIS — J189 Pneumonia, unspecified organism: Secondary | ICD-10-CM

## 2022-08-21 DIAGNOSIS — Z86718 Personal history of other venous thrombosis and embolism: Secondary | ICD-10-CM | POA: Insufficient documentation

## 2022-08-21 DIAGNOSIS — J181 Lobar pneumonia, unspecified organism: Secondary | ICD-10-CM | POA: Insufficient documentation

## 2022-08-21 DIAGNOSIS — R079 Chest pain, unspecified: Secondary | ICD-10-CM | POA: Diagnosis present

## 2022-08-21 DIAGNOSIS — Z7901 Long term (current) use of anticoagulants: Secondary | ICD-10-CM | POA: Diagnosis not present

## 2022-08-21 DIAGNOSIS — Z7982 Long term (current) use of aspirin: Secondary | ICD-10-CM | POA: Diagnosis not present

## 2022-08-21 LAB — CBC
HCT: 30.7 % — ABNORMAL LOW (ref 36.0–46.0)
Hemoglobin: 9.2 g/dL — ABNORMAL LOW (ref 12.0–15.0)
MCH: 29.7 pg (ref 26.0–34.0)
MCHC: 30 g/dL (ref 30.0–36.0)
MCV: 99 fL (ref 80.0–100.0)
Platelets: 439 10*3/uL — ABNORMAL HIGH (ref 150–400)
RBC: 3.1 MIL/uL — ABNORMAL LOW (ref 3.87–5.11)
RDW: 14.3 % (ref 11.5–15.5)
WBC: 9.9 10*3/uL (ref 4.0–10.5)
nRBC: 0 % (ref 0.0–0.2)

## 2022-08-21 LAB — BASIC METABOLIC PANEL
Anion gap: 9 (ref 5–15)
BUN: 35 mg/dL — ABNORMAL HIGH (ref 8–23)
CO2: 17 mmol/L — ABNORMAL LOW (ref 22–32)
Calcium: 8.5 mg/dL — ABNORMAL LOW (ref 8.9–10.3)
Chloride: 114 mmol/L — ABNORMAL HIGH (ref 98–111)
Creatinine, Ser: 3.49 mg/dL — ABNORMAL HIGH (ref 0.44–1.00)
GFR, Estimated: 13 mL/min — ABNORMAL LOW (ref >60)
Glucose, Bld: 102 mg/dL — ABNORMAL HIGH (ref 70–99)
Potassium: 4.2 mmol/L (ref 3.5–5.1)
Sodium: 140 mmol/L (ref 135–145)

## 2022-08-21 LAB — HEPATIC FUNCTION PANEL
ALT: 15 U/L (ref 0–44)
AST: 14 U/L — ABNORMAL LOW (ref 15–41)
Albumin: 3.1 g/dL — ABNORMAL LOW (ref 3.5–5.0)
Alkaline Phosphatase: 104 U/L (ref 38–126)
Bilirubin, Direct: <0.1 mg/dL (ref 0.0–0.2)
Total Bilirubin: 0.5 mg/dL (ref 0.3–1.2)
Total Protein: 6.7 g/dL (ref 6.5–8.1)

## 2022-08-21 LAB — TROPONIN I (HIGH SENSITIVITY)
Troponin I (High Sensitivity): 7 ng/L (ref <18)
Troponin I (High Sensitivity): 6 ng/L (ref <18)

## 2022-08-21 LAB — D-DIMER, QUANTITATIVE: D-Dimer, Quant: 1.33 ug/mL-FEU — ABNORMAL HIGH (ref 0.00–0.50)

## 2022-08-21 MED ORDER — DOXYCYCLINE HYCLATE 100 MG PO CAPS: 100.0000 mg | ORAL_CAPSULE | Freq: Two times a day (BID) | ORAL | 0 refills | Status: DC

## 2022-08-21 NOTE — ED Triage Notes (Signed)
Pt to ER via EMS form home with c/o right sided chest pain that radiates into back and increases with inhalation and is reproducible to palpation.  Pt states pain started around 3AM.  Pt denies SHOB, nausea, diaphoresis or other associated symptoms at this time. Pt states pain woke her from sleep.

## 2022-08-21 NOTE — ED Provider Notes (Signed)
[place] EMERGENCY DEPARTMENT AT [facility] Provider Note   CSN: [id number] Arrival date & time: 08/21/22  1002     History  Chief Complaint  Patient presents with   Chest Pain    Robin [name] is a 82 y.o. female.  Patient complains of right side chest pain.  Patient has a history of a stroke, DVT, renal failure and is on Eliquis.  The history is provided by the patient and medical records. No language interpreter was used.  Chest Pain Pain location:  R chest Pain quality: aching   Pain radiates to:  Does not radiate Pain severity:  Mild Onset quality:  Sudden Timing:  Constant Progression:  Worsening Chronicity:  New Context: not breathing   Associated symptoms: no abdominal pain, no back pain, no cough, no fatigue and no headache        Home Medications Prior to Admission medications   Medication Sig Start Date End Date Taking? Authorizing Provider  doxycycline (VIBRAMYCIN) 100 MG capsule Take 1 capsule (100 mg total) by mouth 2 (two) times daily. One po bid x 7 days 08/21/22  Yes Bethann Berkshire, MD  apixaban (ELIQUIS) 5 MG TABS tablet Take 1 tablet (5 mg total) by mouth 2 (two) times daily. 06/29/22   Raliegh Ip, DO  aspirin EC 81 MG tablet Take 81 mg by mouth daily. Swallow whole.    [provider]      Allergies    Hydromorphone, Hydromorphone hcl, Dilaudid [hydromorphone hcl], Lisinopril, and Valium [diazepam]    Review of Systems   Review of Systems  Constitutional:  Negative for appetite change and fatigue.  HENT:  Negative for congestion, ear discharge and sinus pressure.   Eyes:  Negative for discharge.  Respiratory:  Negative for cough.   Cardiovascular:  Positive for chest pain.  Gastrointestinal:  Negative for abdominal pain and diarrhea.  Genitourinary:  Negative for frequency and hematuria.  Musculoskeletal:  Negative for back pain.  Skin:  Negative for rash.  Neurological:  Negative for seizures and  headaches.  Psychiatric/Behavioral:  Negative for hallucinations.     Physical Exam Updated Vital Signs BP 127/60   Pulse 62   Temp 97.8 F (36.6 C)   Resp 18   Ht 5\' 4"  (1.626 m)   Wt 38 kg   SpO2 100%   BMI 14.38 kg/m  Physical Exam Vitals and nursing note reviewed.  Constitutional:      Appearance: She is well-developed.  HENT:     Head: Normocephalic.     Nose: Nose normal.  Eyes:     General: No scleral icterus.    Conjunctiva/sclera: Conjunctivae normal.  Neck:     Thyroid: No thyromegaly.  Cardiovascular:     Rate and Rhythm: Normal rate and regular rhythm.     Heart sounds: No murmur heard.    No friction rub. No gallop.  Pulmonary:     Breath sounds: No stridor. No wheezing or rales.  Chest:     Chest wall: No tenderness.  Abdominal:     General: There is no distension.     Tenderness: There is no abdominal tenderness. There is no rebound.  Musculoskeletal:        General: Normal range of motion.     Cervical back: Neck supple.  Lymphadenopathy:     Cervical: No cervical adenopathy.  Skin:    Findings: No erythema or rash.  Neurological:     Mental Status: She is alert and  oriented to person, place, and time.     Motor: No abnormal muscle tone.     Coordination: Coordination normal.  Psychiatric:        Behavior: Behavior normal.     ED Results / Procedures / Treatments   Labs (all labs ordered are listed, but only abnormal results are displayed) Labs Reviewed  BASIC METABOLIC PANEL - Abnormal; Notable for the following components:      Result Value   Chloride 114 (*)    CO2 17 (*)    Glucose, Bld 102 (*)    BUN 35 (*)    Creatinine, Ser 3.49 (*)    Calcium 8.5 (*)    GFR, Estimated 13 (*)    All other components within normal limits  CBC - Abnormal; Notable for the following components:   RBC 3.10 (*)    Hemoglobin 9.2 (*)    HCT 30.7 (*)    Platelets 439 (*)    All other components within normal limits  HEPATIC FUNCTION PANEL -  Abnormal; Notable for the following components:   Albumin 3.1 (*)    AST 14 (*)    All other components within normal limits  D-DIMER, QUANTITATIVE - Abnormal; Notable for the following components:   D-Dimer, Quant 1.33 (*)    All other components within normal limits  TROPONIN I (HIGH SENSITIVITY)  TROPONIN I (HIGH SENSITIVITY)    EKG None  Radiology DG Chest 2 View  Result Date: 08/21/2022 CLINICAL DATA:  Chest pain EXAM: CHEST - 2 VIEW COMPARISON:  03/24/2021 FINDINGS: Heart size within normal limits. Aortic atherosclerosis. Hyperinflated lungs. Patchy airspace opacity within the right mid to lower lung. Biapical pleuroparenchymal scarring is unchanged in appearance. No pleural effusion or pneumothorax. IMPRESSION: Patchy airspace opacity within the right mid to lower lung, concerning for pneumonia. Radiographic follow-up to resolution is recommended. Electronically Signed   By: Duanne Guess D.O.   On: 08/21/2022 11:25    Procedures Procedures    Medications Ordered in ED Medications - No data to display  ED Course/ Medical Decision Making/ A&P                             Medical Decision Making Amount and/or Complexity of Data Reviewed Labs: ordered. Radiology: ordered.  Risk Prescription drug management.  This patient presents to the ED for concern of chest pain, this involves an extensive number of treatment options, and is a complaint that carries with it a high risk of complications and morbidity.  The differential diagnosis includes MI, PE, pneumonia   Co morbidities that complicate the patient evaluation  DVT   Additional history obtained:  Additional history obtained from patient External records from outside source obtained and reviewed including hospital records   Lab Tests:  I Ordered, and personally interpreted labs.  The pertinent results include: Creatinine 3.5   Imaging Studies ordered:  I ordered imaging studies including chest  x-ray I independently visualized and interpreted imaging which showed pneumonia I agree with the radiologist interpretation   Cardiac Monitoring: / EKG:  The patient was maintained on a cardiac monitor.  I personally viewed and interpreted the cardiac monitored which showed an underlying rhythm of: Normal sinus rhythm   Consultations Obtained: No consultant  Problem List / ED Course / Critical interventions / Medication management  Chest pain I ordered medication including antibiotics for pneumonia Reevaluation of the patient after these medicines showed that the patient stayed  the same I have reviewed the patients home medicines and have made adjustments as needed   Social Determinants of Health:  None   Test / Admission - Considered:  None  Patient with pneumonia.  She is started on doxycycline and will follow-up with PCP        Final Clinical Impression(s) / ED Diagnoses Final diagnoses:  Community acquired pneumonia of right middle lobe of lung    Rx / DC Orders ED Discharge Orders          Ordered    doxycycline (VIBRAMYCIN) 100 MG capsule  2 times daily        08/21/22 1240              Bethann Berkshire, MD 08/23/22 1730

## 2022-08-21 NOTE — Discharge Instructions (Signed)
Follow-up this week with your doctor for recheck °

## 2022-08-23 ENCOUNTER — Telehealth: Payer: Self-pay

## 2022-08-23 ENCOUNTER — Encounter: Payer: Self-pay | Admitting: Family Medicine

## 2022-08-23 NOTE — Telephone Encounter (Signed)
Transition Care Management Unsuccessful Follow-up Telephone Call  Date of discharge and from where:  08/21/22 [name]  Attempts:  1st Attempt  Reason for unsuccessful TCM follow-up call:  Left voice message

## 2022-08-30 ENCOUNTER — Ambulatory Visit (INDEPENDENT_AMBULATORY_CARE_PROVIDER_SITE_OTHER): Payer: 59 | Admitting: Nurse Practitioner

## 2022-08-30 ENCOUNTER — Encounter: Payer: Self-pay | Admitting: Nurse Practitioner

## 2022-08-30 VITALS — BP 124/72 | HR 70 | Temp 97.8°F | Ht 64.0 in | Wt 80.4 lb

## 2022-08-30 DIAGNOSIS — J189 Pneumonia, unspecified organism: Secondary | ICD-10-CM

## 2022-08-30 DIAGNOSIS — R0602 Shortness of breath: Secondary | ICD-10-CM | POA: Diagnosis not present

## 2022-08-30 MED ORDER — ALBUTEROL SULFATE HFA 108 (90 BASE) MCG/ACT IN AERS: 2.0000 | INHALATION_SPRAY | Freq: Four times a day (QID) | RESPIRATORY_TRACT | 2 refills | Status: DC | PRN

## 2022-08-30 NOTE — Progress Notes (Signed)
Established Patient Office Visit  Subjective   Patient ID: Robin [name], female    DOB: [birth year]  Age: 82 y.o. MRN: [id number]  Chief Complaint  Patient presents with   Pneumonia    Hard to breathe  Was dx pneumonia last Saturday, states it is no better, got xray at ER said there was spot on lung that looked like start of pneumonia. Taking antibiotic but thinks pt may be allergic. Broke out on face and feels like whole body is on fire.    HPI Kourtnee Lahey is a 82 yr female with PMH of DVT, Stroke, dementia, and currently on Eliquis. C/o of SOB on exertion. Pneumonia: Patient complains of possible pneumonia. Patient describes symptoms of shortness of breath on exertion. Symptoms began 2 week ago and are stable since that time. Was sen at the ED 7/13/024 and was dx with community acquired pneumonia and treated with doxycine for 7-days, which  Chest x-ay at the ED showed " Hyperinflated lungs. Patchy airspace opacity within the right mid to lower lung". She has missed a few doses of her ATB. Sh has been staying on the proch and develop sunburn on the bridge of her nose. Patient denies cough, chest pain, weight loss, nausea and vomiting, or history of previous pneumonias. 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/2021   History of CVA (cerebrovascular accident) 12/30/2021   Mild protein-calorie malnutrition (HCC) 12/30/2021   Adenoma of left adrenal gland 03/24/2021   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 06/24/2016   Basal cell carcinoma of skin of unspecified ear and external auricular canal 06/24/2016   Chronic kidney disease (CKD), stage III (moderate) (HCC) 03/15/2014   Gastro-esophageal reflux disease without esophagitis 03/15/2014   Mixed hyperlipidemia 03/15/2014   Vasovagal near-syncope 03/15/2014   Left parietal scalp hematoma, initial encounter 03/15/2014   Intermittent palpitations 03/15/2014   Closed head injury with brief loss of consciousness (HCC) 03/15/2014   Normocytic anemia 01/08/2013   Calculus of kidney 10/04/2012   Past Medical History:  Diagnosis Date   Arthritis    Hypercholesteremia    Hypertension    Pneumonia    Reflux    Stroke (cerebrum) (HCC) 09/06/2018   TIA (transient ischemic attack) 10/08/2018   Past Surgical History:  Procedure Laterality Date   APPENDECTOMY     CATARACT EXTRACTION W/PHACO Left 12/19/2017   Procedure: CATARACT EXTRACTION PHACO AND INTRAOCULAR LENS PLACEMENT LEFT EYE;  Surgeon: Gemma Payor, MD;  Location: [facility];  Service: Ophthalmology;  Laterality: Left;  CDE: 26.71   CATARACT EXTRACTION W/PHACO Right 01/02/2018   Procedure: CATARACT EXTRACTION PHACO AND INTRAOCULAR LENS PLACEMENT (IOC);  Surgeon: Gemma Payor, MD;  Location: [facility];  Service: Ophthalmology;  Laterality: Right;  CDE: 21.87   JOINT REPLACEMENT     TOTAL HIP ARTHROPLASTY Right 2012   TUBAL LIGATION     Social History   Tobacco Use   Smoking status: Former    Current packs/day: 0.00    Average packs/day: 0.3  packs/day for 5.0 years (1.3 ttl pk-yrs)    Types: Cigarettes    Start date: 06/30/1972    Quit date: 06/30/1977    Years since quitting: 45.1   Smokeless tobacco: Never  Vaping Use   Vaping status: Never Used  Substance Use Topics   Alcohol use: No   Drug use: No   Social History   Socioeconomic History   Marital status: Widowed    Spouse name: Not on file   Number of children: 4   Years of education: 21    Highest education level: 11th grade  Occupational History   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  Tobacco Use   Smoking status: Former    Current packs/day: 0.00    Average packs/day: 0.3 packs/day for 5.0 years (1.3 ttl pk-yrs)    Types: Cigarettes    Start date: 06/30/1972    Quit date: 06/30/1977    Years since quitting: 45.1   Smokeless tobacco: Never  Vaping Use   Vaping status: Never Used  Substance and Sexual Activity   Alcohol use: No   Drug use: No   Sexual activity: Not Currently  Other Topics Concern   Not on file  Social History Narrative   Not on file   Social Determinants of Health   Financial Resource Strain: Low Risk  (05/18/2022)   Overall Financial Resource Strain (CARDIA)    Difficulty of Paying Living Expenses: Not hard at all  Food Insecurity: No Food Insecurity (05/18/2022)   Hunger Vital Sign    Worried About Running Out of Food in the Last Year: Never true    Ran Out of Food in the Last Year: Never true  Transportation Needs: No Transportation Needs (05/18/2022)   PRAPARE - Administrator, Civil Service (Medical): No    Lack of Transportation (Non-Medical): No  Physical Activity: Inactive (05/18/2022)   Exercise Vital Sign    Days of Exercise per Week: 0 days    Minutes of Exercise per Session: 0 min  Stress: No Stress Concern Present (05/18/2022)   [employer] of Occupational Health - Occupational Stress Questionnaire    Feeling of Stress : Not at all  Social Connections: Socially Isolated (05/18/2022)   Social Connection and Isolation Panel [NHANES]    Frequency of Communication with Friends and Family: More than three times a week    Frequency of Social Gatherings with Friends and Family: More than three times a week    Attends Religious Services: Never    Database administrator or Organizations: No    Attends Banker Meetings: Never    Marital Status: Widowed  Intimate Partner  Violence: Not At Risk (05/18/2022)   Humiliation, Afraid, Rape, and Kick questionnaire    Fear of Current or Ex-Partner: No    Emotionally Abused: No    Physically Abused: No    Sexually Abused: No   Family Status  Relation Name Status   Mother  Deceased at age 5   Father  Other       enlarged heart   Sister  Alive   Brother  Alive   Daughter  Alive   Son  Alive   Sister  Alive   Sister  Alive   Brother  Deceased at age 36   Daughter  Alive   Son  Deceased at age 65  No partnership data on file   Family History  Problem Relation Age  of Onset   Diabetes Mother    Diabetes Sister    Healthy Daughter    Healthy Son    Cancer Brother    Healthy Daughter    Allergies  Allergen Reactions   Hydromorphone Other (See Comments)   Hydromorphone Hcl Nausea And Vomiting   Dilaudid [Hydromorphone Hcl] Nausea And Vomiting   Lisinopril Cough and Other (See Comments)   Valium [Diazepam] Other (See Comments)    Family states patient has adverse reaction and displays confusion.       Review of Systems  Constitutional: Negative.  Negative for chills and fever.  Eyes:  Negative for photophobia.  Respiratory:  Positive for shortness of breath. Negative for cough, hemoptysis, sputum production and wheezing.   Cardiovascular:  Negative for chest pain and leg swelling.  Gastrointestinal:  Negative for nausea and vomiting.  Musculoskeletal:  Negative for myalgias.  Skin:  Negative for itching and rash.  Neurological:  Negative for dizziness and weakness.   Negative unless indicated in HPI   Objective:     BP 124/72   Pulse 70   Temp 97.8 F (36.6 C) (Temporal)   Ht 5\' 4"  (1.626 m)   Wt 80 lb 6.4 oz (36.5 kg)   SpO2 100%   BMI 13.80 kg/m  BP Readings from Last 3 Encounters:  08/30/22 124/72  08/21/22 127/60  06/29/22 135/67   Wt Readings from Last 3 Encounters:  08/30/22 80 lb 6.4 oz (36.5 kg)  08/21/22 83 lb 12.4 oz (38 kg)  06/29/22 88 lb (39.9 kg)      Physical  Exam Vitals and nursing note reviewed.  Constitutional:      General: She is not in acute distress.    Appearance: She is underweight. She is not ill-appearing.  HENT:     Head: Normocephalic and atraumatic.     Nose: No congestion or rhinorrhea.  Eyes:     General: No scleral icterus.    Extraocular Movements: Extraocular movements intact.     Conjunctiva/sclera: Conjunctivae normal.     Pupils: Pupils are equal, round, and reactive to light.  Cardiovascular:     Rate and Rhythm: Normal rate and regular rhythm.     Pulses: Normal pulses.     Heart sounds: Normal heart sounds.  Pulmonary:     Effort: Pulmonary effort is normal. No respiratory distress.     Breath sounds: Normal breath sounds. No wheezing.  Musculoskeletal:     Right lower leg: No edema.     Left lower leg: No edema.  Skin:    General: Skin is warm and dry.     Findings: Burn present.     Comments: Bridge of nose  Neurological:     Mental Status: She is alert and oriented to person, place, and time. Mental status is at baseline.  Psychiatric:        Mood and Affect: Mood normal.        Behavior: Behavior normal. Behavior is cooperative.      No results found for any visits on 08/30/22.  Last CBC Lab Results  Component Value Date   WBC 9.9 08/21/2022   HGB 9.2 (L) 08/21/2022   HCT 30.7 (L) 08/21/2022   MCV 99.0 08/21/2022   MCH 29.7 08/21/2022   RDW 14.3 08/21/2022   PLT 439 (H) 08/21/2022   Last metabolic panel Lab Results  Component Value Date   GLUCOSE 102 (H) 08/21/2022   NA 140 08/21/2022   K 4.2 08/21/2022  CL 114 (H) 08/21/2022   CO2 17 (L) 08/21/2022   BUN 35 (H) 08/21/2022   CREATININE 3.49 (H) 08/21/2022   GFRNONAA 13 (L) 08/21/2022   CALCIUM 8.5 (L) 08/21/2022   PHOS 4.3 04/02/2022   PROT 6.7 08/21/2022   ALBUMIN 3.1 (L) 08/21/2022   LABGLOB 1.8 12/30/2021   AGRATIO 2.3 (H) 12/30/2021   BILITOT 0.5 08/21/2022   ALKPHOS 104 08/21/2022   AST 14 (L) 08/21/2022   ALT 15  08/21/2022   ANIONGAP 9 08/21/2022   Last lipids Lab Results  Component Value Date   CHOL 190 04/09/2019   HDL 88 04/09/2019   LDLCALC 90 04/09/2019   TRIG 66 04/09/2019   CHOLHDL 2.2 04/09/2019   Last hemoglobin A1c Lab Results  Component Value Date   HGBA1C 4.8 12/30/2021   Last thyroid functions Lab Results  Component Value Date   TSH 1.060 02/20/2018   T4TOTAL 4.1 (L) 11/28/2012        Assessment & Plan:  Community acquired pneumonia of right lower lobe of lung  SOB (shortness of breath) on exertion  Other orders -     Albuterol Sulfate HFA; Inhale 2 puffs into the lungs every 6 (six) hours as needed for wheezing or shortness of breath.  Dispense: 8 g; Refill: 2   Jisela Merlino 82 yrs old underweight female I no acute distress  - Finish the course of the ATB that was prescribed at the ED - take it with and avoid sunlight - Ventolin 1 puff q 6 hrs for SOB  Return if symptoms worsen or fail to improve, for follow-up.    Arrie Aran Santa Lighter, DNP [facility] [address] [place], [place] [address] [phone]

## 2022-08-31 ENCOUNTER — Ambulatory Visit: Payer: 59 | Admitting: Family Medicine

## 2022-09-08 ENCOUNTER — Telehealth: Payer: Self-pay | Admitting: Family Medicine

## 2022-09-08 NOTE — Telephone Encounter (Signed)
Pts daughter needs advise on what OTC allergy medicine is ok for pt to take with the medications she is on. Says she is having an allergy flare up and really needs something OTC but doesn't know what to give her.

## 2022-09-08 NOTE — Telephone Encounter (Signed)
Please advise-wasn't sure what was safe due to her last kidney function and wt.

## 2022-09-09 ENCOUNTER — Other Ambulatory Visit: Payer: Self-pay | Admitting: Family Medicine

## 2022-09-09 DIAGNOSIS — J301 Allergic rhinitis due to pollen: Secondary | ICD-10-CM

## 2022-09-09 MED ORDER — LORATADINE 10 MG PO TABS: ORAL_TABLET | ORAL | 3 refills | Status: DC

## 2022-09-09 NOTE — Telephone Encounter (Signed)
Daughter aware and verbalizes understanding per dpr.

## 2022-09-09 NOTE — Telephone Encounter (Signed)
She may take claritin 10mg  every OTHER day.  [facility] [place].

## 2022-09-14 ENCOUNTER — Encounter: Payer: Self-pay | Admitting: Family Medicine

## 2022-09-14 ENCOUNTER — Ambulatory Visit (INDEPENDENT_AMBULATORY_CARE_PROVIDER_SITE_OTHER): Payer: 59 | Admitting: Family Medicine

## 2022-09-14 VITALS — BP 109/64 | HR 77 | Temp 98.0°F | Ht 64.0 in | Wt 77.0 lb

## 2022-09-14 DIAGNOSIS — N184 Chronic kidney disease, stage 4 (severe): Secondary | ICD-10-CM

## 2022-09-14 DIAGNOSIS — E440 Moderate protein-calorie malnutrition: Secondary | ICD-10-CM | POA: Diagnosis not present

## 2022-09-14 DIAGNOSIS — G3184 Mild cognitive impairment, so stated: Secondary | ICD-10-CM

## 2022-09-14 DIAGNOSIS — J410 Simple chronic bronchitis: Secondary | ICD-10-CM | POA: Diagnosis not present

## 2022-09-14 MED ORDER — BREZTRI AEROSPHERE 160-9-4.8 MCG/ACT IN AERO
2.0000 | INHALATION_SPRAY | Freq: Two times a day (BID) | RESPIRATORY_TRACT | 11 refills | Status: DC
Start: 2022-09-14 — End: 2023-12-26

## 2022-09-14 MED ORDER — MIRTAZAPINE 7.5 MG PO TABS: 7.5000 mg | ORAL_TABLET | Freq: Every day | ORAL | 0 refills | Status: DC

## 2022-09-14 NOTE — Patient Instructions (Addendum)
RINSE mouth after EACH use of Breztri. If not affordable, let me know ASAP so I can get you in with our pharmacist here Mirtazapine at bedtime to help stimulate appetite. If you decide you want some help from the palliative care team, let me know and I will arrange

## 2022-09-14 NOTE — Progress Notes (Signed)
Subjective: CC: Weight loss PCP: Raliegh Ip, DO EAV:[id number] Robin [name] is a 82 y.o. female presenting to clinic today for:  1.  Weight loss Patient with ongoing weight loss.  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  She has known dementia.  Her daughter reports that she is short of breath easily.  She was diagnosed with pulmonary infection in July and treated with doxycycline, which she developed a sunburn from.  She has been utilizing the albuterol and that does seem to help with the shortness of breath.  She has known COPD based on previous chest x-rays.   ROS: Per HPI  Allergies  Allergen Reactions   Hydromorphone Other (See Comments)   Hydromorphone Hcl Nausea And Vomiting   Dilaudid [Hydromorphone Hcl] Nausea And Vomiting   Lisinopril Cough and Other (See Comments)   Valium [Diazepam] Other (See Comments)    Family states patient has adverse reaction and displays confusion.    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 Renal dosing, Disp: 45 tablet, Rfl: 3   albuterol (VENTOLIN HFA) 108 (90 Base) MCG/ACT inhaler, Inhale 2 puffs into the lungs every 6 (six) hours as needed for wheezing or shortness of breath., Disp: 8 g, Rfl: 2   apixaban (ELIQUIS) 5 MG TABS tablet, Take 1 tablet (5 mg total) by mouth 2 (two) times daily., Disp: 180 tablet, Rfl: 3   aspirin EC 81 MG tablet, Take 81 mg by mouth daily. Swallow whole., Disp: , Rfl:    doxycycline (VIBRAMYCIN) 100 MG capsule, Take 1 capsule (100 mg total) by mouth 2 (two) times daily. 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  Tobacco Use   Smoking status: Former    Current packs/day: 0.00    Average packs/day: 0.3 packs/day for 5.0 years (1.3 ttl pk-yrs)    Types: Cigarettes    Start date: 06/30/1972    Quit date: 06/30/1977    Years since quitting: 45.2   Smokeless tobacco: Never  Vaping Use   Vaping status: Never Used  Substance and Sexual Activity   Alcohol use: No   Drug use: No   Sexual activity: Not Currently  Other Topics Concern   Not on file  Social History Narrative   Not on file   Social Determinants of Health   Financial Resource Strain: Low Risk  (05/18/2022)   Overall Financial Resource Strain (CARDIA)    Difficulty of Paying Living Expenses: Not hard at all  Food Insecurity: No Food Insecurity (05/18/2022)   Hunger Vital Sign    Worried About Running Out of Food in the Last Year: Never true    Ran Out of Food in the Last Year: Never true  Transportation Needs: No Transportation Needs (05/18/2022)   PRAPARE - Transportation    Lack of Transportation (Medical): No    Lack  of Transportation (Non-Medical): No  Physical Activity: Inactive (05/18/2022)   Exercise Vital Sign    Days of Exercise per Week: 0 days    Minutes of Exercise per Session: 0 min  Stress: No Stress Concern Present (05/18/2022)   [employer] of Occupational Health - Occupational Stress Questionnaire    Feeling of Stress : Not at all  Social Connections: Socially Isolated (05/18/2022)   Social Connection and Isolation Panel [NHANES]    Frequency of Communication with Friends and Family: More than three times a week    Frequency of Social Gatherings with Friends and Family: More than three times a week    Attends Religious Services: Never    Database administrator or Organizations: No    Attends Banker Meetings: Never    Marital Status: Widowed   Intimate Partner Violence: Not At Risk (05/18/2022)   Humiliation, Afraid, Rape, and Kick questionnaire    Fear of Current or Ex-Partner: No    Emotionally Abused: No    Physically Abused: No    Sexually Abused: No   Family History  Problem Relation Age of Onset   Diabetes Mother    Diabetes Sister    Healthy Daughter    Healthy Son    Cancer Brother    Healthy Daughter     Objective: Office vital signs reviewed. BP 109/64   Pulse 77   Temp 98 F (36.7 C)   Ht 5\' 4"  (1.626 m)   Wt 77 lb (34.9 kg)   SpO2 99%   BMI 13.22 kg/m   Physical Examination:  General: Awake, alert, cachectic, No acute distress HEENT: Sclera [place]kins.  Bitemporal wasting Cardio: regular rate and rhythm, S1S2 heard, no murmurs appreciated Pulm: clear to auscultation bilaterally, no wheezes, rhonchi or rales; normal work of breathing on room air Extremities: No edema.  She has a healing skin tear noted along the posterior aspect of the left calf  Assessment/ Plan: 82 y.o. female   Moderate protein-calorie malnutrition (HCC) - Plan: mirtazapine (REMERON) 7.5 MG tablet  Simple chronic bronchitis (HCC) - Plan: Budeson-Glycopyrrol-Formoterol (BREZTRI AEROSPHERE) 160-9-4.8 MCG/ACT AERO  Mild cognitive impairment  CKD (chronic kidney disease) stage 4, GFR 15-29 ml/min (HCC)  Suspect weight loss is multifactorial and likely related to cognitive impairment, chronic kidney disease and advanced age.  I will place her on low-dose mirtazapine with close follow-up given renal impairment.  Trial of Breztri for COPD.  She had globally decreased breath sounds and I did think that it seem to be worse on the right compared to the left.  There was concern for previous pulmonary infiltrate on this side.  Unsure if this is residual versus something of more concern but after today's discussion with her daughter it sounds like they would like minimally invasive treatment given her multiple issues, advanced age and dementia.   I am glad to revisit CT scan of the lungs that should she desire going forward.  I also offered palliative intervention but she would like to hold off on this for now as she feels well supported by her daughters and caring for her mother.  No orders of the defined types were placed in this encounter.  No orders of the defined types were placed in this encounter.    Raliegh Ip, DO [facility] [phone]

## 2022-10-11 ENCOUNTER — Encounter (HOSPITAL_COMMUNITY): Payer: Self-pay

## 2022-10-11 ENCOUNTER — Other Ambulatory Visit: Payer: Self-pay

## 2022-10-11 ENCOUNTER — Emergency Department (HOSPITAL_COMMUNITY): Admission: EM | Admit: 2022-10-11 | Payer: 59 | Source: Home / Self Care

## 2022-10-11 ENCOUNTER — Emergency Department (HOSPITAL_COMMUNITY): Payer: 59

## 2022-10-11 DIAGNOSIS — M25531 Pain in right wrist: Secondary | ICD-10-CM | POA: Diagnosis present

## 2022-10-11 DIAGNOSIS — W010XXA Fall on same level from slipping, tripping and stumbling without subsequent striking against object, initial encounter: Secondary | ICD-10-CM | POA: Diagnosis not present

## 2022-10-11 DIAGNOSIS — Z7901 Long term (current) use of anticoagulants: Secondary | ICD-10-CM | POA: Diagnosis not present

## 2022-10-11 DIAGNOSIS — S72464A Nondisplaced supracondylar fracture with intracondylar extension of lower end of right femur, initial encounter for closed fracture: Secondary | ICD-10-CM | POA: Diagnosis not present

## 2022-10-11 DIAGNOSIS — W19XXXA Unspecified fall, initial encounter: Secondary | ICD-10-CM

## 2022-10-11 DIAGNOSIS — I10 Essential (primary) hypertension: Secondary | ICD-10-CM | POA: Diagnosis not present

## 2022-10-11 DIAGNOSIS — S52501A Unspecified fracture of the lower end of right radius, initial encounter for closed fracture: Secondary | ICD-10-CM

## 2022-10-11 DIAGNOSIS — Z87891 Personal history of nicotine dependence: Secondary | ICD-10-CM | POA: Insufficient documentation

## 2022-10-11 DIAGNOSIS — Z8673 Personal history of transient ischemic attack (TIA), and cerebral infarction without residual deficits: Secondary | ICD-10-CM | POA: Insufficient documentation

## 2022-10-11 MED ORDER — HYDROCODONE-ACETAMINOPHEN 5-325 MG PO TABS
1.0000 | ORAL_TABLET | Freq: Once | ORAL | Status: AC
  Administered 2022-10-12: 1 via ORAL
  Filled 2022-10-11: qty 1

## 2022-10-11 MED ORDER — ONDANSETRON 4 MG PO TBDP
4.0000 mg | ORAL_TABLET | Freq: Once | ORAL | Status: AC
  Administered 2022-10-12: 4 mg via ORAL
  Filled 2022-10-11: qty 1

## 2022-10-11 MED ORDER — HYDROCODONE-ACETAMINOPHEN 5-325 MG PO TABS
1.0000 | ORAL_TABLET | Freq: Four times a day (QID) | ORAL | 0 refills | Status: AC | PRN
Start: 2022-10-11 — End: ?

## 2022-10-11 MED ORDER — ONDANSETRON HCL 4 MG PO TABS: 4.0000 mg | ORAL_TABLET | Freq: Three times a day (TID) | ORAL | Status: DC | PRN

## 2022-10-11 MED ORDER — ONDANSETRON HCL 4 MG PO TABS: 4.0000 mg | ORAL_TABLET | Freq: Three times a day (TID) | ORAL | 0 refills | Status: DC | PRN

## 2022-10-11 NOTE — ED Notes (Signed)
Pt has had severe vomiting to "strong pain medications" in the past. Informed Zofran ordered with medication. Pt requesting alternative medication. EDP informed.

## 2022-10-11 NOTE — ED Provider Notes (Addendum)
[place] EMERGENCY DEPARTMENT AT [facility] Provider Note   CSN: [id number] Arrival date & time: 10/11/22  1919     History  Chief Complaint  Patient presents with   [name]    Robin [name] is a 82 y.o. female.  Patient tripped over wires to her electric blanket in her bedroom.  Landing on her right side.  Complaining of right wrist pain right elbow pain.  And initially was not complaining of this but now complaining of right hip pain.  Patient had a right hip replacement many years ago.  Past medical history is significant for prior stroke in 2020 high cholesterol hypertension.  Patient's had total right hip arthroplasty appendectomy patient is a former smoker quit in 1979.  Patient is on Eliquis.  Patient did not hit her head no loss of conscious not complaining of any neck pain or back pain.  Does have some abrasions to her right knee.  Triage had ordered x-ray of the right wrist right elbow and right knee.  But not her right hip because she was not complaining of that at the time.       Home Medications Prior to Admission medications   Medication Sig Start Date End Date Taking? Authorizing Provider  albuterol (VENTOLIN HFA) 108 (90 Base) MCG/ACT inhaler Inhale 2 puffs into the lungs every 6 (six) hours as needed for wheezing or shortness of breath. 08/30/22  Yes [facility] [place] Lighter, Dois Davenport, NP  apixaban (ELIQUIS) 5 MG TABS tablet Take 1 tablet (5 mg total) by mouth 2 (two) times daily. 06/29/22  Yes Gottschalk, Ashly M, DO  Budeson-Glycopyrrol-Formoterol (BREZTRI AEROSPHERE) 160-9-4.8 MCG/ACT AERO Inhale 2 puffs into the lungs 2 (two) times daily. Patient taking differently: Inhale 2 puffs into the lungs as needed (out of breath). 09/14/22  Yes Gottschalk, Ashly M, DO  loratadine (CLARITIN) 10 MG tablet Take 1 tablet every OTHER day. Renal dosing 09/09/22  Yes Gottschalk, Ashly M, DO      Allergies    Hydromorphone, Hydromorphone hcl, Dilaudid [hydromorphone hcl],  Lisinopril, and Valium [diazepam]    Review of Systems   Review of Systems  Constitutional:  Negative for chills and fever.  HENT:  Negative for ear pain and sore throat.   Eyes:  Negative for pain and visual disturbance.  Respiratory:  Negative for cough and shortness of breath.   Cardiovascular:  Negative for chest pain and palpitations.  Gastrointestinal:  Negative for abdominal pain and vomiting.  Genitourinary:  Negative for dysuria and hematuria.  Musculoskeletal:  Positive for joint swelling. Negative for arthralgias and back pain.  Skin:  Negative for color change and rash.  Neurological:  Negative for seizures and syncope.  Hematological:  Bruises/bleeds easily.  All other systems reviewed and are negative.   Physical Exam Updated Vital Signs BP 131/67 (BP Location: Left Arm)   Pulse 75   Temp 98.5 F (36.9 C) (Oral)   Resp 20   Ht 1.626 m (5\' 4" )   Wt 40.4 kg   SpO2 98%   BMI 15.28 kg/m  Physical Exam Vitals and nursing note reviewed.  Constitutional:      General: She is not in acute distress.    Appearance: Normal appearance. She is well-developed. She is not ill-appearing.  HENT:     Head: Normocephalic and atraumatic.     Mouth/Throat:     Mouth: Mucous membranes are moist.  Eyes:     Extraocular Movements: Extraocular movements intact.     Conjunctiva/sclera:  Conjunctivae normal.     Pupils: Pupils are equal, round, and reactive to light.  Cardiovascular:     Rate and Rhythm: Normal rate and regular rhythm.     Heart sounds: No murmur heard. Pulmonary:     Effort: Pulmonary effort is normal. No respiratory distress.     Breath sounds: Normal breath sounds.  Abdominal:     Palpations: Abdomen is soft.     Tenderness: There is no abdominal tenderness.  Musculoskeletal:        General: Tenderness present. No swelling.     Cervical back: Normal range of motion and neck supple.     Comments: Swelling and tenderness to the right elbow and also distal  radius wrist area.  No snuffbox tenderness.  Good movement of her fingers.  Radial pulse 2+ good cap refill to her fingers on the right side.  Left upper extremity no problem left lower extremity no problem right lower extremity some abrasions superficial skin tears to her right knee no effusion no swelling.  However with movement of that leg she does point to pain in her right hip area.  No obvious deformity to the leg.  Skin:    General: Skin is warm and dry.     Capillary Refill: Capillary refill takes less than 2 seconds.  Neurological:     General: No focal deficit present.     Mental Status: She is alert and oriented to person, place, and time.  Psychiatric:        Mood and Affect: Mood normal.     ED Results / Procedures / Treatments   Labs (all labs ordered are listed, but only abnormal results are displayed) Labs Reviewed - No data to display  EKG None  Radiology DG Wrist Complete Right  Result Date: 10/11/2022 CLINICAL DATA:  Trip and fall.  Pain. EXAM: RIGHT WRIST - COMPLETE 3+ VIEW COMPARISON:  None Available. FINDINGS: Transverse impacted fracture of the distal radial metaphysis. Fracture extends into the distal radioulnar joint. No definite radiocarpal involvement. No displaced ulna styloid fracture. The carpal bones are intact. Vascular calcifications are seen. There is soft tissue edema. IMPRESSION: Transverse impacted distal radial metaphysis fracture with extension into the distal radioulnar joint. Electronically Signed   By: Narda Rutherford M.D.   On: 10/11/2022 20:10   DG Knee 2 Views Right  Result Date: 10/11/2022 CLINICAL DATA:  Trip and fall, anterior right knee. EXAM: RIGHT KNEE - 1-2 VIEW COMPARISON:  None Available. FINDINGS: No evidence of fracture, dislocation, or joint effusion. Osteoporosis. No erosive change or focal bone abnormality. Advanced vascular calcifications. No radiopaque foreign body or soft tissue gas. IMPRESSION: 1. No fracture or dislocation of  the right knee. 2. Osteoporosis. Electronically Signed   By: Narda Rutherford M.D.   On: 10/11/2022 20:09   DG Elbow Complete Right  Result Date: 10/11/2022 CLINICAL DATA:  Fall on right elbow, right elbow pain. EXAM: RIGHT ELBOW - COMPLETE 3+ VIEW COMPARISON:  None Available. FINDINGS: Displaced supracondylar fracture. Associated elbow joint effusion. No additional fracture. Intact radial head. Mild soft tissue edema. IMPRESSION: Displaced supracondylar fracture with elbow joint effusion. Electronically Signed   By: Narda Rutherford M.D.   On: 10/11/2022 20:07    Procedures Procedures    Medications Ordered in ED Medications - No data to display  ED Course/ Medical Decision Making/ A&P  Medical Decision Making Amount and/or Complexity of Data Reviewed Radiology: ordered.  Risk Prescription drug management.  Status post fall.  Patient with impacted fracture to her distal right radius.  Without significant malalignment.  Radiologist said there was displaced supracondylar fracture.  X-ray of the right knee had no bony abnormalities.  X-ray of the right hip is pending.  Discussed of the supracondylar concern with Dr. Romeo Apple and reviewed the x-rays.  He does not feel this is displaced.  He is recommending a long-arm splint for the wrist and the supracondylar fracture.  He can see this as an outpatient in his office.  Hopefully does not have a right hip fracture that will change the calculus on whether patient can be treated as an outpatient or not.  Trays of the right hip and pelvis without any acute evidence of fracture.  Patient will be stable for discharge home we will discharge home with hydrocodone and Zofran for the pain and follow-up with Dr. Tiburcio Pea.  Long-arm splint ordered.  Final Clinical Impression(s) / ED Diagnoses Final diagnoses:  Fall, initial encounter  Closed fracture of distal end of right radius, unspecified fracture morphology,  initial encounter  Closed nondisplaced supracondylar fracture of distal end of right femur with intracondylar extension, initial encounter [facility])    Rx / DC Orders ED Discharge Orders     None         Vanetta Mulders, MD 10/11/22 2252    Vanetta Mulders, MD 10/11/22 2340

## 2022-10-11 NOTE — ED Triage Notes (Signed)
Pt brought to ED via EMS  Pt stated she tripped over the wires going to bed. Fell on the RIGHT elbow. Complains of RIGHT elbow pain.  Skin tear to RIGHT knee.  Denies hitting head Denies neck and back pain No LOC, on blood thinners.   EMS gave a total of of Fent enroute.   Pt moving all extremities freely pain to RIGHT elbow in palpation ,

## 2022-10-11 NOTE — Discharge Instructions (Signed)
Take the hydrocodone as needed for the pain.  When not taking the hydrocodone which has Tylenol in it you can take extra strength Tylenol 2 tablets every 8 hours.  Keep the splint in place.  Take the Zofran as needed for nausea.  In the past medicines like hydrocodone have made you nauseated.  Both these medicines provided to you here tonight.  Tomorrow call Dr. Romeo Apple for follow-up.  Keep the splint in place and keep it dry.  Sling provided to help support the splint.

## 2022-10-12 DIAGNOSIS — S52501A Unspecified fracture of the lower end of right radius, initial encounter for closed fracture: Secondary | ICD-10-CM | POA: Diagnosis not present

## 2022-10-12 NOTE — ED Notes (Signed)
Discharge instructions provided by edp were discussed with pt and pt's daughter/caregiver at bedside. Pt was able to verbalized understanding with no additional questions at this time.

## 2022-10-12 NOTE — ED Notes (Signed)
Pt willing to take pain medication after discussing with edp

## 2022-10-13 ENCOUNTER — Ambulatory Visit (INDEPENDENT_AMBULATORY_CARE_PROVIDER_SITE_OTHER): Payer: 59 | Admitting: Orthopedic Surgery

## 2022-10-13 ENCOUNTER — Encounter: Payer: Self-pay | Admitting: Orthopedic Surgery

## 2022-10-13 VITALS — BP 131/67 | Ht 64.0 in | Wt 89.0 lb

## 2022-10-13 DIAGNOSIS — S52591A Other fractures of lower end of right radius, initial encounter for closed fracture: Secondary | ICD-10-CM

## 2022-10-13 DIAGNOSIS — S42471A Displaced transcondylar fracture of right humerus, initial encounter for closed fracture: Secondary | ICD-10-CM | POA: Diagnosis not present

## 2022-10-13 MED ORDER — HYDROCODONE-ACETAMINOPHEN 5-325 MG PO TABS: 1.0000 | ORAL_TABLET | Freq: Four times a day (QID) | ORAL | 0 refills | Status: AC | PRN

## 2022-10-13 MED ORDER — HYDROCODONE-ACETAMINOPHEN 5-325 MG PO TABS
1.0000 | ORAL_TABLET | Freq: Four times a day (QID) | ORAL | 0 refills | Status: DC | PRN
Start: 2022-10-13 — End: 2022-10-13

## 2022-10-13 MED FILL — Ondansetron HCl Tab 4 MG: ORAL | Qty: 4 | Status: AC

## 2022-10-13 MED FILL — Hydrocodone-Acetaminophen Tab 5-325 MG: ORAL | Qty: 6 | Status: AC

## 2022-10-13 NOTE — Progress Notes (Signed)
Patient: Robin [name]           Date of Birth: November 11, 1940           MRN: [id number] Visit Date: 10/13/2022 Requested by: Raliegh Ip, DO [address] [place],  [place] [address] PCP: Raliegh Ip, DO   Chief Complaint  Patient presents with   Wrist Injury    Right 10/11/22   Elbow Injury    Right    Encounter Diagnoses  Name Primary?   Other closed fracture of distal end of right radius, initial encounter Yes   Closed displaced transcondylar fracture of right humerus, initial encounter     Plan:  There is too much swelling to place the cast today.  A new posterior splint was applied.  Return in 2 weeks for x-ray in the splint and long-arm cast  Chief Complaint  Patient presents with   Wrist Injury    Right 10/11/22   Elbow Injury    Right     82 year old female referral from the emergency room who fell and fractured her right wrist and elbow.  The right wrist fracture is impacted the right elbow fracture is transcondylar and minimally displaced.  She complains of pain and swelling    Body mass index is 15.28 kg/m.   Problem list, medical hx, medications and allergies reviewed   Review of Systems  Constitutional:  Negative for fever.  Respiratory:  Negative for shortness of breath.   Cardiovascular:  Negative for chest pain.  Skin: Negative.   Neurological:  Negative for tingling and sensory change.     Allergies  Allergen Reactions   Hydromorphone Other (See Comments)   Hydromorphone Hcl Nausea And Vomiting   Dilaudid [Hydromorphone Hcl] Nausea And Vomiting   Lisinopril Cough and Other (See Comments)   Valium [Diazepam] Other (See Comments)    Family states patient has adverse reaction and displays confusion.     BP 131/67 Comment: 10/11/22  Ht 5\' 4"  (1.626 m)   Wt 89 lb (40.4 kg)   BMI 15.28 kg/m    Physical exam: Physical Exam Vitals and nursing note reviewed.  Constitutional:      Appearance: Normal appearance.  HENT:     Head:  Normocephalic and atraumatic.  Eyes:     General: No scleral icterus.       Right eye: No discharge.        Left eye: No discharge.     Extraocular Movements: Extraocular movements intact.     Conjunctiva/sclera: Conjunctivae normal.     Pupils: Pupils are equal, round, and reactive to light.  Cardiovascular:     Rate and Rhythm: Normal rate.     Pulses: Normal pulses.  Skin:    General: Skin is warm and dry.     Capillary Refill: Capillary refill takes less than 2 seconds.  Neurological:     General: No focal deficit present.     Mental Status: She is alert and oriented to person, place, and time.  Psychiatric:        Mood and Affect: Mood normal.        Behavior: Behavior normal.        Thought Content: Thought content normal.        Judgment: Judgment normal.     Ortho Exam  MSK:  She has quite a bit of swelling around the elbow some of it is secondary to the way the splint was applied and then the hand distal to the  level of the splint is also swollen on the dorsal side.  The main areas of tenderness are over the lateral epicondyle and the distal radius  There are no signs of compartment syndrome.  She can move all of her fingers.  No sensory deficits are detected  Data reviewed:   Image(s) reviewed with personal interpretation:  The right elbow shows a transcondylar fracture minimally displaced right humerus  The right wrist shows an impacted right distal radius fracture with minimal dorsal angulation  Assessment and plan:  Encounter Diagnoses  Name Primary?   Other closed fracture of distal end of right radius, initial encounter Yes   Closed displaced transcondylar fracture of right humerus, initial encounter     Nonoperative treatment recommended   Meds ordered this encounter  Medications   DISCONTD: HYDROcodone-acetaminophen (NORCO/VICODIN) 5-325 MG tablet    Sig: Take 1 tablet by mouth every 6 (six) hours as needed for up to 5 days.    Dispense:  20  tablet    Refill:  0   HYDROcodone-acetaminophen (NORCO/VICODIN) 5-325 MG tablet    Sig: Take 1 tablet by mouth every 6 (six) hours as needed for up to 5 days.    Dispense:  20 tablet    Refill:  0    Procedures:   No procedures today

## 2022-10-19 ENCOUNTER — Ambulatory Visit (INDEPENDENT_AMBULATORY_CARE_PROVIDER_SITE_OTHER): Payer: 59 | Admitting: Orthopedic Surgery

## 2022-10-19 ENCOUNTER — Other Ambulatory Visit (INDEPENDENT_AMBULATORY_CARE_PROVIDER_SITE_OTHER): Payer: 59

## 2022-10-19 DIAGNOSIS — M25551 Pain in right hip: Secondary | ICD-10-CM | POA: Diagnosis not present

## 2022-10-19 DIAGNOSIS — G8929 Other chronic pain: Secondary | ICD-10-CM | POA: Diagnosis not present

## 2022-10-19 DIAGNOSIS — M5441 Lumbago with sciatica, right side: Secondary | ICD-10-CM | POA: Diagnosis not present

## 2022-10-19 DIAGNOSIS — M545 Low back pain, unspecified: Secondary | ICD-10-CM | POA: Diagnosis not present

## 2022-10-19 MED ORDER — HYDROCODONE-ACETAMINOPHEN 5-325 MG PO TABS: 1.0000 | ORAL_TABLET | Freq: Four times a day (QID) | ORAL | 0 refills | Status: DC | PRN

## 2022-10-19 NOTE — Progress Notes (Signed)
Follow-up visit with new problem  Chief Complaint  Patient presents with   Hip Pain    DOI 10-11-22 Patient fell went to [facility] and had x rays  complaining of rt leg\hip pain can not put weight on that right side     The patient was seen for a fracture of her right upper extremity she comes in today saying she has right hip pain she is status post right total hip arthroplasty she has not had any problems with the hip up until the fall  She did have x-rays of her right hip at the time of injury of the right upper extremity and the hip implant was in good position there was no sign of fracture  The family tells me that she has had worsening pain in the right hip and now she cannot walk at all whereas before she could put a little bit of weight on her right side  This was not indicated on the time of her first visit  In any event she has tenderness really in her lower back and right buttock she has equal leg lengths no deformity she can lift the leg by herself although it is painful and on flexion extension of the hip she has no groin pain just back pain  No sensory deficits.  X-rays of the spine show very osteopenic spine it is hard to make out all the vertebrae or tell if there is any fracture  She has been taking 1/2 tablet of 5 mg of hydrocodone at night for pain and it does help  I discussed the treatment plan with her caregiver and family member as the patient has dementia and we decided to not get the CAT scan today but wait until her visit next week to check on her arm to see if giving her 5 mg of hydrocodone every 4-6 hours will help in terms of gait  I do not suspect she has a fractured vertebrae although the x-rays are so washed out from the osteopenia that is hard to tell.  If she did have a fracture we still treated the same with symptomatic medication and progressive mobilization as tolerated  Diagnosis  Gait abnormality back pain  Encounter Diagnosis  Name Primary?    Acute low back pain, unspecified back pain laterality, unspecified whether sciatica present Yes    Follow-up at the time of her next visit to review her wrist x-rays in cast to right upper extremity

## 2022-10-20 ENCOUNTER — Encounter: Payer: Self-pay | Admitting: Family Medicine

## 2022-10-20 ENCOUNTER — Other Ambulatory Visit: Payer: Self-pay | Admitting: Orthopedic Surgery

## 2022-10-20 ENCOUNTER — Ambulatory Visit (INDEPENDENT_AMBULATORY_CARE_PROVIDER_SITE_OTHER): Payer: 59 | Admitting: Family Medicine

## 2022-10-20 VITALS — BP 108/69 | HR 77 | Temp 98.8°F | Ht 64.0 in | Wt 84.4 lb

## 2022-10-20 DIAGNOSIS — S42471D Displaced transcondylar fracture of right humerus, subsequent encounter for fracture with routine healing: Secondary | ICD-10-CM

## 2022-10-20 DIAGNOSIS — J410 Simple chronic bronchitis: Secondary | ICD-10-CM | POA: Diagnosis not present

## 2022-10-20 DIAGNOSIS — N184 Chronic kidney disease, stage 4 (severe): Secondary | ICD-10-CM

## 2022-10-20 DIAGNOSIS — Z7409 Other reduced mobility: Secondary | ICD-10-CM

## 2022-10-20 DIAGNOSIS — E440 Moderate protein-calorie malnutrition: Secondary | ICD-10-CM

## 2022-10-20 DIAGNOSIS — G3184 Mild cognitive impairment, so stated: Secondary | ICD-10-CM

## 2022-10-20 DIAGNOSIS — Z789 Other specified health status: Secondary | ICD-10-CM

## 2022-10-20 MED ORDER — HYDROCODONE-ACETAMINOPHEN 5-325 MG PO TABS: 1.0000 | ORAL_TABLET | Freq: Four times a day (QID) | ORAL | 0 refills | Status: AC | PRN

## 2022-10-20 NOTE — Telephone Encounter (Signed)
It shows print/ pended so you can e prescribe

## 2022-10-20 NOTE — Telephone Encounter (Signed)
Dr. Mort Sawyers pt - pt's daughter called, stated that she was seen yesterday and that Dr. Romeo Apple was going to send a script for Hydrocodone to [facility].  She is stating the pharmacy says they have not received it yet.

## 2022-10-20 NOTE — Progress Notes (Signed)
Subjective: CC: Follow-up breathing, weight PCP: Raliegh Ip, DO MVH:QIONG Bionca Robin [name] is a 82 y.o. female presenting to clinic today for:  1.  COPD Patient reports that Markus Daft has been helpful with the breathing.  She has not used it since she fell on the second.  She is accompanied today's visit by her granddaughter who has been helping with much of her care.  She does have Breztri at home and will resume use  2.  Protein malnutrition/CKD 4, impaired mobility and endurance, cognitive impairment Her grandmother has been giving her food every 2 hours and she seems to have a good appetite up until she had her arm fractured.  She has been trying to supplement.  She is hydrating well.  Would like to get personal care services form completed.  She has a family member that is at home performing these services currently.   ROS: Per HPI  Allergies  Allergen Reactions   Hydromorphone Other (See Comments)   Hydromorphone Hcl Nausea And Vomiting   Dilaudid [Hydromorphone Hcl] Nausea And Vomiting   Lisinopril Cough and Other (See Comments)   Valium [Diazepam] Other (See Comments)    Family states patient has adverse reaction and displays confusion.    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 (Patient taking differently: Inhale 2 puffs into the lungs as needed (out of  breath).), Disp: 10.7 g, Rfl: 11   HYDROcodone-acetaminophen (NORCO/VICODIN) 5-325 MG tablet, Take 1 tablet by mouth every 6 (six) hours as needed for up to 5 days., Disp: 20 tablet, Rfl: 0   loratadine (CLARITIN) 10 MG tablet, Take 1 tablet every OTHER day. 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  Tobacco Use   Smoking status: Former    Current packs/day: 0.00    Average packs/day: 0.3 packs/day for 5.0 years (1.3 ttl pk-yrs)    Types: Cigarettes    Start date: 06/30/1972    Quit date: 06/30/1977    Years since quitting: 45.3   Smokeless tobacco: Never  Vaping Use   Vaping status: Never Used  Substance and Sexual Activity   Alcohol use: No   Drug use: No  Sexual activity: Not Currently  Other Topics Concern   Not on file  Social History Narrative   Not on file   Social Determinants of Health   Financial Resource Strain: Low Risk  (05/18/2022)   Overall Financial Resource Strain (CARDIA)    Difficulty of Paying Living Expenses: Not hard at all  Food Insecurity: No Food Insecurity (05/18/2022)   Hunger Vital Sign    Worried About Running Out of Food in the Last Year: Never true    Ran Out of Food in the Last Year: Never true  Transportation Needs: No Transportation Needs (05/18/2022)   PRAPARE - Administrator, Civil Service (Medical): No    Lack of Transportation (Non-Medical): No  Physical Activity:  Inactive (05/18/2022)   Exercise Vital Sign    Days of Exercise per Week: 0 days    Minutes of Exercise per Session: 0 min  Stress: No Stress Concern Present (05/18/2022)   [employer] of Occupational Health - Occupational Stress Questionnaire    Feeling of Stress : Not at all  Social Connections: Socially Isolated (05/18/2022)   Social Connection and Isolation Panel [NHANES]    Frequency of Communication with Friends and Family: More than three times a week    Frequency of Social Gatherings with Friends and Family: More than three times a week    Attends Religious Services: Never    Database administrator or Organizations: No    Attends Banker Meetings: Never    Marital Status: Widowed  Intimate Partner Violence: Not At Risk (05/18/2022)   Humiliation, Afraid, Rape, and Kick questionnaire    Fear of Current or Ex-Partner: No    Emotionally Abused: No    Physically Abused: No    Sexually Abused: No   Family History  Problem Relation Age of Onset   Diabetes Mother    Diabetes Sister    Healthy Daughter    Healthy Son    Cancer Brother    Healthy Daughter     Objective: Office vital signs reviewed. BP 108/69   Pulse 77   Temp 98.8 F (37.1 C)   Ht 5\' 4"  (1.626 m)   Wt 84 lb 6.4 oz (38.3 kg)   SpO2 99%   BMI 14.49 kg/m   Physical Examination:  General: Awake, alert, thin, elderly female, No acute distress HEENT: sclera [place], MMM Cardio: regular rate and rhythm, S1S2 heard, no murmurs appreciated Pulm: Globally decreased breath sounds that is predominant in the bases.  No wheezes, rhonchi or rales.  No dyspnea with speech MSK: Arrives in wheelchair.  Right arm in ace  Assessment/ Plan: 82 y.o. female   Mild cognitive impairment  CKD (chronic kidney disease) stage 4, GFR 15-29 ml/min (HCC)  Moderate protein-calorie malnutrition (HCC)  Simple chronic bronchitis (HCC)  Impaired mobility and ADLs  Closed displaced transcondylar fracture of right  humerus with routine healing, subsequent encounter  She is gaining weight which I am thrilled about.  Continue regular meals.  Uncertain as to what happened with that mirtazapine and if it was efficacious or not but she certainly has not been getting it as of late.  Breztri worked well and so I have encouraged her to continue on that regardless of symptomology and again reiterated the albuterol as intended for as needed use  I have completed the personal care services form and placed on Cathy's desk for faxing.   Raliegh Ip, DO [facility] [phone]

## 2022-10-20 NOTE — Progress Notes (Signed)
Requested Prescriptions   Signed Prescriptions Disp Refills   HYDROcodone-acetaminophen (NORCO/VICODIN) 5-325 MG tablet 20 tablet 0    Sig: Take 1 tablet by mouth every 6 (six) hours as needed for up to 5 days.

## 2022-10-28 ENCOUNTER — Ambulatory Visit (INDEPENDENT_AMBULATORY_CARE_PROVIDER_SITE_OTHER): Payer: 59 | Admitting: Orthopedic Surgery

## 2022-10-28 ENCOUNTER — Encounter: Payer: Self-pay | Admitting: Orthopedic Surgery

## 2022-10-28 ENCOUNTER — Other Ambulatory Visit: Payer: Self-pay | Admitting: Orthopedic Surgery

## 2022-10-28 ENCOUNTER — Other Ambulatory Visit (INDEPENDENT_AMBULATORY_CARE_PROVIDER_SITE_OTHER): Payer: 59

## 2022-10-28 DIAGNOSIS — S52591A Other fractures of lower end of right radius, initial encounter for closed fracture: Secondary | ICD-10-CM | POA: Diagnosis not present

## 2022-10-28 DIAGNOSIS — S42471A Displaced transcondylar fracture of right humerus, initial encounter for closed fracture: Secondary | ICD-10-CM

## 2022-10-28 DIAGNOSIS — S52591D Other fractures of lower end of right radius, subsequent encounter for closed fracture with routine healing: Secondary | ICD-10-CM

## 2022-10-28 MED ORDER — HYDROCODONE-ACETAMINOPHEN 5-325 MG PO TABS: 1.0000 | ORAL_TABLET | Freq: Three times a day (TID) | ORAL | 0 refills | Status: DC | PRN

## 2022-10-28 NOTE — Progress Notes (Signed)
Meds ordered this encounter  Medications   HYDROcodone-acetaminophen (NORCO/VICODIN) 5-325 MG tablet    Sig: Take 1 tablet by mouth every 8 (eight) hours as needed for up to 5 days.    Dispense:  15 tablet    Refill:  0

## 2022-10-28 NOTE — Telephone Encounter (Signed)
As they were leaving asked for Hydrocodone

## 2022-10-28 NOTE — Progress Notes (Signed)
Fracture care follow-up Chief Complaint  Patient presents with   Arm Injury    Right elbow and right wrist fractures 10/11/22    Encounter Diagnoses  Name Primary?   Other closed fracture of distal end of right radius with routine healing, subsequent encounter Yes   Closed displaced transcondylar fracture of right humerus, initial encounter    The patient is doing well she was placed in a long-arm cast after today's x-rays show nondisplaced fractures of the transchondral area of the right elbow and nondisplaced fracture of the right distal radius  Follow-up will be in 2 weeks x-rays out of plaster right wrist right elbow

## 2022-11-02 ENCOUNTER — Other Ambulatory Visit: Payer: Self-pay

## 2022-11-02 ENCOUNTER — Other Ambulatory Visit: Payer: Self-pay | Admitting: Orthopedic Surgery

## 2022-11-02 MED ORDER — HYDROCODONE-ACETAMINOPHEN 5-325 MG PO TABS: 1.0000 | ORAL_TABLET | Freq: Three times a day (TID) | ORAL | 0 refills | Status: AC | PRN

## 2022-11-02 NOTE — Telephone Encounter (Signed)
It shows print Will you resend for her to [facility]

## 2022-11-02 NOTE — Telephone Encounter (Signed)
Patient called stating that Dr. Romeo Apple was suppose to have sent her a prescription in to her pharmacy. The pharmacy hasn't received anything and in checking I see where it says it was printed.  PATIENT USES THE DRUG STORE IN [place]

## 2022-11-02 NOTE — Progress Notes (Signed)
Requested Prescriptions   Signed Prescriptions Disp Refills   HYDROcodone-acetaminophen (NORCO/VICODIN) 5-325 MG tablet 15 tablet 0    Sig: Take 1 tablet by mouth every 8 (eight) hours as needed for up to 5 days.

## 2022-11-09 DIAGNOSIS — S42471D Displaced transcondylar fracture of right humerus, subsequent encounter for fracture with routine healing: Secondary | ICD-10-CM | POA: Insufficient documentation

## 2022-11-09 DIAGNOSIS — S52501D Unspecified fracture of the lower end of right radius, subsequent encounter for closed fracture with routine healing: Secondary | ICD-10-CM | POA: Insufficient documentation

## 2022-11-11 ENCOUNTER — Other Ambulatory Visit (INDEPENDENT_AMBULATORY_CARE_PROVIDER_SITE_OTHER): Payer: 59

## 2022-11-11 ENCOUNTER — Ambulatory Visit: Payer: 59 | Admitting: Orthopedic Surgery

## 2022-11-11 DIAGNOSIS — S42471D Displaced transcondylar fracture of right humerus, subsequent encounter for fracture with routine healing: Secondary | ICD-10-CM | POA: Diagnosis not present

## 2022-11-11 DIAGNOSIS — S52591D Other fractures of lower end of right radius, subsequent encounter for closed fracture with routine healing: Secondary | ICD-10-CM

## 2022-11-11 NOTE — Progress Notes (Signed)
Chief Complaint  Patient presents with   Wrist Injury    Right 10/11/22     Elbow Injury    Right 10/11/22   Days since injury : 31  .Fracture care follow-up     Chief Complaint  Patient presents with   Arm Injury      Right elbow and right wrist fractures 10/11/22          Encounter Diagnoses  Name Primary?   Other closed fracture of distal end of right radius with routine healing, subsequent encounter Yes   Closed displaced transcondylar fracture of right humerus, initial encounter       Elbow and wrist x-rays today  The right elbow is healing appropriately and the cast can be removed.  The wrist is healing well as well and can be removed but bracing for 4 weeks is not  X-ray in 4 wks  Last visit The patient is doing well she was placed in a long-arm cast after today's x-rays show nondisplaced fractures of the transchondral area of the right elbow and nondisplaced fracture of the right distal radius   Follow-up will be in 2 weeks x-rays out of plaster right wrist right elbow

## 2022-11-13 ENCOUNTER — Encounter (HOSPITAL_COMMUNITY): Payer: Self-pay

## 2022-11-13 ENCOUNTER — Emergency Department (HOSPITAL_COMMUNITY): Payer: 59

## 2022-11-13 ENCOUNTER — Emergency Department (HOSPITAL_COMMUNITY)
Admission: EM | Admit: 2022-11-13 | Discharge: 2022-11-13 | Disposition: A | Discharge status: Home / Self Care | Payer: 59 | Attending: Emergency Medicine | Admitting: Emergency Medicine

## 2022-11-13 ENCOUNTER — Other Ambulatory Visit: Payer: Self-pay

## 2022-11-13 DIAGNOSIS — S41112A Laceration without foreign body of left upper arm, initial encounter: Secondary | ICD-10-CM | POA: Insufficient documentation

## 2022-11-13 DIAGNOSIS — S41111A Laceration without foreign body of right upper arm, initial encounter: Secondary | ICD-10-CM | POA: Insufficient documentation

## 2022-11-13 DIAGNOSIS — D649 Anemia, unspecified: Secondary | ICD-10-CM | POA: Insufficient documentation

## 2022-11-13 DIAGNOSIS — W108XXA Fall (on) (from) other stairs and steps, initial encounter: Secondary | ICD-10-CM | POA: Diagnosis not present

## 2022-11-13 DIAGNOSIS — S71111A Laceration without foreign body, right thigh, initial encounter: Secondary | ICD-10-CM | POA: Insufficient documentation

## 2022-11-13 DIAGNOSIS — S51811A Laceration without foreign body of right forearm, initial encounter: Secondary | ICD-10-CM | POA: Insufficient documentation

## 2022-11-13 DIAGNOSIS — F0390 Unspecified dementia without behavioral disturbance: Secondary | ICD-10-CM | POA: Diagnosis not present

## 2022-11-13 DIAGNOSIS — M542 Cervicalgia: Secondary | ICD-10-CM | POA: Insufficient documentation

## 2022-11-13 DIAGNOSIS — S329XXA Fracture of unspecified parts of lumbosacral spine and pelvis, initial encounter for closed fracture: Secondary | ICD-10-CM | POA: Diagnosis not present

## 2022-11-13 DIAGNOSIS — Z7901 Long term (current) use of anticoagulants: Secondary | ICD-10-CM | POA: Diagnosis not present

## 2022-11-13 DIAGNOSIS — S0083XA Contusion of other part of head, initial encounter: Secondary | ICD-10-CM | POA: Insufficient documentation

## 2022-11-13 DIAGNOSIS — S3993XA Unspecified injury of pelvis, initial encounter: Secondary | ICD-10-CM | POA: Diagnosis present

## 2022-11-13 DIAGNOSIS — Z8673 Personal history of transient ischemic attack (TIA), and cerebral infarction without residual deficits: Secondary | ICD-10-CM | POA: Diagnosis not present

## 2022-11-13 LAB — COMPREHENSIVE METABOLIC PANEL
ALT: 11 U/L (ref 0–44)
AST: 10 U/L — ABNORMAL LOW (ref 15–41)
Albumin: 3.3 g/dL — ABNORMAL LOW (ref 3.5–5.0)
Alkaline Phosphatase: 90 U/L (ref 38–126)
Anion gap: 9 (ref 5–15)
BUN: 28 mg/dL — ABNORMAL HIGH (ref 8–23)
CO2: 20 mmol/L — ABNORMAL LOW (ref 22–32)
Calcium: 8.4 mg/dL — ABNORMAL LOW (ref 8.9–10.3)
Chloride: 113 mmol/L — ABNORMAL HIGH (ref 98–111)
Creatinine, Ser: 3.36 mg/dL — ABNORMAL HIGH (ref 0.44–1.00)
GFR, Estimated: 13 mL/min — ABNORMAL LOW (ref >60)
Glucose, Bld: 110 mg/dL — ABNORMAL HIGH (ref 70–99)
Potassium: 4.2 mmol/L (ref 3.5–5.1)
Sodium: 142 mmol/L (ref 135–145)
Total Bilirubin: 0.4 mg/dL (ref 0.3–1.2)
Total Protein: 5.9 g/dL — ABNORMAL LOW (ref 6.5–8.1)

## 2022-11-13 LAB — SAMPLE TO BLOOD BANK

## 2022-11-13 LAB — PROTIME-INR
INR: 1.2 (ref 0.8–1.2)
Prothrombin Time: 15.6 s — ABNORMAL HIGH (ref 11.4–15.2)

## 2022-11-13 LAB — POC OCCULT BLOOD, ED: Fecal Occult Bld: NEGATIVE

## 2022-11-13 LAB — CBC
HCT: 25.4 % — ABNORMAL LOW (ref 36.0–46.0)
Hemoglobin: 7.3 g/dL — ABNORMAL LOW (ref 12.0–15.0)
MCH: 29.6 pg (ref 26.0–34.0)
MCHC: 28.7 g/dL — ABNORMAL LOW (ref 30.0–36.0)
MCV: 102.8 fL — ABNORMAL HIGH (ref 80.0–100.0)
Platelets: 260 10*3/uL (ref 150–400)
RBC: 2.47 MIL/uL — ABNORMAL LOW (ref 3.87–5.11)
RDW: 15.8 % — ABNORMAL HIGH (ref 11.5–15.5)
WBC: 8.1 10*3/uL (ref 4.0–10.5)
nRBC: 0 % (ref 0.0–0.2)

## 2022-11-13 LAB — LACTIC ACID, PLASMA: Lactic Acid, Venous: 1.1 mmol/L (ref 0.5–1.9)

## 2022-11-13 LAB — ETHANOL: Alcohol, Ethyl (B): <10 mg/dL (ref <10)

## 2022-11-13 MED ORDER — ACETAMINOPHEN 325 MG PO TABS: 650.0000 mg | ORAL_TABLET | Freq: Four times a day (QID) | ORAL | Status: DC | PRN

## 2022-11-13 MED ORDER — OXYCODONE-ACETAMINOPHEN 5-325 MG PO TABS
1.0000 | ORAL_TABLET | Freq: Three times a day (TID) | ORAL | 0 refills | Status: DC | PRN
Start: 2022-11-13 — End: 2023-12-26

## 2022-11-13 NOTE — ED Provider Notes (Signed)
[name] Provider Note   CSN: [id number] Arrival date & time: 11/13/22  1804     History  Chief Complaint  Patient presents with   [name]    Robin [name] is a 82 y.o. female past medical history of TIA (on Eliquis), dementia presents emergency department with daughter following slipping down 3 stairs after attempting to move chair on the porch.  Fall was witnessed by her daughter who denies LOC, seizure, AMS following fall.  She reports that she was able to walk following fall without difficulty. She complains of right medial thigh pain, posterior head pain, neck pain and multiple skin tears.  She sustained a few drops of blood on her shirt with no pooling of blood.  Patient denies complaints of dizziness, visual disturbance, weakness prior or following fall.  Of note, patient lives at home with daughter and her family to manage her medication and health care.  Daughter reports that they check for stool frequently and has not seen hematochezia, stool abnormalities.   Fall Associated symptoms include headaches. Pertinent negatives include no chest pain, no abdominal pain and no shortness of breath.      Home Medications Prior to Admission medications   Medication Sig Start Date End Date Taking? Authorizing Provider  oxyCODONE-acetaminophen (PERCOCET/ROXICET) 5-325 MG tablet Take 1 tablet by mouth every 8 (eight) hours as needed for severe pain. 11/13/22  Yes Eber Hong, MD  albuterol (VENTOLIN HFA) 108 (90 Base) MCG/ACT inhaler Inhale 2 puffs into the lungs every 6 (six) hours as needed for wheezing or shortness of breath. 08/30/22   St Santa Lighter, Dois Davenport, NP  apixaban (ELIQUIS) 5 MG TABS tablet Take 1 tablet (5 mg total) by mouth 2 (two) times daily. 06/29/22   Raliegh Ip, DO  Budeson-Glycopyrrol-Formoterol (BREZTRI AEROSPHERE) 160-9-4.8 MCG/ACT AERO Inhale 2 puffs into the lungs 2 (two) times daily. Patient taking  differently: Inhale 2 puffs into the lungs as needed (out of breath). 09/14/22   Raliegh Ip, DO  loratadine (CLARITIN) 10 MG tablet Take 1 tablet every OTHER day. Renal dosing 09/09/22   Delynn Flavin M, DO  ondansetron (ZOFRAN) 4 MG tablet Take 1 tablet (4 mg total) by mouth every 8 (eight) hours as needed for nausea or vomiting. 10/11/22   Vanetta Mulders, MD  ondansetron (ZOFRAN) 4 MG tablet Take 1 tablet (4 mg total) by mouth every 8 (eight) hours as needed for nausea or vomiting. 10/11/22   Vanetta Mulders, MD      Allergies    Hydromorphone, Hydromorphone hcl, Dilaudid [hydromorphone hcl], Lisinopril, and Valium [diazepam]    Review of Systems   Review of Systems  Constitutional:  Negative for chills, fatigue and fever.  HENT:  Negative for ear pain and sore throat.   Eyes:  Negative for pain and visual disturbance.  Respiratory:  Negative for cough, chest tightness, shortness of breath and wheezing.   Cardiovascular:  Negative for chest pain and palpitations.  Gastrointestinal:  Negative for abdominal pain, constipation, diarrhea, nausea and vomiting.  Genitourinary:  Negative for dysuria and hematuria.  Musculoskeletal:  Positive for arthralgias and neck pain. Negative for back pain.  Skin:  Positive for wound. Negative for color change and rash.  Neurological:  Positive for headaches. Negative for dizziness, seizures, syncope, weakness, light-headedness and numbness.  All other systems reviewed and are negative.   Physical Exam Updated Vital Signs BP 117/69   Pulse 70   Temp (!) 97.5 F (36.4  C) (Oral)   Resp (!) 21   Ht 5\' 4"  (1.626 m)   Wt 40.2 kg   SpO2 100%   BMI 15.21 kg/m  Physical Exam Vitals and nursing note reviewed.  Constitutional:      General: She is not in acute distress.    Appearance: Normal appearance.  HENT:     Head:     Comments: Hematoma to posterior cranium No Battle sign nor periorbital ecchymosis    Ears:     Comments: No  hemorrhage or CSF from ears bilaterally    Nose: Nose normal.     Comments: No epistaxis    Mouth/Throat:     Mouth: Mucous membranes are moist.     Comments: No break in skin integrity of tongue, nor hemorrhage noted from mouth Eyes:     General: No scleral icterus.       Right eye: No discharge.        Left eye: No discharge.     Extraocular Movements: Extraocular movements intact.     Conjunctiva/sclera: Conjunctivae normal.     Pupils: Pupils are equal, round, and reactive to light.     Comments: No fluid leakage, teardrop pupil, hyphema, subconjunctival hemorrhage  Neck:     Comments: C-collar applied by EMS  No crepitus, step-off, deformity to cervical neck or spine TTP of cervical neck Cardiovascular:     Rate and Rhythm: Normal rate.     Pulses: Normal pulses.     Comments: Radial pulses 2+ and equal Dorsalis pedis pulses 2+ and equal Pulmonary:     Effort: Pulmonary effort is normal. No respiratory distress.     Breath sounds: Normal breath sounds. No wheezing.  Abdominal:     General: There is no distension.     Palpations: Abdomen is soft. There is no mass.     Tenderness: There is no abdominal tenderness. There is no guarding.     Comments: No rigidity nor ecchymosis noted abdomen  Musculoskeletal:     Cervical back: Tenderness (Midspinous) present.     Right lower leg: No edema.     Left lower leg: No edema.     Comments: Pelvis stable with no TTP No internal or external rotation of Les bilaterally TTP of right medial thigh LE and UE strength and ROM equal No swelling to joints nor tissues surrounding long bones Ambulated without difficulty  Skin:    Capillary Refill: Capillary refill takes less than 2 seconds.     Coloration: Skin is not jaundiced or pale.     Findings: No bruising.     Comments: Superficial skin tears on left bicep, right upper arm, right lower arm, right thigh with no active hemorrhage  Neurological:     General: No focal deficit  present.     Mental Status: She is alert and oriented to person, place, and time. Mental status is at baseline.     Cranial Nerves: No cranial nerve deficit.     Coordination: Coordination normal.     Gait: Gait normal.     Deep Tendon Reflexes: Reflexes normal.     Comments: No nystagmus Equal sensation and motor function of UEs and LEs     ED Results / Procedures / Treatments   Labs (all labs ordered are listed, but only abnormal results are displayed) Labs Reviewed  COMPREHENSIVE METABOLIC PANEL - Abnormal; Notable for the following components:      Result Value   Chloride 113 (*)  CO2 20 (*)    Glucose, Bld 110 (*)    BUN 28 (*)    Creatinine, Ser 3.36 (*)    Calcium 8.4 (*)    Total Protein 5.9 (*)    Albumin 3.3 (*)    AST 10 (*)    GFR, Estimated 13 (*)    All other components within normal limits  CBC - Abnormal; Notable for the following components:   RBC 2.47 (*)    Hemoglobin 7.3 (*)    HCT 25.4 (*)    MCV 102.8 (*)    MCHC 28.7 (*)    RDW 15.8 (*)    All other components within normal limits  PROTIME-INR - Abnormal; Notable for the following components:   Prothrombin Time 15.6 (*)    All other components within normal limits  ETHANOL  LACTIC ACID, PLASMA  URINALYSIS, ROUTINE W REFLEX MICROSCOPIC  OCCULT BLOOD X 1 CARD TO LAB, STOOL  POC OCCULT BLOOD, ED  I-STAT CHEM 8, ED  SAMPLE TO BLOOD BANK    EKG None  Radiology DG Pelvis Portable  Result Date: 11/13/2022 CLINICAL DATA:  Trauma EXAM: PORTABLE PELVIS 1-2 VIEWS COMPARISON:  X-ray pelvis 03/22/2022 FINDINGS: Acute minimally displaced right superior pubic rami fracture. Acute, likely comminuted, minimally displaced right inferior pubic rami fracture. Total right hip arthroplasty. No surgical hardware complication noted on radiograph. Severe degenerative changes of the left hip. No pelvic bone lesions are seen. Degenerative changes of the visualized lower lumbar spine. Vascular calcifications.  IMPRESSION: 1. Acute minimally displaced right superior pubic rami fracture. 2. Acute, likely comminuted, minimally displaced right inferior pubic rami fracture. Electronically Signed   By: Tish Frederickson M.D.   On: 11/13/2022 20:30   DG Chest Port 1 View  Result Date: 11/13/2022 CLINICAL DATA:  Trauma EXAM: PORTABLE CHEST 1 VIEW COMPARISON:  CT C-spine 11/13/2022, chest x-ray 08/21/2022 FINDINGS: The heart and mediastinal contours are unchanged. Aortic calcification. Hyperinflation of the lungs. Biapical pleural/pulmonary scarring. No focal consolidation. No pulmonary edema. No pleural effusion. No pneumothorax. No acute osseous abnormality. IMPRESSION: 1. No active disease. 2.  Aortic Atherosclerosis (ICD10-I70.0). Electronically Signed   By: Tish Frederickson M.D.   On: 11/13/2022 20:28   DG Femur Min 2 Views Right  Result Date: 11/13/2022 CLINICAL DATA:  right thigh pain EXAM: RIGHT FEMUR 2 VIEWS COMPARISON:  None Available. FINDINGS: Total right hip arthroplasty. No radiographic findings suggest surgical hardware complication. There is no evidence of fracture or other focal bone lesions of the fever. Soft tissues are unremarkable. Severe vascular calcifications. Please see separately dictated x-ray pelvis 11/13/2022 for question right superior in inferior pubic rami irregularity. IMPRESSION: 1. Severe vascular calcification. 2.  No acute displaced fracture or dislocation of the right femur. 3. Total right hip arthroplasty. 4. Please see separately dictated x-ray pelvis 11/13/2022 for question right superior in inferior pubic rami irregularity. Electronically Signed   By: Tish Frederickson M.D.   On: 11/13/2022 20:25   CT Head Wo Contrast  Result Date: 11/13/2022 CLINICAL DATA:  Head trauma, minor (Age >= 65y); Neck trauma (Age >= 65y) Fall EXAM: CT HEAD WITHOUT CONTRAST CT CERVICAL SPINE WITHOUT CONTRAST TECHNIQUE: Multidetector CT imaging of the head and cervical spine was performed following the  standard protocol without intravenous contrast. Multiplanar CT image reconstructions of the cervical spine were also generated. RADIATION DOSE REDUCTION: This exam was performed according to the departmental dose-optimization program which includes automated exposure control, adjustment of the mA and/or kV according to patient  size and/or use of iterative reconstruction technique. COMPARISON:  MRA head 09/08/2018, CT head 09/06/2018, CT C-spine 02/22/2018 FINDINGS: CT HEAD FINDINGS Brain: Trace patchy and confluent areas of decreased attenuation are noted throughout the deep and periventricular Dovel matter of the cerebral hemispheres bilaterally, compatible with chronic microvascular ischemic disease. no evidence of large-territorial acute infarction. No parenchymal hemorrhage. No mass lesion. No extra-axial collection. No mass effect or midline shift. No hydrocephalus. Basilar cisterns are patent. Vascular: No hyperdense vessel. Atherosclerotic calcifications are present within the cavernous internal carotid and vertebral arteries. Skull: No acute fracture or focal lesion. Sinuses/Orbits: Paranasal sinuses and mastoid air cells are clear. Bilateral lens replacement. Otherwise the orbits are unremarkable. Other: None. CT CERVICAL SPINE FINDINGS Alignment: Stable mild retrolisthesis of C2 on C3, C3 on C4. Interval development of grade 1 anterolisthesis of C4 on C5. Skull base and vertebrae: Multilevel severe degenerative changes spine most prominent at the C3-C4 levels. No acute fracture. No aggressive appearing focal osseous lesion or focal pathologic process. Soft tissues and spinal canal: No prevertebral fluid or swelling. No visible canal hematoma. Upper chest: Biapical pleural/pulmonary scarring. Other: Patient is edentulous. IMPRESSION: 1. No acute intracranial abnormality. 2. No acute displaced fracture or traumatic listhesis of the cervical spine. Electronically Signed   By: Tish Frederickson M.D.   On:  11/13/2022 20:23   CT Cervical Spine Wo Contrast  Result Date: 11/13/2022 CLINICAL DATA:  Head trauma, minor (Age >= 65y); Neck trauma (Age >= 65y) Fall EXAM: CT HEAD WITHOUT CONTRAST CT CERVICAL SPINE WITHOUT CONTRAST TECHNIQUE: Multidetector CT imaging of the head and cervical spine was performed following the standard protocol without intravenous contrast. Multiplanar CT image reconstructions of the cervical spine were also generated. RADIATION DOSE REDUCTION: This exam was performed according to the departmental dose-optimization program which includes automated exposure control, adjustment of the mA and/or kV according to patient size and/or use of iterative reconstruction technique. COMPARISON:  MRA head 09/08/2018, CT head 09/06/2018, CT C-spine 02/22/2018 FINDINGS: CT HEAD FINDINGS Brain: Trace patchy and confluent areas of decreased attenuation are noted throughout the deep and periventricular Liera matter of the cerebral hemispheres bilaterally, compatible with chronic microvascular ischemic disease. no evidence of large-territorial acute infarction. No parenchymal hemorrhage. No mass lesion. No extra-axial collection. No mass effect or midline shift. No hydrocephalus. Basilar cisterns are patent. Vascular: No hyperdense vessel. Atherosclerotic calcifications are present within the cavernous internal carotid and vertebral arteries. Skull: No acute fracture or focal lesion. Sinuses/Orbits: Paranasal sinuses and mastoid air cells are clear. Bilateral lens replacement. Otherwise the orbits are unremarkable. Other: None. CT CERVICAL SPINE FINDINGS Alignment: Stable mild retrolisthesis of C2 on C3, C3 on C4. Interval development of grade 1 anterolisthesis of C4 on C5. Skull base and vertebrae: Multilevel severe degenerative changes spine most prominent at the C3-C4 levels. No acute fracture. No aggressive appearing focal osseous lesion or focal pathologic process. Soft tissues and spinal canal: No  prevertebral fluid or swelling. No visible canal hematoma. Upper chest: Biapical pleural/pulmonary scarring. Other: Patient is edentulous. IMPRESSION: 1. No acute intracranial abnormality. 2. No acute displaced fracture or traumatic listhesis of the cervical spine. Electronically Signed   By: Tish Frederickson M.D.   On: 11/13/2022 20:23    Procedures Procedures    Medications Ordered in ED Medications - No data to display  ED Course/ Medical Decision Making/ A&P  Medical Decision Making Amount and/or Complexity of Data Reviewed Labs: ordered. Radiology: ordered.  Risk Prescription drug management.    Patient presents to the ED for concern of pain following a fall, this involves an extensive number of treatment options, and is a complaint that carries with it a high risk of complications and morbidity.   Co morbidities that complicate the patient evaluation  None   Additional history obtained:  Additional history obtained from Nursing, Outside Medical Records, and Past Admission   External records from outside source obtained upon chart review  Lab Tests:  I Ordered, and personally interpreted labs.  The pertinent results include: Hemoglobin of 7.3, creatinine 3.36 per baseline Occult stool negative   Imaging Studies ordered:  I ordered imaging studies including CT head without contrast, CT neck, chest x-ray, pelvis x-ray, right femur x-ray I independently visualized and interpreted imaging which showed no acute intracranial pathology, right femur fracture, acute cardiopulmonary pathology X-ray of the pelvis significant for acute minimally displaced right superior pubic rami fracture and acute likely comminuted minimally displaced right inferior pubic rami fracture I agree with the radiologist interpretation   Cardiac Monitoring:  The patient was maintained on a cardiac monitor.  I personally viewed and interpreted the cardiac monitored  which showed an underlying rhythm of: Normal sinus rhythm with no STE nor ischemia   Medicines ordered and prescription drug management:  Dr. Hyacinth Meeker ordered  prescribed oxycodone for pain management  Reevaluation of the patient after these medicines showed that the patient improved I have reviewed the patients home medicines and have made adjustments as needed    Problem List / ED Course:  Fall, initial encounter   Reevaluation:  After the interventions noted above, I reevaluated the patient and found that they have :stayed the same    Dispostion:  Patient is resting comfortably in bed with c-collar applied with complaints of neck pain.  VS WNL.  See HPI.  Upon evaluation, patient has a hematoma to posterior cranium skull.  She denies blurred vision, visual disturbance, headache, nystagmus at this time.  Cranial nerves II through XII intact.  EOM intact without pain.  Patient has mild TTP to cervical neck spinous processes.  No stiffness, crepitus, deformity noted to neck or spine.  She denies abdominal pain.  She also complains of right thigh pain with no crepitus, deformity noted to right femur.  No internal nor external rotation of LEs bilaterally.  Pelvis stable.  dorsalis pedis 2+ and equal.  Sensation and motor equal to LEs and UEs.  There are superficial skin tears to left and right upper arm, left lower arm, right thigh.  No active hemorrhage.   Lab work is significant for hemoglobin of 7.3, chronically elevated creatinine (3.36).  Patient has chronic anemia and typically ranges between 7 and 9.  POC occult blood is negative.  Low hemoglobin is likely due to chronic anemia versus acute blood loss.  CT head negative for ICH.  Pelvis x-ray significant for minimally displaced superior and inferior pubic rami fracture.   After consideration of the diagnostic results and the patients response to treatment, I feel that the patent would benefit from outpatient management with home  health nurse and follow-up with Ortho.  Had extensive discussion with family who expresses that they would rather have PT at home. They are very reliable with patient's care.  Discussed that they should follow-up with outpatient PCP regarding chronic anemia and recent hemoglobin reading of 7.3 as patient is not symptomatic.  Return to emergency department precautions to include but not limited to syncope, head injury, seizures, inability to manage pain or PT at home. Dr. Hyacinth Meeker prescribed Percocet in ED and sent to pharmacy for pain management. Discussed lab work, low hemoglobin, imaging, treatment/disposition planning, return to emergency department precautions and they expressed understanding and agreed with plan.  Explained findings and treatment plan with Dr. Hyacinth Meeker who agrees with plan        Final Clinical Impression(s) / ED Diagnoses Final diagnoses:  Closed nondisplaced fracture of pelvis, unspecified part of pelvis, initial encounter (HCC)  Anemia, unspecified type    Rx / DC Orders ED Discharge Orders          Ordered    Home Health        11/13/22 2115    Face-to-face encounter (required for Medicare/Medicaid patients)       Comments: I Judithann Sheen certify that this patient is under my care and that I, or a nurse practitioner or physician's assistant working with me, had a face-to-face encounter that meets the physician face-to-face encounter requirements with this patient on 11/13/2022. The encounter with the patient was in whole, or in part for the following medical condition(s) which is the primary reason for home health care (List medical condition): pelvic fracture of 2 parts of pelvic fracture. Bedbound for first two days following ER   11/13/22 2115    oxyCODONE-acetaminophen (PERCOCET/ROXICET) 5-325 MG tablet  Every 8 hours PRN        11/13/22 2117              Judithann Sheen, PA 11/13/22 2135    Eber Hong, MD 11/14/22 1106

## 2022-11-13 NOTE — Discharge Instructions (Addendum)
Thank you for letting us take care of you today.  Your lab work was significant for hemoglobin of 7.3.  Please follow-up with primary care provider regarding these results.  There are 2 points of fracture and pelvis.  I put in an order for home health nurse and PT who should give you a call within 2 days.  We have sent Percocet to your pharmacy to use every 8 hours as needed for pain.  Turn to emergency department if you have syncope, head injury, seizures, inability to manage pain or PT at home

## 2022-11-13 NOTE — ED Triage Notes (Signed)
Pt bib [facility]. Pt lost her balance and fell down the steps pt says 4 steps, his her head. Pt is on Eliquis pt states she takes it everyday, daughter told EMS that she doesn't take it every day that she only takes it when she needs it. Pt does have a knot to the back of her head. Denies n/v/dizziness.

## 2022-11-15 ENCOUNTER — Encounter: Payer: Self-pay | Admitting: Family Medicine

## 2022-11-15 ENCOUNTER — Ambulatory Visit (INDEPENDENT_AMBULATORY_CARE_PROVIDER_SITE_OTHER): Payer: 59 | Admitting: Family Medicine

## 2022-11-15 VITALS — BP 120/73 | HR 85 | Ht 64.0 in

## 2022-11-15 DIAGNOSIS — R296 Repeated falls: Secondary | ICD-10-CM | POA: Diagnosis not present

## 2022-11-15 DIAGNOSIS — R399 Unspecified symptoms and signs involving the genitourinary system: Secondary | ICD-10-CM

## 2022-11-15 DIAGNOSIS — S32501D Unspecified fracture of right pubis, subsequent encounter for fracture with routine healing: Secondary | ICD-10-CM | POA: Diagnosis not present

## 2022-11-15 DIAGNOSIS — S32509D Unspecified fracture of unspecified pubis, subsequent encounter for fracture with routine healing: Secondary | ICD-10-CM

## 2022-11-15 NOTE — Progress Notes (Signed)
BP 120/73   Pulse 85   Ht 5\' 4"  (1.626 m)   SpO2 100%   BMI 15.21 kg/m    Subjective:   Patient ID: Robin [name], female    DOB: 07-12-1940, 82 y.o.   MRN: [id number]  HPI: Robin [name] is a 82 y.o. female presenting on 11/15/2022 for Hospitalization Follow-up and Urinary Tract Infection   HPI Fall Patient comes in today for ER follow-up after a fall.  She was in the emergency department 2 days ago on 11/13/2022 and had a fall that resulted in hematoma to her posterior cranium and skin tears on left upper arm and right upper arm and right lower arm and right thigh.  She was diagnosed with acute minimally displaced right superior pubic rami fracture and right inferior pubic rami fracture.  They have not changed the dressing since the day they were seen in the emergency department.  She has had a couple falls in the past few months before this including a wrist fracture.   Relevant past medical, surgical, family and social history reviewed and updated as indicated. Interim medical history since our last visit reviewed. Allergies and medications reviewed and updated.  Review of Systems  Constitutional:  Negative for chills and fever.  Eyes:  Negative for visual disturbance.  Respiratory:  Negative for chest tightness and shortness of breath.   Cardiovascular:  Negative for chest pain and leg swelling.  Genitourinary:  Negative for difficulty urinating and dysuria.  Musculoskeletal:  Negative for back pain and gait problem.  Skin:  Positive for wound. Negative for color change and rash.  Neurological:  Negative for light-headedness and headaches.  Psychiatric/Behavioral:  Negative for agitation and behavioral problems.   All other systems reviewed and are negative.   Per HPI unless specifically indicated above   Allergies as of 11/15/2022       Reactions   Hydromorphone Other (See Comments)   Hydromorphone Hcl Nausea And Vomiting   Dilaudid [hydromorphone Hcl]  Nausea And Vomiting   Lisinopril Cough, Other (See Comments)   Valium [diazepam] Other (See Comments)   Family states patient has adverse reaction and displays confusion.         Medication List        Accurate as of November 15, 2022  3:07 PM. If you have any questions, ask your nurse or doctor.          albuterol 108 (90 Base) MCG/ACT inhaler Commonly known as: VENTOLIN HFA Inhale 2 puffs into the lungs every 6 (six) hours as needed for wheezing or shortness of breath.   apixaban 5 MG Tabs tablet Commonly known as: ELIQUIS Take 1 tablet (5 mg total) by mouth 2 (two) times daily.   Breztri Aerosphere 160-9-4.8 MCG/ACT Aero Generic drug: Budeson-Glycopyrrol-Formoterol Inhale 2 puffs into the lungs 2 (two) times daily. What changed:  when to take this reasons to take this   loratadine 10 MG tablet Commonly known as: CLARITIN Take 1 tablet every OTHER day. Renal dosing   ondansetron 4 MG tablet Commonly known as: ZOFRAN Take 1 tablet (4 mg total) by mouth every 8 (eight) hours as needed for nausea or vomiting.   ondansetron 4 MG tablet Commonly known as: ZOFRAN Take 1 tablet (4 mg total) by mouth every 8 (eight) hours as needed for nausea or vomiting.   oxyCODONE-acetaminophen 5-325 MG tablet Commonly known as: PERCOCET/ROXICET Take 1 tablet by mouth every 8 (eight) hours as needed for severe pain.  Objective:   BP 120/73   Pulse 85   Ht 5\' 4"  (1.626 m)   SpO2 100%   BMI 15.21 kg/m   Wt Readings from Last 3 Encounters:  11/13/22 88 lb 10 oz (40.2 kg)  10/20/22 84 lb 6.4 oz (38.3 kg)  10/13/22 89 lb (40.4 kg)    Physical Exam Vitals and nursing note reviewed.  Constitutional:      General: She is not in acute distress.    Appearance: She is well-developed. She is not diaphoretic.  Eyes:     Conjunctiva/sclera: Conjunctivae normal.  Cardiovascular:     Rate and Rhythm: Normal rate and regular rhythm.     Heart sounds: Normal heart sounds.  No murmur heard. Pulmonary:     Effort: Pulmonary effort is normal. No respiratory distress.     Breath sounds: Normal breath sounds. No wheezing.  Musculoskeletal:        General: Tenderness (Pain in hips with range of motion) present.  Skin:    General: Skin is warm and dry.     Findings: Wound (Skin tear on posterior right upper arm and anterior right upper arm and posterior left upper arm) present.  Neurological:     Mental Status: She is alert and oriented to person, place, and time.     Coordination: Coordination normal.  Psychiatric:        Behavior: Behavior normal.     Daily wound care changes including nonstick gauze and rolled gauze.  Assessment & Plan:   Problem List Items Addressed This Visit   None Visit Diagnoses     Closed nondisplaced fracture of pubis with routine healing, unspecified laterality, subsequent encounter    -  Primary   Relevant Orders   Ambulatory referral to Home Health   UTI symptoms       Relevant Orders   Urinalysis, Complete   Urine Culture   Recurrent falls       Relevant Orders   Ambulatory referral to Home Health       Placed referral to home health, patient wants to leave a urine just in case she has some kind of infection going on, no major symptoms.  Daily wound care changes including nonstick gauze and rolled gauze Follow up plan: Return if symptoms worsen or fail to improve.  Counseling provided for all of the vaccine components Orders Placed This Encounter  Procedures   Urine Culture   Urinalysis, Complete   Ambulatory referral to Home Health    Arville Care, MD [facility] 11/15/2022, 3:07 PM

## 2022-11-16 ENCOUNTER — Other Ambulatory Visit: Payer: Self-pay

## 2022-11-16 DIAGNOSIS — R399 Unspecified symptoms and signs involving the genitourinary system: Secondary | ICD-10-CM

## 2022-11-16 LAB — URINALYSIS, COMPLETE
Bilirubin, UA: NEGATIVE
Glucose, UA: NEGATIVE
Ketones, UA: NEGATIVE
Nitrite, UA: NEGATIVE
Specific Gravity, UA: 1.02 (ref 1.005–1.030)
Urobilinogen, Ur: 0.2 mg/dL (ref 0.2–1.0)
pH, UA: 5.5 (ref 5.0–7.5)

## 2022-11-16 LAB — MICROSCOPIC EXAMINATION

## 2022-11-22 ENCOUNTER — Other Ambulatory Visit: Payer: Self-pay | Admitting: Family Medicine

## 2022-11-22 ENCOUNTER — Ambulatory Visit (INDEPENDENT_AMBULATORY_CARE_PROVIDER_SITE_OTHER): Payer: 59 | Admitting: Orthopedic Surgery

## 2022-11-22 ENCOUNTER — Encounter: Payer: Self-pay | Admitting: Orthopedic Surgery

## 2022-11-22 VITALS — BP 120/73 | Ht 64.0 in | Wt 88.0 lb

## 2022-11-22 DIAGNOSIS — S32591A Other specified fracture of right pubis, initial encounter for closed fracture: Secondary | ICD-10-CM | POA: Diagnosis not present

## 2022-11-22 MED ORDER — CEPHALEXIN 500 MG PO CAPS: 500.0000 mg | ORAL_CAPSULE | Freq: Four times a day (QID) | ORAL | 0 refills | Status: DC

## 2022-11-22 NOTE — Progress Notes (Signed)
[name] comes in with a new problem  She left my office on October 3 after evaluation for a right distal radius fracture and right elbow fracture she fell on the fifth went to the ER x-rays showed a right sided pelvic fracture with no displacement  She has been ambulating normally  Problem list, medical hx, medications and allergies reviewed    Her exam shows normal range of motion of the right hip leg lengths are equal no areas of tenderness pelvis feels stable  X-rays were reviewed  Interpretation comminuted fracture right hemipelvis no displacement appears to involve the superior ramus inferior ramus  There is evidence also of a right total hip without complication no fracture  Encounter Diagnosis  Name Primary?   Closed fracture of ramus of right pubis, initial encounter (HCC) Yes    Recommend continue weightbearing as tolerated patient is asymptomatic x-ray in 6 weeks  CPT 27197

## 2022-11-24 LAB — URINE CULTURE

## 2022-12-09 ENCOUNTER — Encounter: Payer: 59 | Admitting: Orthopedic Surgery

## 2023-01-19 ENCOUNTER — Encounter: Payer: Self-pay | Admitting: Family Medicine

## 2023-01-19 ENCOUNTER — Ambulatory Visit (INDEPENDENT_AMBULATORY_CARE_PROVIDER_SITE_OTHER): Payer: 59 | Admitting: Family Medicine

## 2023-01-19 VITALS — BP 131/69 | HR 68 | Temp 98.7°F | Ht 64.0 in | Wt 84.6 lb

## 2023-01-19 DIAGNOSIS — N184 Chronic kidney disease, stage 4 (severe): Secondary | ICD-10-CM

## 2023-01-19 DIAGNOSIS — G3184 Mild cognitive impairment, so stated: Secondary | ICD-10-CM | POA: Diagnosis not present

## 2023-01-19 DIAGNOSIS — R296 Repeated falls: Secondary | ICD-10-CM | POA: Diagnosis not present

## 2023-01-19 DIAGNOSIS — E440 Moderate protein-calorie malnutrition: Secondary | ICD-10-CM

## 2023-01-19 NOTE — Progress Notes (Signed)
Subjective: CC: Chronic follow-up PCP: Robin Ip, DO QMV:HQION [place] Robin [name] is a 82 y.o. female presenting to clinic today for:  1.  MCI, malnourishment She is brought to the office by her other daughter today.  They report things are stable.  She did sustain a fall over the last couple of months that resulted in a closed nondisplaced fracture.  She has healed from that.  She seems to be very reluctant to use her walker regularly.  She reports good appetite.  However, may not be hydrating well  2.  CKD4 Has not followed up with nephrology in a bit.  No recent labs.  Reports good urine output.  No lower extremity swelling.  No chest pain.  No shortness of breath.  Denies any bleeding with Eliquis.    Allergies  Allergen Reactions   Hydromorphone Other (See Comments)   Hydromorphone Hcl Nausea And Vomiting   Dilaudid [Hydromorphone Hcl] Nausea And Vomiting   Lisinopril Cough and Other (See Comments)   Valium [Diazepam] Other (See Comments)    Family states patient has adverse reaction and displays confusion.    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 (Patient taking differently: Inhale 2 puffs into the lungs as needed (out of breath).), Disp: 10.7 g, Rfl: 11   cephALEXin (KEFLEX) 500 MG capsule, Take 1 capsule (500 mg total) by mouth 4 (four) times daily., Disp: 28  capsule, Rfl: 0   loratadine (CLARITIN) 10 MG tablet, Take 1 tablet every OTHER day. Renal dosing, Disp: 45 tablet, Rfl: 3   ondansetron (ZOFRAN) 4 MG tablet, Take 1 tablet (4 mg total) by mouth every 8 (eight) hours as needed for nausea or vomiting., Disp: , Rfl:    ondansetron (ZOFRAN) 4 MG tablet, Take 1 tablet (4 mg total) by mouth every 8 (eight) hours as needed for nausea or vomiting., Disp: 4 tablet, Rfl: 0   oxyCODONE-acetaminophen (PERCOCET/ROXICET) 5-325 MG tablet, Take 1 tablet by mouth every 8 (eight) hours as needed for severe pain., Disp: 15 tablet, Rfl: 0 Social History   Socioeconomic History   Marital status: Widowed    Spouse name: Not on file   Number of children: 4   Years of education: 63   Highest education level: 11th grade  Occupational History   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  Tobacco Use   Smoking status: Former    Current packs/day: 0.00    Average packs/day: 0.3 packs/day for 5.0 years (1.3 ttl pk-yrs)    Types: Cigarettes    Start date: 06/30/1972    Quit date: 06/30/1977    Years since quitting: 45.5   Smokeless tobacco: Never  Vaping Use   Vaping status: Never Used  Substance and Sexual Activity   Alcohol use: No   Drug  use: No   Sexual activity: Not Currently  Other Topics Concern   Not on file  Social History Narrative   Not on file   Social Determinants of Health   Financial Resource Strain: Low Risk  (05/18/2022)   Overall Financial Resource Strain (CARDIA)    Difficulty of Paying Living Expenses: Not hard at all  Food Insecurity: No Food Insecurity (05/18/2022)   Hunger Vital Sign    Worried About Running Out of Food in the Last Year: Never true    Ran Out of Food in the Last Year: Never true  Transportation Needs: No Transportation Needs (05/18/2022)   PRAPARE - Administrator, Civil Service (Medical): No    Lack of Transportation (Non-Medical): No  Physical Activity: Inactive  (05/18/2022)   Exercise Vital Sign    Days of Exercise per Week: 0 days    Minutes of Exercise per Session: 0 min  Stress: No Stress Concern Present (05/18/2022)   [employer] of Occupational Health - Occupational Stress Questionnaire    Feeling of Stress : Not at all  Social Connections: Socially Isolated (05/18/2022)   Social Connection and Isolation Panel [NHANES]    Frequency of Communication with Friends and Family: More than three times a week    Frequency of Social Gatherings with Friends and Family: More than three times a week    Attends Religious Services: Never    Database administrator or Organizations: No    Attends Banker Meetings: Never    Marital Status: Widowed  Intimate Partner Violence: Not At Risk (05/18/2022)   Humiliation, Afraid, Rape, and Kick questionnaire    Fear of Current or Ex-Partner: No    Emotionally Abused: No    Physically Abused: No    Sexually Abused: No   Family History  Problem Relation Age of Onset   Diabetes Mother    Diabetes Sister    Healthy Daughter    Healthy Son    Cancer Brother    Healthy Daughter     Objective: Office vital signs reviewed. BP 131/69   Pulse 68   Temp 98.7 F (37.1 C)   Ht 5\' 4"  (1.626 m)   Wt 84 lb 9.6 oz (38.4 kg)   SpO2 100%   BMI 14.52 kg/m   Physical Examination:  General: Awake, alert, underweight elderly female, No acute distress HEENT: sclera Robin [name] Cardio: regular rate and rhythm, S1S2 heard, no murmurs appreciated Pulm: clear to auscultation bilaterally, no wheezes, rhonchi or rales; normal work of breathing on room air  Assessment/ Plan: 82 y.o. female   CKD (chronic kidney disease) stage 4, GFR 15-29 ml/min (HCC) - Plan: Renal Function Panel, CBC, VITAMIN D 25 Hydroxy (Vit-D Deficiency, Fractures)  Mild cognitive impairment  Moderate protein-calorie malnutrition (HCC)  Recurrent falls  Check renal function.  Have CCed these to Dr. Wolfgang [name].  Encouraged follow-up with him  for ongoing surveillance and management  MCI is chronic and stable but sadly continues to have recurrent falls.  I reiterated need to be consistent with walker use and safe steps  Encouraged ongoing p.o. hydration, supplemental shakes along with scheduled meals to improve weight and muscle tone  May follow-up in 4 to 6 months, sooner if concerns arise   Robin Ip, DO [facility] [phone]

## 2023-01-20 LAB — RENAL FUNCTION PANEL
Albumin: 4.2 g/dL (ref 3.7–4.7)
BUN/Creatinine Ratio: 10 *Unknown — ABNORMAL LOW (ref 12–28)
BUN: 38 mg/dL — ABNORMAL HIGH (ref 8–27)
CO2: 16 mmol/L — ABNORMAL LOW (ref 20–29)
Calcium: 8.5 mg/dL — ABNORMAL LOW (ref 8.7–10.3)
Chloride: 111 mmol/L — ABNORMAL HIGH (ref 96–106)
Creatinine, Ser: 3.87 mg/dL (ref 0.57–1.00)
Glucose: 114 mg/dL — ABNORMAL HIGH (ref 70–99)
Phosphorus: 5.8 mg/dL — ABNORMAL HIGH (ref 3.0–4.3)
Potassium: 4.9 mmol/L (ref 3.5–5.2)
Sodium: 140 mmol/L (ref 134–144)
eGFR: 11 mL/min/{1.73_m2} — ABNORMAL LOW (ref >59)

## 2023-01-20 LAB — CBC
Hematocrit: 27 *Unknown — ABNORMAL LOW (ref 34.0–46.6)
Hemoglobin: 8.4 g/dL — CL (ref 11.1–15.9)
MCH: 30.1 pg (ref 26.6–33.0)
MCHC: 31.1 g/dL — ABNORMAL LOW (ref 31.5–35.7)
MCV: 97 fL (ref 79–97)
Platelets: 336 10*3/uL (ref 150–450)
RBC: 2.79 x10E6/uL — ABNORMAL LOW (ref 3.77–5.28)
RDW: 13.5 *Unknown (ref 11.7–15.4)
WBC: 6.6 10*3/uL (ref 3.4–10.8)

## 2023-01-20 LAB — VITAMIN D 25 HYDROXY (VIT D DEFICIENCY, FRACTURES): Vit D, 25-Hydroxy: 23.9 ng/mL — ABNORMAL LOW (ref 30.0–100.0)

## 2023-01-21 DIAGNOSIS — D3502 Benign neoplasm of left adrenal gland: Secondary | ICD-10-CM | POA: Diagnosis not present

## 2023-01-21 DIAGNOSIS — N185 Chronic kidney disease, stage 5: Secondary | ICD-10-CM | POA: Diagnosis not present

## 2023-01-21 DIAGNOSIS — D649 Anemia, unspecified: Secondary | ICD-10-CM | POA: Diagnosis not present

## 2023-01-21 DIAGNOSIS — E8722 Chronic metabolic acidosis: Secondary | ICD-10-CM | POA: Diagnosis not present

## 2023-01-27 ENCOUNTER — Telehealth: Payer: Self-pay

## 2023-01-27 ENCOUNTER — Other Ambulatory Visit: Payer: Self-pay | Admitting: Orthopedic Surgery

## 2023-01-27 MED ORDER — HYDROCODONE-ACETAMINOPHEN 5-325 MG PO TABS
1.0000 | ORAL_TABLET | Freq: Three times a day (TID) | ORAL | 0 refills | Status: AC | PRN
Start: 2023-01-27 — End: 2023-02-01

## 2023-01-27 NOTE — Progress Notes (Signed)
Meds ordered this encounter  Medications   HYDROcodone-acetaminophen (NORCO/VICODIN) 5-325 MG tablet    Sig: Take 1 tablet by mouth every 8 (eight) hours as needed for up to 5 days for moderate pain (pain score 4-6).    Dispense:  15 tablet    Refill:  0

## 2023-01-27 NOTE — Telephone Encounter (Signed)
I called patient to advise.

## 2023-01-27 NOTE — Telephone Encounter (Signed)
Hydrocodone-Acetaminophen  5/325 MG  Qty 15 Tablets   Take 1 tablet by mouth every 8 (eight) hours as needed for up to 5 days.    PATIENT USES THE DRUG STORE IN [place]

## 2023-03-04 ENCOUNTER — Encounter: Payer: Self-pay | Admitting: Family Medicine

## 2023-03-04 ENCOUNTER — Ambulatory Visit (INDEPENDENT_AMBULATORY_CARE_PROVIDER_SITE_OTHER): Payer: 59 | Admitting: Family Medicine

## 2023-03-04 VITALS — BP 125/75 | HR 75 | Temp 98.6°F | Ht 64.0 in | Wt 85.4 lb

## 2023-03-04 DIAGNOSIS — E440 Moderate protein-calorie malnutrition: Secondary | ICD-10-CM

## 2023-03-04 DIAGNOSIS — N185 Chronic kidney disease, stage 5: Secondary | ICD-10-CM | POA: Diagnosis not present

## 2023-03-04 DIAGNOSIS — N3000 Acute cystitis without hematuria: Secondary | ICD-10-CM

## 2023-03-04 DIAGNOSIS — R627 Adult failure to thrive: Secondary | ICD-10-CM

## 2023-03-04 LAB — URINALYSIS, ROUTINE W REFLEX MICROSCOPIC
Bilirubin, UA: NEGATIVE
Glucose, UA: NEGATIVE
Ketones, UA: NEGATIVE
Nitrite, UA: NEGATIVE
Specific Gravity, UA: 1.02 (ref 1.005–1.030)
Urobilinogen, Ur: 0.2 mg/dL (ref 0.2–1.0)
pH, UA: 5.5 (ref 5.0–7.5)

## 2023-03-04 LAB — MICROSCOPIC EXAMINATION
Renal Epithel, UA: NONE SEEN /[HPF]
Yeast, UA: NONE SEEN

## 2023-03-04 MED ORDER — CEPHALEXIN 250 MG PO CAPS: 250.0000 mg | ORAL_CAPSULE | Freq: Every day | ORAL | 0 refills | Status: AC

## 2023-03-04 NOTE — Progress Notes (Addendum)
 Subjective: CC: Belly swelling PCP: Eliodoro Guerin, DO ZOX:WRUEA Robin [name] is a 83 y.o. female presenting to clinic today for:  1.  Abdominal swelling Patient is to the office by her daughter who notes that yesterday she had some facial and abdominal swelling that came out of the blue.  She notes that she has had decreased appetite and has not been really drinking any fluids for the last several days.  Patient denies any pain with urination, blood in urine, fevers, nausea or vomiting.  She is under the care of nephrology for advanced renal disease and there are plans to have her see another specialist soon for additional treatment.  She did pee 1 time today but went almost the entire day yesterday without urinating at all.  Not sure that she has had any bowel movements.  ROS: Per HPI  Allergies  Allergen Reactions   Hydromorphone Other (See Comments)   Hydromorphone Hcl Nausea And Vomiting   Dilaudid [Hydromorphone Hcl] Nausea And Vomiting   Lisinopril Cough and Other (See Comments)   Valium [Diazepam] Other (See Comments)    Family states patient has adverse reaction and displays confusion.    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 (Patient taking differently: Inhale 2 puffs into the lungs as needed (out of breath).), Disp: 10.7 g, Rfl: 11   cephALEXin (KEFLEX) 500  MG capsule, Take 1 capsule (500 mg total) by mouth 4 (four) times daily., Disp: 28 capsule, Rfl: 0   loratadine (CLARITIN) 10 MG tablet, Take 1 tablet every OTHER day. Renal dosing, Disp: 45 tablet, Rfl: 3   ondansetron (ZOFRAN) 4 MG tablet, Take 1 tablet (4 mg total) by mouth every 8 (eight) hours as needed for nausea or vomiting., Disp: , Rfl:    ondansetron (ZOFRAN) 4 MG tablet, Take 1 tablet (4 mg total) by mouth every 8 (eight) hours as needed for nausea or vomiting., Disp: 4 tablet, Rfl: 0   oxyCODONE-acetaminophen (PERCOCET/ROXICET) 5-325 MG tablet, Take 1 tablet by mouth every 8 (eight) hours as needed for severe pain., Disp: 15 tablet, Rfl: 0 Social History   Socioeconomic History   Marital status: Widowed    Spouse name: Not on file   Number of children: 4   Years of education: 54   Highest education level: 11th grade  Occupational History   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  Tobacco Use   Smoking status: Former    Current packs/day: 0.00    Average packs/day: 0.3 packs/day for 5.0 years (1.3 ttl pk-yrs)    Types: Cigarettes    Start date: 06/30/1972    Quit date: 06/30/1977    Years since quitting: 45.7   Smokeless tobacco: Never  Vaping Use   Vaping status: Never Used  Substance and Sexual  Activity   Alcohol use: No   Drug use: No   Sexual activity: Not Currently  Other Topics Concern   Not on file  Social History Narrative   Not on file   Social Drivers of Health   Financial Resource Strain: Low Risk  (05/18/2022)   Overall Financial Resource Strain (CARDIA)    Difficulty of Paying Living Expenses: Not hard at all  Food Insecurity: No Food Insecurity (05/18/2022)   Hunger Vital Sign    Worried About Running Out of Food in the Last Year: Never true    Ran Out of Food in the Last Year: Never true  Transportation Needs: No Transportation Needs (05/18/2022)   PRAPARE - Administrator, Civil Service (Medical): No     Lack of Transportation (Non-Medical): No  Physical Activity: Inactive (05/18/2022)   Exercise Vital Sign    Days of Exercise per Week: 0 days    Minutes of Exercise per Session: 0 min  Stress: No Stress Concern Present (05/18/2022)   [employer] of Occupational Health - Occupational Stress Questionnaire    Feeling of Stress : Not at all  Social Connections: Socially Isolated (05/18/2022)   Social Connection and Isolation Panel [NHANES]    Frequency of Communication with Friends and Family: More than three times a week    Frequency of Social Gatherings with Friends and Family: More than three times a week    Attends Religious Services: Never    Database administrator or Organizations: No    Attends Banker Meetings: Never    Marital Status: Widowed  Intimate Partner Violence: Not At Risk (05/18/2022)   Humiliation, Afraid, Rape, and Kick questionnaire    Fear of Current or Ex-Partner: No    Emotionally Abused: No    Physically Abused: No    Sexually Abused: No   Family History  Problem Relation Age of Onset   Diabetes Mother    Diabetes Sister    Healthy Daughter    Healthy Son    Cancer Brother    Healthy Daughter     Objective: Office vital signs reviewed. BP 125/75   Pulse 75   Temp 98.6 F (37 C)   Ht 5\' 4"  (1.626 m)   Wt 85 lb 6.4 oz (38.7 kg)   SpO2 100%   BMI 14.66 kg/m   Physical Examination:  General: Awake, alert, thin female, No acute distress HEENT: sclera Murin, MMM Cardio: regular rate and rhythm, S1S2 heard, no murmurs appreciated Pulm: clear to auscultation bilaterally, no wheezes, rhonchi or rales; normal work of breathing on room air GI: soft, non-tender, non-distended  GU: no suprapubic TTP Extremities: no edema  Results for orders placed or performed in visit on 03/04/23 (from the past 24 hours)  Urinalysis, Routine w reflex microscopic     Status: Abnormal   Collection Time: 03/04/23  1:50 PM  Result Value Ref Range   Specific  Gravity, UA 1.020 1.005 - 1.030   pH, UA 5.5 5.0 - 7.5   Color, UA Yellow Yellow   Appearance Ur Clear Clear   Leukocytes,UA 2+ (A) Negative   Protein,UA 2+ (A) Negative/Trace   Glucose, UA Negative Negative   Ketones, UA Negative Negative   RBC, UA 1+ (A) Negative   Bilirubin, UA Negative Negative   Urobilinogen, Ur 0.2 0.2 - 1.0 mg/dL   Nitrite, UA Negative Negative   Microscopic Examination See below:    Narrative   Performed at:  01 -  [facility] [address], [place], [place]  [id number] Lab Director: Claudene Crystal [facility], Phone:  [phone]  Microscopic Examination     Status: Abnormal   Collection Time: 03/04/23  1:50 PM   Urine  Result Value Ref Range   WBC, UA 6-10 (A) 0 - 5 /hpf   RBC, Urine 0-2 0 - 2 /hpf   Epithelial Cells (non renal) 0-10 0 - 10 /hpf   Renal Epithel, UA None seen None seen /hpf   Bacteria, UA Few (A) None seen/Few   Yeast, UA None seen None seen   Narrative   Performed at:  [address] - [facility] [address] [place], [place]  [id number] Lab Director: Claudene Crystal [facility], Phone:  [phone]    Assessment/ Plan: 83 y.o. female   Chronic kidney disease (CKD), active medical management without dialysis, stage 5 (HCC) - Plan: Renal Function Panel, CBC, cephALEXin (KEFLEX) 250 MG capsule, Ambulatory referral to Hospice  Moderate protein-calorie malnutrition (HCC) - Plan: Renal Function Panel, CBC, Ambulatory referral to Hospice  Failure to thrive syndrome, adult - Plan: Renal Function Panel, CBC, Ambulatory referral to Hospice  Acute cystitis without hematuria - Plan: Urinalysis, Routine w reflex microscopic, Urine Culture, cephALEXin (KEFLEX) 250 MG capsule  Check renal function, CBC, urinalysis and urine culture.  Her physical was unremarkable and I cannot appreciate any swelling anywhere but given advanced renal disease and decreased urine output I am concerned.  Will CC results to Dr. Carrolyn Clan.  I did discuss with Ms. Lanz's daughter  about my concerns for overall failure to thrive and possible need to get palliative/hospice care involved as she is not a candidate for hemodialysis AV fistula placement  *update renally dosed keflex sent for GFR 11. **update, she declines all treatment for ESRD, Multiple Myeloma.  Help no longer available to her.  Needing help at home.  Referral to hospice placed.    Eliodoro Guerin, DO [facility] [phone]

## 2023-03-04 NOTE — Addendum Note (Signed)
Addended by: Raliegh Ip on: 03/04/2023 04:44 PM   Modules accepted: Orders

## 2023-03-05 LAB — RENAL FUNCTION PANEL
Albumin: 4.5 g/dL (ref 3.7–4.7)
BUN/Creatinine Ratio: 8 — ABNORMAL LOW (ref 12–28)
BUN: 35 mg/dL — ABNORMAL HIGH (ref 8–27)
CO2: 13 mmol/L — ABNORMAL LOW (ref 20–29)
Calcium: 8.9 mg/dL (ref 8.7–10.3)
Chloride: 110 mmol/L — ABNORMAL HIGH (ref 96–106)
Creatinine, Ser: 4.21 mg/dL (ref 0.57–1.00)
Glucose: 94 mg/dL (ref 70–99)
Phosphorus: 5.5 mg/dL — ABNORMAL HIGH (ref 3.0–4.3)
Potassium: 4.8 mmol/L (ref 3.5–5.2)
Sodium: 140 mmol/L (ref 134–144)
eGFR: 10 mL/min/{1.73_m2} — ABNORMAL LOW (ref >59)

## 2023-03-05 LAB — CBC
Hematocrit: 28.4 % — ABNORMAL LOW (ref 34.0–46.6)
Hemoglobin: 8.9 g/dL — CL (ref 11.1–15.9)
MCH: 30.3 pg (ref 26.6–33.0)
MCHC: 31.3 g/dL — ABNORMAL LOW (ref 31.5–35.7)
MCV: 97 fL (ref 79–97)
Platelets: 355 10*3/uL (ref 150–450)
RBC: 2.94 x10E6/uL — ABNORMAL LOW (ref 3.77–5.28)
RDW: 13.6 % (ref 11.7–15.4)
WBC: 7.5 10*3/uL (ref 3.4–10.8)

## 2023-03-06 LAB — URINE CULTURE

## 2023-03-08 ENCOUNTER — Inpatient Hospital Stay: Payer: 59

## 2023-03-08 ENCOUNTER — Inpatient Hospital Stay: Payer: 59 | Admitting: Oncology

## 2023-03-08 ENCOUNTER — Inpatient Hospital Stay: Payer: 59 | Attending: Oncology | Admitting: Oncology

## 2023-03-08 ENCOUNTER — Encounter: Payer: Self-pay | Admitting: Oncology

## 2023-03-08 VITALS — BP 133/70 | HR 72 | Temp 97.9°F | Resp 16 | Ht 61.0 in | Wt 85.3 lb

## 2023-03-08 DIAGNOSIS — E782 Mixed hyperlipidemia: Secondary | ICD-10-CM

## 2023-03-08 DIAGNOSIS — I82403 Acute embolism and thrombosis of unspecified deep veins of lower extremity, bilateral: Secondary | ICD-10-CM | POA: Diagnosis not present

## 2023-03-08 DIAGNOSIS — E611 Iron deficiency: Secondary | ICD-10-CM

## 2023-03-08 DIAGNOSIS — Z86718 Personal history of other venous thrombosis and embolism: Secondary | ICD-10-CM | POA: Diagnosis not present

## 2023-03-08 DIAGNOSIS — Z87891 Personal history of nicotine dependence: Secondary | ICD-10-CM | POA: Diagnosis not present

## 2023-03-08 DIAGNOSIS — F0390 Unspecified dementia without behavioral disturbance: Secondary | ICD-10-CM

## 2023-03-08 DIAGNOSIS — Z7901 Long term (current) use of anticoagulants: Secondary | ICD-10-CM

## 2023-03-08 DIAGNOSIS — Z888 Allergy status to other drugs, medicaments and biological substances status: Secondary | ICD-10-CM | POA: Diagnosis not present

## 2023-03-08 DIAGNOSIS — D649 Anemia, unspecified: Secondary | ICD-10-CM

## 2023-03-08 DIAGNOSIS — N184 Chronic kidney disease, stage 4 (severe): Secondary | ICD-10-CM

## 2023-03-08 DIAGNOSIS — Z79899 Other long term (current) drug therapy: Secondary | ICD-10-CM

## 2023-03-08 DIAGNOSIS — Z885 Allergy status to narcotic agent status: Secondary | ICD-10-CM | POA: Diagnosis not present

## 2023-03-08 LAB — IRON AND TIBC
Iron: 62 ug/dL (ref 28–170)
Saturation Ratios: 27 % (ref 10.4–31.8)
TIBC: 228 ug/dL — ABNORMAL LOW (ref 250–450)
UIBC: 166 ug/dL

## 2023-03-08 LAB — VITAMIN B12: Vitamin B-12: 369 pg/mL (ref 180–914)

## 2023-03-08 LAB — FERRITIN: Ferritin: 126 ng/mL (ref 11–307)

## 2023-03-08 LAB — FOLATE: Folate: 5.8 ng/mL — ABNORMAL LOW (ref >5.9)

## 2023-03-08 NOTE — Assessment & Plan Note (Signed)
Patient has a history of CKD being followed by Dr. Wolfgang Phoenix

## 2023-03-08 NOTE — Assessment & Plan Note (Signed)
Anemia likely multifactorial at this time.  Iron deficiency and chronic kidney disease.  Patient is asymptomatic at this time.  Patient never had a colonoscopy done.  Denies melena or blood in stools. -Will order blood work today to assess hemoglobin and iron levels. -If hemoglobin is less than 10 and no iron deficiency, will consider erythropoietin injections every two weeks. -If iron deficiency is present, will initiate iron supplementation. -Return to clinic in 6 weeks with labs.

## 2023-03-08 NOTE — Assessment & Plan Note (Signed)
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 -Continue Eliquis.  Being managed by primary care.

## 2023-03-08 NOTE — Progress Notes (Signed)
[facility] at [facility] HEMATOLOGY NEW VISIT  Delynn Flavin M, DO  REASON FOR REFERRAL: Anemia   HISTORY OF PRESENT ILLNESS: Discussed the use of AI scribe software for clinical note transcription with the patient, who gave verbal consent to proceed.   Robin [name] 83 y.o. female referred for anemia.  She is accompanied by her daughter today.  Patient has a history of dementia and CKD.  She stated that she is doing well and has no complaints today.  She denies fatigue, dyspnea on exertion.  She denies any recent changes in bowel habits or noticing any blood in her stools.  She does not remember ever having a colonoscopy.  Overall she is at baseline and has no new complaints.  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  She lives with her sister, niece, and two grandchildren. She is able to ambulate and dress herself with minimal assistance. She has a remote history of smoking but quit many years ago and denies any alcohol use.   I have reviewed the past medical history, past surgical history, social history and family history with the patient   ALLERGIES:  is allergic to hydromorphone, hydromorphone hcl, dilaudid [hydromorphone hcl], lisinopril, and valium [diazepam].  MEDICATIONS:  Current Outpatient Medications  Medication Sig Dispense Refill   albuterol (VENTOLIN HFA) 108 (90 Base) MCG/ACT inhaler Inhale 2 puffs into the lungs every 6 (six) hours as needed for wheezing or shortness of breath. 8 g 2   apixaban (ELIQUIS) 5 MG TABS tablet Take 1 tablet (5 mg total) by mouth 2 (two) times daily. 180 tablet 3   Budeson-Glycopyrrol-Formoterol (BREZTRI AEROSPHERE) 160-9-4.8 MCG/ACT AERO Inhale 2 puffs into the lungs 2 (two) times daily. (Patient taking differently: Inhale 2 puffs into the lungs as needed (out of breath).) 10.7 g 11   cephALEXin (KEFLEX) 250 MG capsule Take 1 capsule (250 mg total) by mouth daily for 7 days. **renally dosed for  GFR 11 7 capsule 0   loratadine (CLARITIN) 10 MG tablet Take 1 tablet every OTHER day. Renal dosing 45 tablet 3   ondansetron (ZOFRAN) 4 MG tablet Take 1 tablet (4 mg total) by mouth every 8 (eight) hours as needed for nausea or vomiting.     ondansetron (ZOFRAN) 4 MG tablet Take 1 tablet (4 mg total) by mouth every 8 (eight) hours as needed for nausea or vomiting. 4 tablet 0   oxyCODONE-acetaminophen (PERCOCET/ROXICET) 5-325 MG tablet Take 1 tablet by mouth every 8 (eight) hours as needed for severe pain. 15 tablet 0   No current facility-administered medications for this visit.     REVIEW OF SYSTEMS:   Constitutional: Denies fevers, chills or night sweats Eyes: Denies blurriness of vision Ears, nose, mouth, throat, and face: Denies mucositis or sore throat Respiratory: Denies cough, dyspnea or wheezes Cardiovascular: Denies palpitation, chest discomfort or lower extremity swelling Gastrointestinal:  Denies nausea, heartburn or change in bowel habits Skin: Denies abnormal skin rashes Lymphatics: Denies new lymphadenopathy or easy bruising Neurological:Denies numbness, tingling or new weaknesses Behavioral/Psych: Mood is stable, no new changes  All other systems were reviewed with the patient and are negative.  PHYSICAL EXAMINATION:   Vitals:   03/08/23 1032  BP: 133/70  Pulse: 72  Resp: 16  Temp: 97.9 F (36.6 C)  SpO2: 98%    GENERAL:alert, no distress and comfortable SKIN: skin color, texture, turgor are normal, no rashes or significant lesions LUNGS: clear to auscultation and percussion  with normal breathing effort HEART: regular rate & rhythm and no murmurs and no lower extremity edema ABDOMEN:abdomen soft, non-tender and normal bowel sounds Musculoskeletal:no cyanosis of digits and no clubbing  NEURO: alert & oriented x 3 with fluent speech  LABORATORY DATA:  I have reviewed the data as listed  Lab Results  Component Value Date   WBC 7.5 03/04/2023   NEUTROABS  3.7 03/26/2022   HGB 8.9 (LL) 03/04/2023   HCT 28.4 (L) 03/04/2023   MCV 97 03/04/2023   PLT 355 03/04/2023      Component Value Date/Time   NA 140 03/04/2023 1352   K 4.8 03/04/2023 1352   CL 110 (H) 03/04/2023 1352   CO2 13 (L) 03/04/2023 1352   GLUCOSE 94 03/04/2023 1352   GLUCOSE 110 (H) 11/13/2022 1932   BUN 35 (H) 03/04/2023 1352   CREATININE 4.21 (HH) 03/04/2023 1352   CALCIUM 8.9 03/04/2023 1352   PROT 5.9 (L) 11/13/2022 1932   PROT 5.9 (L) 12/30/2021 1458   ALBUMIN 4.5 03/04/2023 1352   AST 10 (L) 11/13/2022 1932   ALT 11 11/13/2022 1932   ALKPHOS 90 11/13/2022 1932   BILITOT 0.4 11/13/2022 1932   BILITOT 0.4 12/30/2021 1458   GFRNONAA 13 (L) 11/13/2022 1932   GFRAA 37 (L) 04/09/2019 1103       Chemistry      Component Value Date/Time   NA 140 03/04/2023 1352   K 4.8 03/04/2023 1352   CL 110 (H) 03/04/2023 1352   CO2 13 (L) 03/04/2023 1352   BUN 35 (H) 03/04/2023 1352   CREATININE 4.21 (HH) 03/04/2023 1352      Component Value Date/Time   CALCIUM 8.9 03/04/2023 1352   ALKPHOS 90 11/13/2022 1932   AST 10 (L) 11/13/2022 1932   ALT 11 11/13/2022 1932   BILITOT 0.4 11/13/2022 1932   BILITOT 0.4 12/30/2021 1458      ASSESSMENT & PLAN:  Patient is an 83 year old female with past medical history of recurrent DVT on Eliquis, dementia and CKD referred for anemia.   Mixed hyperlipidemia Anemia likely multifactorial at this time.  Iron deficiency and chronic kidney disease.  Patient is asymptomatic at this time.  Patient never had a colonoscopy done.  Denies melena or blood in stools. -Will order blood work today to assess hemoglobin and iron levels. -If hemoglobin is less than 10 and no iron deficiency, will consider erythropoietin injections every two weeks. -If iron deficiency is present, will initiate iron supplementation. -Return to clinic in 6 weeks with labs.  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 -Continue Eliquis.  Being managed by primary care.  CKD (chronic kidney disease) stage 4, GFR 15-29 ml/min (HCC) Patient has a history of CKD being followed by Dr. Wolfgang Phoenix   Orders Placed This Encounter  Procedures   Ferritin    Standing Status:   Future    Number of Occurrences:   1    Expected Date:   03/08/2023    Expiration Date:   03/07/2024   Folate    Standing Status:   Future    Number of Occurrences:   1    Expected Date:   03/08/2023    Expiration Date:   03/07/2024   Vitamin B12    Standing Status:   Future    Number of Occurrences:   1    Expected Date:   03/08/2023  Expiration Date:   03/07/2024   Iron and TIBC    Standing Status:   Future    Number of Occurrences:   1    Expected Date:   03/08/2023    Expiration Date:   03/07/2024    Release to patient:   Immediate [1]    The total time spent in the appointment was 45 minutes encounter with patients including review of chart and various tests results, discussions about plan of care and coordination of care plan   All questions were answered. The patient knows to call the clinic with any problems, questions or concerns. No barriers to learning was detected.   Cindie Crumbly, MD 1/28/202512:03 PM

## 2023-03-09 ENCOUNTER — Other Ambulatory Visit: Payer: Self-pay | Admitting: Oncology

## 2023-03-09 MED ORDER — FOLIC ACID 1 MG PO TABS: 1.0000 mg | ORAL_TABLET | Freq: Every day | ORAL | 2 refills | Status: DC

## 2023-03-12 ENCOUNTER — Encounter: Payer: Self-pay | Admitting: Oncology

## 2023-03-14 ENCOUNTER — Encounter: Payer: Self-pay | Admitting: Oncology

## 2023-03-16 ENCOUNTER — Inpatient Hospital Stay: Payer: 59

## 2023-03-16 ENCOUNTER — Inpatient Hospital Stay: Payer: 59 | Attending: Oncology

## 2023-03-16 ENCOUNTER — Other Ambulatory Visit: Payer: Self-pay

## 2023-03-16 VITALS — BP 125/56 | HR 67 | Temp 97.6°F

## 2023-03-16 DIAGNOSIS — D631 Anemia in chronic kidney disease: Secondary | ICD-10-CM | POA: Insufficient documentation

## 2023-03-16 DIAGNOSIS — D649 Anemia, unspecified: Secondary | ICD-10-CM

## 2023-03-16 DIAGNOSIS — N185 Chronic kidney disease, stage 5: Secondary | ICD-10-CM | POA: Insufficient documentation

## 2023-03-16 DIAGNOSIS — E785 Hyperlipidemia, unspecified: Secondary | ICD-10-CM | POA: Insufficient documentation

## 2023-03-16 DIAGNOSIS — I82403 Acute embolism and thrombosis of unspecified deep veins of lower extremity, bilateral: Secondary | ICD-10-CM | POA: Diagnosis not present

## 2023-03-16 DIAGNOSIS — Z79899 Other long term (current) drug therapy: Secondary | ICD-10-CM | POA: Diagnosis not present

## 2023-03-16 LAB — CBC
HCT: 26 % — ABNORMAL LOW (ref 36.0–46.0)
Hemoglobin: 7.7 g/dL — ABNORMAL LOW (ref 12.0–15.0)
MCH: 31.4 pg (ref 26.0–34.0)
MCHC: 29.6 g/dL — ABNORMAL LOW (ref 30.0–36.0)
MCV: 106.1 fL — ABNORMAL HIGH (ref 80.0–100.0)
Platelets: 264 10*3/uL (ref 150–400)
RBC: 2.45 MIL/uL — ABNORMAL LOW (ref 3.87–5.11)
RDW: 14.9 % (ref 11.5–15.5)
WBC: 5.8 10*3/uL (ref 4.0–10.5)
nRBC: 0 % (ref 0.0–0.2)

## 2023-03-16 MED ORDER — EPOETIN ALFA-EPBX 3000 UNIT/ML IJ SOLN
3000.0000 [IU] | Freq: Once | INTRAMUSCULAR | Status: AC
  Administered 2023-03-16: 3000 [IU] via SUBCUTANEOUS
  Filled 2023-03-16: qty 1

## 2023-03-16 NOTE — Progress Notes (Signed)
 Hemoglobin today is 7.7.  Patient is asymptomatic.  MD made aware.  We will hold off on transfusion per Dr. Davonna.  We will proceed with Retacrit  injection per MD orders.  Patient tolerated injection with no complaints voiced.  Site clean and dry with no bruising or swelling noted.  No complaints of pain.  Discharged with vital signs stable and no signs or symptoms of distress noted.

## 2023-03-16 NOTE — Patient Instructions (Signed)
 [facility] [place] - A DEPT OF [facility]  Discharge Instructions: Thank you for choosing [facility] to provide your oncology and hematology care.  If you have a lab appointment with the Cancer Center - please note that after April 8th, 2024, all labs will be drawn in the cancer center.  You do not have to check in or register with the main entrance as you have in the past but will complete your check-in in the cancer center.  Wear comfortable clothing and clothing appropriate for easy access to any Portacath or PICC line.   We strive to give you quality time with your provider. You may need to reschedule your appointment if you arrive late (15 or more minutes).  Arriving late affects you and other patients whose appointments are after yours.  Also, if you miss three or more appointments without notifying the office, you may be dismissed from the clinic at the provider's discretion.      For prescription refill requests, have your pharmacy contact our office and allow 72 hours for refills to be completed.    Today you received the following :  Retacrit.  Epoetin Alfa Injection What is this medication? EPOETIN ALFA (e POE e tin AL fa) treats low levels of red blood cells (anemia) caused by kidney disease, chemotherapy, or HIV medications. It can also be used in people who are at risk for blood loss during surgery. It works by Systems analyst make more red blood cells, which reduces the need for blood transfusions. This medicine may be used for other purposes; ask your health care provider or pharmacist if you have questions. COMMON BRAND NAME(S): Epogen, Procrit, Retacrit What should I tell my care team before I take this medication? They need to know if you have any of these conditions: Blood clots Cancer Heart disease High blood pressure On dialysis Seizures Stroke An unusual or allergic reaction to epoetin alfa, albumin, benzyl alcohol, other  medications, foods, dyes, or preservatives Pregnant or trying to get pregnant Breast-feeding How should I use this medication? This medication is injected into a vein or under the skin. It is usually given by your care team in a hospital or clinic setting. It may also be given at home. If you get this medication at home, you will be taught how to prepare and give it. Use exactly as directed. Take it as directed on the prescription label at the same time every day. Keep taking it unless your care team tells you to stop. It is important that you put your used needles and syringes in a special sharps container. Do not put them in a trash can. If you do not have a sharps container, call your pharmacist or care team to get one. A special MedGuide will be given to you by the pharmacist with each prescription and refill. Be sure to read this information carefully each time. Talk to your care team about the use of this medication in children. While this medication may be used in children as young as 1 month of age for selected conditions, precautions do apply. Overdosage: If you think you have taken too much of this medicine contact a poison control center or emergency room at once. NOTE: This medicine is only for you. Do not share this medicine with others. What if I miss a dose? If you miss a dose, take it as soon as you can. If it is almost time for your  next dose, take only that dose. Do not take double or extra doses. What may interact with this medication? Darbepoetin alfa Methoxy polyethylene glycol-epoetin beta This list may not describe all possible interactions. Give your health care provider a list of all the medicines, herbs, non-prescription drugs, or dietary supplements you use. Also tell them if you smoke, drink alcohol, or use illegal drugs. Some items may interact with your medicine. What should I watch for while using this medication? Visit your care team for regular checks on your  progress. Check your blood pressure as directed. Know what your blood pressure should be and when to contact your care team. Your condition will be monitored carefully while you are receiving this medication. You may need blood work while taking this medication. What side effects may I notice from receiving this medication? Side effects that you should report to your care team as soon as possible: Allergic reactions--skin rash, itching, hives, swelling of the face, lips, tongue, or throat Blood clot--pain, swelling, or warmth in the leg, shortness of breath, chest pain Heart attack--pain or tightness in the chest, shoulders, arms, or jaw, nausea, shortness of breath, cold or clammy skin, feeling faint or lightheaded Increase in blood pressure Rash, fever, and swollen lymph nodes Redness, blistering, peeling, or loosening of the skin, including inside the mouth Seizures Stroke--sudden numbness or weakness of the face, arm, or leg, trouble speaking, confusion, trouble walking, loss of balance or coordination, dizziness, severe headache, change in vision Side effects that usually do not require medical attention (report to your care team if they continue or are bothersome): Bone, joint, or muscle pain Cough Headache Nausea Pain, redness, or irritation at injection site This list may not describe all possible side effects. Call your doctor for medical advice about side effects. You may report side effects to FDA at 1-800-FDA-1088. Where should I keep my medication? Keep out of the reach of children and pets. Store in a refrigerator. Do not freeze. Do not shake. Protect from light. Keep this medication in the original container until you are ready to take it. See product for storage information. Get rid of any unused medication after the expiration date. To get rid of medications that are no longer needed or have expired: Take the medication to a medication take-back program. Check with your  pharmacy or law enforcement to find a location. If you cannot return the medication, ask your pharmacist or care team how to get rid of the medication safely. NOTE: This sheet is a summary. It may not cover all possible information. If you have questions about this medicine, talk to your doctor, pharmacist, or health care provider.  2024 Elsevier/Gold Standard (2021-05-29 00:00:00)    To help prevent nausea and vomiting after your treatment, we encourage you to take your nausea medication as directed.  BELOW ARE SYMPTOMS THAT SHOULD BE REPORTED IMMEDIATELY: *FEVER GREATER THAN 100.4 F (38 C) OR HIGHER *CHILLS OR SWEATING *NAUSEA AND VOMITING THAT IS NOT CONTROLLED WITH YOUR NAUSEA MEDICATION *UNUSUAL SHORTNESS OF BREATH *UNUSUAL BRUISING OR BLEEDING *URINARY PROBLEMS (pain or burning when urinating, or frequent urination) *BOWEL PROBLEMS (unusual diarrhea, constipation, pain near the anus) TENDERNESS IN MOUTH AND THROAT WITH OR WITHOUT PRESENCE OF ULCERS (sore throat, sores in mouth, or a toothache) UNUSUAL RASH, SWELLING OR PAIN  UNUSUAL VAGINAL DISCHARGE OR ITCHING   Items with * indicate a potential emergency and should be followed up as soon as possible or go to the Emergency Department if any problems should  occur.  Please show the CHEMOTHERAPY ALERT CARD or IMMUNOTHERAPY ALERT CARD at check-in to the Emergency Department and triage nurse.  Should you have questions after your visit or need to cancel or reschedule your appointment, please contact [facility] [place] - A DEPT OF [facility] [phone]  and follow the prompts.  Office hours are 8:00 a.m. to 4:30 p.m. Monday - Friday. Please note that voicemails left after 4:00 p.m. may not be returned until the following business day.  We are closed weekends and major holidays. You have access to a nurse at all times for urgent questions. Please call the main number to the clinic [phone] and follow the  prompts.  For any non-urgent questions, you may also contact your provider using MyChart. We now offer e-Visits for anyone 73 and older to request care online for non-urgent symptoms. For details visit [email or web address].   Also download the MyChart app! Go to the app store, search "MyChart", open the app, select [place], and log in with your MyChart username and password.

## 2023-03-16 NOTE — Progress Notes (Signed)
 Eduardo Grade, MD  Lorena Rolling, LPN Can you please add myeloma panel for her labs next time? SPEP with IFE, Free light chains and immunoglobulins.  Orders placed and added on to next lab draw.

## 2023-03-16 NOTE — Assessment & Plan Note (Signed)
 Anemia likely multifactorial at this time.  Iron deficiency and chronic kidney disease.  Patient is asymptomatic at this time.  Patient never had a colonoscopy done.  Denies melena or blood in stools. -Will order blood work today to assess hemoglobin and iron levels. -If hemoglobin is less than 10 and no iron deficiency, will consider erythropoietin injections every two weeks. -If iron deficiency is present, will initiate iron supplementation. -Return to clinic in 6 weeks with labs.

## 2023-03-29 ENCOUNTER — Inpatient Hospital Stay: Payer: 59

## 2023-03-30 ENCOUNTER — Inpatient Hospital Stay: Payer: 59

## 2023-04-01 ENCOUNTER — Inpatient Hospital Stay: Payer: 59

## 2023-04-01 VITALS — BP 111/68 | HR 66 | Temp 97.8°F | Resp 16

## 2023-04-01 DIAGNOSIS — E785 Hyperlipidemia, unspecified: Secondary | ICD-10-CM | POA: Diagnosis not present

## 2023-04-01 DIAGNOSIS — I82403 Acute embolism and thrombosis of unspecified deep veins of lower extremity, bilateral: Secondary | ICD-10-CM | POA: Diagnosis not present

## 2023-04-01 DIAGNOSIS — D631 Anemia in chronic kidney disease: Secondary | ICD-10-CM

## 2023-04-01 DIAGNOSIS — Z79899 Other long term (current) drug therapy: Secondary | ICD-10-CM | POA: Diagnosis not present

## 2023-04-01 DIAGNOSIS — N185 Chronic kidney disease, stage 5: Secondary | ICD-10-CM | POA: Diagnosis not present

## 2023-04-01 LAB — CBC
HCT: 28.4 % — ABNORMAL LOW (ref 36.0–46.0)
Hemoglobin: 8.5 g/dL — ABNORMAL LOW (ref 12.0–15.0)
MCH: 31.4 pg (ref 26.0–34.0)
MCHC: 29.9 g/dL — ABNORMAL LOW (ref 30.0–36.0)
MCV: 104.8 fL — ABNORMAL HIGH (ref 80.0–100.0)
Platelets: 237 10*3/uL (ref 150–400)
RBC: 2.71 MIL/uL — ABNORMAL LOW (ref 3.87–5.11)
RDW: 14.7 % (ref 11.5–15.5)
WBC: 8.4 10*3/uL (ref 4.0–10.5)
nRBC: 0 % (ref 0.0–0.2)

## 2023-04-01 MED ORDER — EPOETIN ALFA-EPBX 3000 UNIT/ML IJ SOLN
3000.0000 [IU] | Freq: Once | INTRAMUSCULAR | Status: AC
  Administered 2023-04-01: 3000 [IU] via SUBCUTANEOUS
  Filled 2023-04-01: qty 1

## 2023-04-01 NOTE — Patient Instructions (Signed)
 [facility] [place] [facility]  Discharge Instructions: Thank you for choosing [facility] to provide your oncology and hematology care.  If you have a lab appointment with the Cancer Center - please note that after April 8th, 2024, all labs will be drawn in the cancer center.  You do not have to check in or register with the main entrance as you have in the past but will complete your check-in in the cancer center.  Wear comfortable clothing and clothing appropriate for easy access to any Portacath or PICC line.   We strive to give you quality time with your provider. You may need to reschedule your appointment if you arrive late (15 or more minutes).  Arriving late affects you and other patients whose appointments are after yours.  Also, if you miss three or more appointments without notifying the office, you may be dismissed from the clinic at the provider's discretion.      For prescription refill requests, have your pharmacy contact our office and allow 72 hours for refills to be completed.    Today you received the following chemotherapy and/or immunotherapy agents Retacrit      To help prevent nausea and vomiting after your treatment, we encourage you to take your nausea medication as directed.  BELOW ARE SYMPTOMS THAT SHOULD BE REPORTED IMMEDIATELY: *FEVER GREATER THAN 100.4 F (38 C) OR HIGHER *CHILLS OR SWEATING *NAUSEA AND VOMITING THAT IS NOT CONTROLLED WITH YOUR NAUSEA MEDICATION *UNUSUAL SHORTNESS OF BREATH *UNUSUAL BRUISING OR BLEEDING *URINARY PROBLEMS (pain or burning when urinating, or frequent urination) *BOWEL PROBLEMS (unusual diarrhea, constipation, pain near the anus) TENDERNESS IN MOUTH AND THROAT WITH OR WITHOUT PRESENCE OF ULCERS (sore throat, sores in mouth, or a toothache) UNUSUAL RASH, SWELLING OR PAIN  UNUSUAL VAGINAL DISCHARGE OR ITCHING   Items with * indicate a potential emergency and should be followed up  as soon as possible or go to the Emergency Department if any problems should occur.  Please show the CHEMOTHERAPY ALERT CARD or IMMUNOTHERAPY ALERT CARD at check-in to the Emergency Department and triage nurse.  Should you have questions after your visit or need to cancel or reschedule your appointment, please contact [facility] [place] - A DEPT OF [facility] [phone]  and follow the prompts.  Office hours are 8:00 a.m. to 4:30 p.m. Monday - Friday. Please note that voicemails left after 4:00 p.m. may not be returned until the following business day.  We are closed weekends and major holidays. You have access to a nurse at all times for urgent questions. Please call the main number to the clinic [phone] and follow the prompts.  For any non-urgent questions, you may also contact your provider using MyChart. We now offer e-Visits for anyone 90 and older to request care online for non-urgent symptoms. For details visit [email or web address].   Also download the MyChart app! Go to the app store, search "MyChart", open the app, select [place], and log in with your MyChart username and password.

## 2023-04-01 NOTE — Progress Notes (Signed)
Patient presents today for Retacrit injection per providers order.  Hgb noted to be 8.5.  Vital signs WNL. Patient has no new complaints at this time.  Stable during administration without incident; injection site WNL; see MAR for injection details.  Patient tolerated procedure well and without incident.  No questions or complaints noted at this time.

## 2023-04-02 LAB — IGG, IGA, IGM
IgA: 25 mg/dL — ABNORMAL LOW (ref 64–422)
IgG (Immunoglobin G), Serum: 408 mg/dL — ABNORMAL LOW (ref 586–1602)
IgM (Immunoglobulin M), Srm: 8 mg/dL — ABNORMAL LOW (ref 26–217)

## 2023-04-04 LAB — KAPPA/LAMBDA LIGHT CHAINS
Kappa free light chain: 23.4 mg/L — ABNORMAL HIGH (ref 3.3–19.4)
Kappa, lambda light chain ratio: 0 — ABNORMAL LOW (ref 0.26–1.65)
Lambda free light chains: 6351.3 mg/L — ABNORMAL HIGH (ref 5.7–26.3)

## 2023-04-05 LAB — PROTEIN ELECTROPHORESIS, SERUM
A/G Ratio: 1.9 — ABNORMAL HIGH (ref 0.7–1.7)
Albumin ELP: 3.7 g/dL (ref 2.9–4.4)
Alpha-1-Globulin: 0.2 g/dL (ref 0.0–0.4)
Alpha-2-Globulin: 0.6 g/dL (ref 0.4–1.0)
Beta Globulin: 0.6 g/dL — ABNORMAL LOW (ref 0.7–1.3)
Gamma Globulin: 0.5 g/dL (ref 0.4–1.8)
Globulin, Total: 2 g/dL — ABNORMAL LOW (ref 2.2–3.9)
M-Spike, %: 0.3 g/dL — ABNORMAL HIGH
Total Protein ELP: 5.7 g/dL — ABNORMAL LOW (ref 6.0–8.5)

## 2023-04-05 LAB — IMMUNOFIXATION ELECTROPHORESIS
IgA: 24 mg/dL — ABNORMAL LOW (ref 64–422)
IgG (Immunoglobin G), Serum: 391 mg/dL — ABNORMAL LOW (ref 586–1602)
IgM (Immunoglobulin M), Srm: 6 mg/dL — ABNORMAL LOW (ref 26–217)
Total Protein ELP: 5.7 g/dL — ABNORMAL LOW (ref 6.0–8.5)

## 2023-04-13 ENCOUNTER — Inpatient Hospital Stay: Payer: 59

## 2023-04-14 ENCOUNTER — Other Ambulatory Visit: Payer: Self-pay

## 2023-04-14 DIAGNOSIS — D631 Anemia in chronic kidney disease: Secondary | ICD-10-CM

## 2023-04-14 DIAGNOSIS — N184 Chronic kidney disease, stage 4 (severe): Secondary | ICD-10-CM

## 2023-04-14 DIAGNOSIS — E782 Mixed hyperlipidemia: Secondary | ICD-10-CM

## 2023-04-15 ENCOUNTER — Inpatient Hospital Stay: Payer: 59

## 2023-04-15 ENCOUNTER — Inpatient Hospital Stay: Payer: 59 | Attending: Hematology

## 2023-04-15 VITALS — BP 121/57 | HR 73 | Temp 97.6°F | Resp 17

## 2023-04-15 DIAGNOSIS — F0390 Unspecified dementia without behavioral disturbance: Secondary | ICD-10-CM | POA: Insufficient documentation

## 2023-04-15 DIAGNOSIS — E782 Mixed hyperlipidemia: Secondary | ICD-10-CM

## 2023-04-15 DIAGNOSIS — Z86718 Personal history of other venous thrombosis and embolism: Secondary | ICD-10-CM | POA: Diagnosis not present

## 2023-04-15 DIAGNOSIS — I82403 Acute embolism and thrombosis of unspecified deep veins of lower extremity, bilateral: Secondary | ICD-10-CM | POA: Diagnosis not present

## 2023-04-15 DIAGNOSIS — Z7901 Long term (current) use of anticoagulants: Secondary | ICD-10-CM | POA: Insufficient documentation

## 2023-04-15 DIAGNOSIS — Z602 Problems related to living alone: Secondary | ICD-10-CM | POA: Diagnosis not present

## 2023-04-15 DIAGNOSIS — Z885 Allergy status to narcotic agent status: Secondary | ICD-10-CM | POA: Diagnosis not present

## 2023-04-15 DIAGNOSIS — N185 Chronic kidney disease, stage 5: Secondary | ICD-10-CM | POA: Diagnosis not present

## 2023-04-15 DIAGNOSIS — D631 Anemia in chronic kidney disease: Secondary | ICD-10-CM | POA: Insufficient documentation

## 2023-04-15 DIAGNOSIS — N184 Chronic kidney disease, stage 4 (severe): Secondary | ICD-10-CM

## 2023-04-15 DIAGNOSIS — L089 Local infection of the skin and subcutaneous tissue, unspecified: Secondary | ICD-10-CM | POA: Insufficient documentation

## 2023-04-15 DIAGNOSIS — Z79899 Other long term (current) drug therapy: Secondary | ICD-10-CM | POA: Insufficient documentation

## 2023-04-15 DIAGNOSIS — Z888 Allergy status to other drugs, medicaments and biological substances status: Secondary | ICD-10-CM | POA: Diagnosis not present

## 2023-04-15 LAB — CBC WITH DIFFERENTIAL/PLATELET
Abs Immature Granulocytes: 0.05 10*3/uL (ref 0.00–0.07)
Basophils Absolute: 0 10*3/uL (ref 0.0–0.1)
Basophils Relative: 1 %
Eosinophils Absolute: 0.1 10*3/uL (ref 0.0–0.5)
Eosinophils Relative: 1 %
HCT: 29.3 % — ABNORMAL LOW (ref 36.0–46.0)
Hemoglobin: 8.4 g/dL — ABNORMAL LOW (ref 12.0–15.0)
Immature Granulocytes: 1 %
Lymphocytes Relative: 20 %
Lymphs Abs: 1.7 10*3/uL (ref 0.7–4.0)
MCH: 30.4 pg (ref 26.0–34.0)
MCHC: 28.7 g/dL — ABNORMAL LOW (ref 30.0–36.0)
MCV: 106.2 fL — ABNORMAL HIGH (ref 80.0–100.0)
Monocytes Absolute: 0.4 10*3/uL (ref 0.1–1.0)
Monocytes Relative: 5 %
Neutro Abs: 6.4 10*3/uL (ref 1.7–7.7)
Neutrophils Relative %: 72 %
Platelets: 270 10*3/uL (ref 150–400)
RBC: 2.76 MIL/uL — ABNORMAL LOW (ref 3.87–5.11)
RDW: 14.2 % (ref 11.5–15.5)
WBC: 8.8 10*3/uL (ref 4.0–10.5)
nRBC: 0 % (ref 0.0–0.2)

## 2023-04-15 MED ORDER — EPOETIN ALFA-EPBX 3000 UNIT/ML IJ SOLN
3000.0000 [IU] | Freq: Once | INTRAMUSCULAR | Status: AC
  Administered 2023-04-15: 3000 [IU] via SUBCUTANEOUS
  Filled 2023-04-15: qty 1

## 2023-04-15 NOTE — Patient Instructions (Signed)
 [facility] [place] - A DEPT OF [facility]  Discharge Instructions: Thank you for choosing [facility] to provide your oncology and hematology care.  If you have a lab appointment with the Cancer Center - please note that after April 8th, 2024, all labs will be drawn in the cancer center.  You do not have to check in or register with the main entrance as you have in the past but will complete your check-in in the cancer center.  Wear comfortable clothing and clothing appropriate for easy access to any Portacath or PICC line.   We strive to give you quality time with your provider. You may need to reschedule your appointment if you arrive late (15 or more minutes).  Arriving late affects you and other patients whose appointments are after yours.  Also, if you miss three or more appointments without notifying the office, you may be dismissed from the clinic at the provider's discretion.      For prescription refill requests, have your pharmacy contact our office and allow 72 hours for refills to be completed.    Today you received the following :  Retacrit.  Epoetin Alfa Injection What is this medication? EPOETIN ALFA (e POE e tin AL fa) treats low levels of red blood cells (anemia) caused by kidney disease, chemotherapy, or HIV medications. It can also be used in people who are at risk for blood loss during surgery. It works by Systems analyst make more red blood cells, which reduces the need for blood transfusions. This medicine may be used for other purposes; ask your health care provider or pharmacist if you have questions. COMMON BRAND NAME(S): Epogen, Procrit, Retacrit What should I tell my care team before I take this medication? They need to know if you have any of these conditions: Blood clots Cancer Heart disease High blood pressure On dialysis Seizures Stroke An unusual or allergic reaction to epoetin alfa, albumin, benzyl alcohol, other  medications, foods, dyes, or preservatives Pregnant or trying to get pregnant Breast-feeding How should I use this medication? This medication is injected into a vein or under the skin. It is usually given by your care team in a hospital or clinic setting. It may also be given at home. If you get this medication at home, you will be taught how to prepare and give it. Use exactly as directed. Take it as directed on the prescription label at the same time every day. Keep taking it unless your care team tells you to stop. It is important that you put your used needles and syringes in a special sharps container. Do not put them in a trash can. If you do not have a sharps container, call your pharmacist or care team to get one. A special MedGuide will be given to you by the pharmacist with each prescription and refill. Be sure to read this information carefully each time. Talk to your care team about the use of this medication in children. While this medication may be used in children as young as 1 month of age for selected conditions, precautions do apply. Overdosage: If you think you have taken too much of this medicine contact a poison control center or emergency room at once. NOTE: This medicine is only for you. Do not share this medicine with others. What if I miss a dose? If you miss a dose, take it as soon as you can. If it is almost time for your  next dose, take only that dose. Do not take double or extra doses. What may interact with this medication? Darbepoetin alfa Methoxy polyethylene glycol-epoetin beta This list may not describe all possible interactions. Give your health care provider a list of all the medicines, herbs, non-prescription drugs, or dietary supplements you use. Also tell them if you smoke, drink alcohol, or use illegal drugs. Some items may interact with your medicine. What should I watch for while using this medication? Visit your care team for regular checks on your  progress. Check your blood pressure as directed. Know what your blood pressure should be and when to contact your care team. Your condition will be monitored carefully while you are receiving this medication. You may need blood work while taking this medication. What side effects may I notice from receiving this medication? Side effects that you should report to your care team as soon as possible: Allergic reactions--skin rash, itching, hives, swelling of the face, lips, tongue, or throat Blood clot--pain, swelling, or warmth in the leg, shortness of breath, chest pain Heart attack--pain or tightness in the chest, shoulders, arms, or jaw, nausea, shortness of breath, cold or clammy skin, feeling faint or lightheaded Increase in blood pressure Rash, fever, and swollen lymph nodes Redness, blistering, peeling, or loosening of the skin, including inside the mouth Seizures Stroke--sudden numbness or weakness of the face, arm, or leg, trouble speaking, confusion, trouble walking, loss of balance or coordination, dizziness, severe headache, change in vision Side effects that usually do not require medical attention (report to your care team if they continue or are bothersome): Bone, joint, or muscle pain Cough Headache Nausea Pain, redness, or irritation at injection site This list may not describe all possible side effects. Call your doctor for medical advice about side effects. You may report side effects to FDA at 1-800-FDA-1088. Where should I keep my medication? Keep out of the reach of children and pets. Store in a refrigerator. Do not freeze. Do not shake. Protect from light. Keep this medication in the original container until you are ready to take it. See product for storage information. Get rid of any unused medication after the expiration date. To get rid of medications that are no longer needed or have expired: Take the medication to a medication take-back program. Check with your  pharmacy or law enforcement to find a location. If you cannot return the medication, ask your pharmacist or care team how to get rid of the medication safely. NOTE: This sheet is a summary. It may not cover all possible information. If you have questions about this medicine, talk to your doctor, pharmacist, or health care provider.  2024 Elsevier/Gold Standard (2021-05-29 00:00:00)    To help prevent nausea and vomiting after your treatment, we encourage you to take your nausea medication as directed.  BELOW ARE SYMPTOMS THAT SHOULD BE REPORTED IMMEDIATELY: *FEVER GREATER THAN 100.4 F (38 C) OR HIGHER *CHILLS OR SWEATING *NAUSEA AND VOMITING THAT IS NOT CONTROLLED WITH YOUR NAUSEA MEDICATION *UNUSUAL SHORTNESS OF BREATH *UNUSUAL BRUISING OR BLEEDING *URINARY PROBLEMS (pain or burning when urinating, or frequent urination) *BOWEL PROBLEMS (unusual diarrhea, constipation, pain near the anus) TENDERNESS IN MOUTH AND THROAT WITH OR WITHOUT PRESENCE OF ULCERS (sore throat, sores in mouth, or a toothache) UNUSUAL RASH, SWELLING OR PAIN  UNUSUAL VAGINAL DISCHARGE OR ITCHING   Items with * indicate a potential emergency and should be followed up as soon as possible or go to the Emergency Department if any problems should  occur.  Please show the CHEMOTHERAPY ALERT CARD or IMMUNOTHERAPY ALERT CARD at check-in to the Emergency Department and triage nurse.  Should you have questions after your visit or need to cancel or reschedule your appointment, please contact [facility] [place] - A DEPT OF [facility] [phone]  and follow the prompts.  Office hours are 8:00 a.m. to 4:30 p.m. Monday - Friday. Please note that voicemails left after 4:00 p.m. may not be returned until the following business day.  We are closed weekends and major holidays. You have access to a nurse at all times for urgent questions. Please call the main number to the clinic [phone] and follow the  prompts.  For any non-urgent questions, you may also contact your provider using MyChart. We now offer e-Visits for anyone 73 and older to request care online for non-urgent symptoms. For details visit [email or web address].   Also download the MyChart app! Go to the app store, search "MyChart", open the app, select [place], and log in with your MyChart username and password.

## 2023-04-15 NOTE — Progress Notes (Signed)
 Hemoglobin today is 8.4.  Vital signs are within treatment parameters.  We will proceed with Retacrit injection per provider orders.   Patient tolerated injection with no complaints voiced.  Site clean and dry with no bruising or swelling noted.  No complaints of pain.  Discharged with vital signs stable and no signs or symptoms of distress noted.

## 2023-04-16 LAB — IGG, IGA, IGM
IgA: 28 mg/dL — ABNORMAL LOW (ref 64–422)
IgG (Immunoglobin G), Serum: 384 mg/dL — ABNORMAL LOW (ref 586–1602)
IgM (Immunoglobulin M), Srm: 6 mg/dL — ABNORMAL LOW (ref 26–217)

## 2023-04-18 LAB — PROTEIN ELECTROPHORESIS, SERUM
A/G Ratio: 1.6 (ref 0.7–1.7)
Albumin ELP: 3.6 g/dL (ref 2.9–4.4)
Alpha-1-Globulin: 0.3 g/dL (ref 0.0–0.4)
Alpha-2-Globulin: 0.8 g/dL (ref 0.4–1.0)
Beta Globulin: 0.7 g/dL (ref 0.7–1.3)
Gamma Globulin: 0.5 g/dL (ref 0.4–1.8)
Globulin, Total: 2.3 g/dL (ref 2.2–3.9)
M-Spike, %: 0.3 g/dL — ABNORMAL HIGH
Total Protein ELP: 5.9 g/dL — ABNORMAL LOW (ref 6.0–8.5)

## 2023-04-18 LAB — KAPPA/LAMBDA LIGHT CHAINS
Kappa free light chain: 25.7 mg/L — ABNORMAL HIGH (ref 3.3–19.4)
Kappa, lambda light chain ratio: 0 — ABNORMAL LOW (ref 0.26–1.65)
Lambda free light chains: 9048.4 mg/L — ABNORMAL HIGH (ref 5.7–26.3)

## 2023-04-19 LAB — IMMUNOFIXATION ELECTROPHORESIS
IgA: 27 mg/dL — ABNORMAL LOW (ref 64–422)
IgG (Immunoglobin G), Serum: 392 mg/dL — ABNORMAL LOW (ref 586–1602)
IgM (Immunoglobulin M), Srm: 7 mg/dL — ABNORMAL LOW (ref 26–217)
Total Protein ELP: 5.6 g/dL — ABNORMAL LOW (ref 6.0–8.5)

## 2023-04-20 ENCOUNTER — Inpatient Hospital Stay: Payer: 59 | Admitting: Oncology

## 2023-04-22 ENCOUNTER — Inpatient Hospital Stay

## 2023-04-22 ENCOUNTER — Inpatient Hospital Stay: Payer: 59 | Admitting: Oncology

## 2023-04-22 VITALS — BP 134/62 | HR 64 | Temp 98.3°F | Resp 19 | Ht 64.0 in | Wt 84.4 lb

## 2023-04-22 DIAGNOSIS — L089 Local infection of the skin and subcutaneous tissue, unspecified: Secondary | ICD-10-CM | POA: Diagnosis not present

## 2023-04-22 DIAGNOSIS — R768 Other specified abnormal immunological findings in serum: Secondary | ICD-10-CM

## 2023-04-22 DIAGNOSIS — Z86718 Personal history of other venous thrombosis and embolism: Secondary | ICD-10-CM | POA: Diagnosis not present

## 2023-04-22 DIAGNOSIS — N185 Chronic kidney disease, stage 5: Secondary | ICD-10-CM | POA: Diagnosis not present

## 2023-04-22 DIAGNOSIS — I82403 Acute embolism and thrombosis of unspecified deep veins of lower extremity, bilateral: Secondary | ICD-10-CM | POA: Diagnosis not present

## 2023-04-22 DIAGNOSIS — Z888 Allergy status to other drugs, medicaments and biological substances status: Secondary | ICD-10-CM | POA: Diagnosis not present

## 2023-04-22 DIAGNOSIS — D631 Anemia in chronic kidney disease: Secondary | ICD-10-CM

## 2023-04-22 DIAGNOSIS — C9000 Multiple myeloma not having achieved remission: Secondary | ICD-10-CM | POA: Diagnosis not present

## 2023-04-22 DIAGNOSIS — Z602 Problems related to living alone: Secondary | ICD-10-CM | POA: Diagnosis not present

## 2023-04-22 DIAGNOSIS — Z7901 Long term (current) use of anticoagulants: Secondary | ICD-10-CM | POA: Diagnosis not present

## 2023-04-22 DIAGNOSIS — Z79899 Other long term (current) drug therapy: Secondary | ICD-10-CM | POA: Diagnosis not present

## 2023-04-22 DIAGNOSIS — Z885 Allergy status to narcotic agent status: Secondary | ICD-10-CM | POA: Diagnosis not present

## 2023-04-22 DIAGNOSIS — N184 Chronic kidney disease, stage 4 (severe): Secondary | ICD-10-CM

## 2023-04-22 LAB — HEMOGLOBIN: Hemoglobin: 8.6 g/dL — ABNORMAL LOW (ref 12.0–15.0)

## 2023-04-22 NOTE — Assessment & Plan Note (Signed)
 Patient has a history of CKD being followed by Dr. Wolfgang Phoenix -Patient has normal iron labs.  Was started on erythropoietin 6 weeks ago. -Hold erythropoietin injections at this time as patient is planned to get a bone marrow biopsy to diagnose multiple myeloma

## 2023-04-22 NOTE — Patient Instructions (Signed)
 VISIT SUMMARY:  During your follow-up visit, we discussed your current health status, including your kidney disease, anemia, and recent symptoms. We reviewed the preliminary test results that suggest multiple myeloma and addressed your tongue infection. We also talked about your dementia and the importance of involving your family in your care decisions.  YOUR PLAN:  -SUSPECTED MULTIPLE MYELOMA: Multiple myeloma is a type of blood cancer that affects plasma cells in the bone marrow. We need to confirm the diagnosis with a bone marrow biopsy and a PET scan to check for bone lesions. You will also need a 24-hour urine test. We will hold off on EPO injections for now. We will discuss chemotherapy and immunotherapy options with your family and provide you with educational materials about the condition.  -FINGER INFECTION: You have an infection on your FINGER, likely due to mucosal damage. We have prescribed antibiotics to treat the infection and advised you on how to care for the wound. Please monitor the area for any signs of worsening infection.  -DEMENTIA: Dementia affects your short-term memory and comprehension. It is important to involve your family in your treatment decisions. We encourage you to have discussions with your family about your treatment options and goals, and to bring them to future appointments.  INSTRUCTIONS:  Please schedule a follow-up appointment in four weeks to review the biopsy results and discuss treatment options. Encourage your family to prepare any questions they may have and to bring additional members for the discussion.

## 2023-04-22 NOTE — Assessment & Plan Note (Signed)
 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 -Continue Eliquis.  Being managed by primary care.

## 2023-04-22 NOTE — Assessment & Plan Note (Signed)
 Patient has significantly elevated free light chains evaluated further workup of anemia.  Free lambda light chains of 9000, free kappa light chains of 25 with an FLC ratio of 360.  Patient also has severe anemia with a hemoglobin of 8.5 and chronic kidney disease with a creatinine of 4(2 of the 4 CRAB criteria).  Discussed with the patient and daughter that all this point towards a diagnosis of multiple myeloma but will need a bone marrow biopsy for confirmation and restratification. -Obtain PET scan to rule out bone lesions -Will schedule for bone marrow biopsy -Patient has significant dementia and has trouble understanding the diagnosis.  Patient's daughter expressed that she would like to discuss further management with her sister and brother and come up with goals of care for the patient.  Patient lives alone and has no husband.  Does not have a designated power of attorney at this time.  I do not believe that patient can make her own decisions with limited understanding.  Return to clinic in 4 weeks.  Recommended daughter to call the clinic if she wishes to cancel the bone marrow biopsy at this time.

## 2023-04-22 NOTE — Progress Notes (Addendum)
 [facility] at [facility]  HEMATOLOGY FOLLOW-UP VISIT  Robin Flavin M, DO  REASON FOR FOLLOW-UP: Anemia of chronic kidney disease  ASSESSMENT & PLAN:  Patient is an 83 year old female following for anemia of chronic kidney disease   Elevated serum immunoglobulin free light chains Patient has significantly elevated free light chains evaluated further workup of anemia.  Free lambda light chains of 9000, free kappa light chains of 25 with an FLC ratio of 360.  Patient also has severe anemia with a hemoglobin of 8.5 and chronic kidney disease with a creatinine of 4(2 of the 4 CRAB criteria).  Discussed with the patient and daughter that all this point towards a diagnosis of multiple myeloma but will need a bone marrow biopsy for confirmation and restratification. -Obtain PET scan to rule out bone lesions -Will schedule for bone marrow biopsy -Patient has significant dementia and has trouble understanding the diagnosis.  Patient's daughter expressed that she would like to discuss further management with her sister and brother and come up with goals of care for the patient.  Patient lives alone and has no husband.  Does not have a designated power of attorney at this time.  I do not believe that patient can make her own decisions with limited understanding.  Return to clinic in 4 weeks.  Recommended daughter to call the clinic if she wishes to cancel the bone marrow biopsy at this time.  CKD (chronic kidney disease) stage 4, GFR 15-29 ml/min (HCC) Patient has a history of CKD being followed by Dr. Wolfgang Phoenix -Patient has normal iron labs.  Was started on erythropoietin 6 weeks ago. 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 -Continue Eliquis.  Being managed  by primary care.  Skin infection Skin infection around left thumb. -Will send antibiotics -Recommended patient to go to the ER if she develops a fever   Orders Placed This Encounter  Procedures   NM PET Image Initial (PI) Skull Base To Thigh    Standing Status:   Future    Expected Date:   04/22/2023    Expiration Date:   04/21/2024    If indicated for the ordered procedure, I authorize the administration of a radiopharmaceutical per Radiology protocol:   Yes    Preferred imaging location?:   [facility]   24 hr Ur UPEP/UIFE/Light Chains/TP    Standing Status:   Future    Expected Date:   04/22/2023    Expiration Date:   04/21/2024    The total time spent in the appointment was 30 minutes encounter with patients including review of chart and various tests results, discussions about plan of care and coordination of care plan   All questions were answered. The patient knows to call the clinic with any problems, questions or concerns. No barriers to learning was detected.  Cindie Crumbly, MD 3/16/20252:07 PM   INTERVAL HISTORY: Robin [name] 83 y.o. female following for anemia of chronic kidney disease.  She is accompanied by her daughter today.She reports feeling fine and denies any bone pain.  Patient reports that her left thumb is red and had pus yesterday.  She denies any fever or chills, Pain in her thumb.  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  We did discuss that bone marrow biopsy is essential for diagnosis and restratification of the disease.  The patient's daughter expressed that patient has dementia and has limited understanding and would like to discuss with her sister and brother to clarify goals of care for the patient and come together with a unified physician.  I have reviewed the past medical history, past surgical history, social history and family history with the patient   ALLERGIES:  is allergic to  hydromorphone, hydromorphone hcl, dilaudid [hydromorphone hcl], lisinopril, and valium [diazepam].  MEDICATIONS:  Current Outpatient Medications  Medication Sig Dispense Refill   albuterol (VENTOLIN HFA) 108 (90 Base) MCG/ACT inhaler Inhale 2 puffs into the lungs every 6 (six) hours as needed for wheezing or shortness of breath. 8 g 2   apixaban (ELIQUIS) 5 MG TABS tablet Take 1 tablet (5 mg total) by mouth 2 (two) times daily. 180 tablet 3   Budeson-Glycopyrrol-Formoterol (BREZTRI AEROSPHERE) 160-9-4.8 MCG/ACT AERO Inhale 2 puffs into the lungs 2 (two) times daily. (Patient taking differently: Inhale 2 puffs into the lungs as needed (out of breath).) 10.7 g 11   folic acid (FOLVITE) 1 MG tablet Take 1 tablet (1 mg total) by mouth daily. 30 tablet 2   loratadine (CLARITIN) 10 MG tablet Take 1 tablet every OTHER day. Renal dosing 45 tablet 3   ondansetron (ZOFRAN) 4 MG tablet Take 1 tablet (4 mg total) by mouth every 8 (eight) hours as needed for nausea or vomiting.     ondansetron (ZOFRAN) 4 MG tablet Take 1 tablet (4 mg total) by mouth every 8 (eight) hours as needed for nausea or vomiting. 4 tablet 0   oxyCODONE-acetaminophen (PERCOCET/ROXICET) 5-325 MG tablet Take 1 tablet by mouth every 8 (eight) hours as needed for severe pain. 15 tablet 0   cefadroxil (DURICEF) 500 MG capsule Take 1 capsule (500 mg total) by mouth every other day for 5 doses. 5 capsule 0   No current facility-administered medications for this visit.     REVIEW OF SYSTEMS:   Constitutional: Denies fevers, chills or night sweats Eyes: Denies blurriness of vision Ears, nose, mouth, throat, and face: Denies mucositis or sore throat Respiratory: Denies cough, dyspnea or wheezes Cardiovascular: Denies palpitation, chest discomfort or lower extremity swelling Gastrointestinal:  Denies nausea, heartburn or change in bowel habits Skin: Denies abnormal skin rashes Lymphatics: Denies new lymphadenopathy or easy  bruising Neurological:Denies numbness, tingling or new weaknesses Behavioral/Psych: Mood is stable, no new changes  All other systems were reviewed with the patient and are negative.  PHYSICAL EXAMINATION:   Vitals:   04/22/23 0957  BP: 134/62  Pulse: 64  Resp: 19  Temp: 98.3 F (36.8 C)  SpO2: 100%    GENERAL:alert, no distress and comfortable, frail SKIN: Erythema with bruising around the nailbed of left thumb.  No pus.  Nontender. LUNGS: clear to auscultation and percussion with normal breathing effort HEART: regular rate & rhythm and no murmurs and no lower extremity edema ABDOMEN:abdomen soft, non-tender and normal bowel sounds Musculoskeletal:no cyanosis of digits and no clubbing  NEURO: alert & oriented x 3 with fluent speech  LABORATORY DATA:  I have reviewed the data as listed  Lab Results  Component Value Date   WBC 8.8 04/15/2023   NEUTROABS 6.4 04/15/2023   HGB 8.6 (L) 04/22/2023   HCT 29.3 (L) 04/15/2023   MCV 106.2 (H) 04/15/2023   PLT 270 04/15/2023       Chemistry      Component Value  Date/Time   NA 140 03/04/2023 1352   K 4.8 03/04/2023 1352   CL 110 (H) 03/04/2023 1352   CO2 13 (L) 03/04/2023 1352   BUN 35 (H) 03/04/2023 1352   CREATININE 4.21 (HH) 03/04/2023 1352      Component Value Date/Time   CALCIUM 8.9 03/04/2023 1352   ALKPHOS 90 11/13/2022 1932   AST 10 (L) 11/13/2022 1932   ALT 11 11/13/2022 1932   BILITOT 0.4 11/13/2022 1932   BILITOT 0.4 12/30/2021 1458      Latest Reference Range & Units 04/15/23 10:41  Total Protein ELP 6.0 - 8.5 g/dL 6.0 - 8.5 g/dL 5.6 (L) 5.9 (L)  Albumin ELP 2.9 - 4.4 g/dL 3.6  Globulin, Total 2.2 - 3.9 g/dL 2.3 (C)  A/G Ratio 0.7 - 1.7  1.6 (C)  Alpha-1-Globulin 0.0 - 0.4 g/dL 0.3  WUJWJ-1-BJYNWGNF 0.4 - 1.0 g/dL 0.8  Beta Globulin 0.7 - 1.3 g/dL 0.7  Gamma Globulin 0.4 - 1.8 g/dL 0.5  [name]-SPIKE, % Not Observed g/dL 0.3 (H)  SPE Interp.  Comment  Comment  Comment  IgG (Immunoglobin G), Serum 586  - 1,602 mg/dL 621 (L)  IgM (Immunoglobulin [name]), Srm 26 - 217 mg/dL 7 (L)  IgA 64 - 308 mg/dL 27 (L)  (L): Data is abnormally low (H): Data is abnormally high (C): Corrected   Latest Reference Range & Units 04/15/23 10:40 04/15/23 10:41  Kappa free light chain 3.3 - 19.4 mg/L 25.7 (H)   Lambda free light chains 5.7 - 26.3 mg/L 9,048.4 (H)   Kappa, lambda light chain ratio 0.26 - 1.65  0.00 (L)   Immunofixation Result, Serum   Comment ! (C)  (H): Data is abnormally high (L): Data is abnormally low !: Data is abnormal (C): Corrected  Comment: (NOTE) Immunofixation reveals the presence of monoclonal free Lambda light chain. Suggest serum FLC quantitation and/or urine Immunofixation. Immunofixation reveals the presence of monoclonal free Lambda light chain. Suggest serum FLC quantitation and/or urine Immunofixation.

## 2023-04-24 ENCOUNTER — Other Ambulatory Visit: Payer: Self-pay | Admitting: Oncology

## 2023-04-24 DIAGNOSIS — L089 Local infection of the skin and subcutaneous tissue, unspecified: Secondary | ICD-10-CM | POA: Insufficient documentation

## 2023-04-24 MED ORDER — CEFADROXIL 500 MG PO CAPS: 500.0000 mg | ORAL_CAPSULE | ORAL | 0 refills | Status: AC

## 2023-04-24 NOTE — Assessment & Plan Note (Signed)
 Skin infection around left thumb. -Will send antibiotics -Recommended patient to go to the ER if she develops a fever

## 2023-04-25 ENCOUNTER — Encounter: Payer: Self-pay | Admitting: *Deleted

## 2023-04-27 ENCOUNTER — Inpatient Hospital Stay: Payer: 59

## 2023-04-27 ENCOUNTER — Inpatient Hospital Stay: Admitting: Oncology

## 2023-04-27 ENCOUNTER — Ambulatory Visit: Payer: 59 | Admitting: Family Medicine

## 2023-04-27 ENCOUNTER — Inpatient Hospital Stay

## 2023-04-28 ENCOUNTER — Other Ambulatory Visit (HOSPITAL_COMMUNITY)

## 2023-04-28 DIAGNOSIS — D638 Anemia in other chronic diseases classified elsewhere: Secondary | ICD-10-CM | POA: Diagnosis not present

## 2023-04-28 DIAGNOSIS — I5032 Chronic diastolic (congestive) heart failure: Secondary | ICD-10-CM | POA: Diagnosis not present

## 2023-04-28 DIAGNOSIS — N185 Chronic kidney disease, stage 5: Secondary | ICD-10-CM | POA: Diagnosis not present

## 2023-04-28 DIAGNOSIS — E8722 Chronic metabolic acidosis: Secondary | ICD-10-CM | POA: Diagnosis not present

## 2023-04-29 ENCOUNTER — Inpatient Hospital Stay: Payer: 59

## 2023-05-01 DIAGNOSIS — R0789 Other chest pain: Secondary | ICD-10-CM | POA: Diagnosis not present

## 2023-05-01 DIAGNOSIS — R079 Chest pain, unspecified: Secondary | ICD-10-CM | POA: Diagnosis not present

## 2023-05-17 NOTE — Progress Notes (Signed)
 Call received from patient's daughter, Dois Davenport, requesting to cancel all future appointments. Dois Davenport states that she was told my nephrology to follow-up with PCP only moving forward. All future appts cancelled per daughter request.

## 2023-05-18 ENCOUNTER — Inpatient Hospital Stay

## 2023-05-18 ENCOUNTER — Inpatient Hospital Stay: Admitting: Oncology

## 2023-05-23 ENCOUNTER — Telehealth: Payer: Self-pay

## 2023-05-23 NOTE — Telephone Encounter (Signed)
 Copied from CRM [id number]. Topic: General - Other >> May 23, 2023 11:00 AM Emylou G wrote: Reason for CRM: Daughter called.Aaron Aas looking for inhome care for mom.. asap.Aaron Aas Pls call her [phone]

## 2023-05-23 NOTE — Addendum Note (Signed)
 Addended by: Eliodoro Guerin on: 05/23/2023 11:46 AM   Modules accepted: Orders

## 2023-05-23 NOTE — Telephone Encounter (Signed)
 Spoke to sandra. Stat referral placed for hospice services

## 2023-05-23 NOTE — Telephone Encounter (Signed)
 Dr. Bonnell Butcher,  Sending to your to verify if pt needs face to face. Dr. Steen Eden actually seen the pt 72m ago and placed home health referral. When [facility] called to schedule pt declined and stated that she was going to PT- outpt. You seen the patient in January 2025 and she does have a follow up in May.

## 2023-06-29 ENCOUNTER — Ambulatory Visit: Admitting: Family Medicine

## 2023-06-29 NOTE — Progress Notes (Deleted)
 Subjective: CC:*** PCP: Robin Guerin, DO FAO:ZHYQM Robin [name] is a 83 y.o. female presenting to clinic today for:  1. ***   ROS: Per HPI  Allergies  Allergen Reactions   Hydromorphone Other (See Comments)   Hydromorphone Hcl Nausea And Vomiting   Dilaudid [Hydromorphone Hcl] Nausea And Vomiting   Lisinopril Cough and Other (See Comments)   Valium [Diazepam] Other (See Comments)    Family states patient has adverse reaction and displays confusion.    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 (Patient taking differently: Inhale 2 puffs into the lungs as needed (out of breath).), Disp: 10.7 g, Rfl: 11   folic acid  (FOLVITE ) 1 MG tablet, Take 1 tablet (1 mg total) by mouth daily., Disp: 30 tablet, Rfl: 2   loratadine  (CLARITIN ) 10 MG tablet, Take 1 tablet every OTHER day. Renal dosing, Disp: 45 tablet, Rfl: 3   ondansetron  (ZOFRAN ) 4 MG tablet, Take 1 tablet (4 mg total) by mouth every 8 (eight) hours as needed for nausea or vomiting., Disp: , Rfl:    ondansetron  (ZOFRAN ) 4 MG tablet, Take 1 tablet (4 mg total) by mouth every 8 (eight) hours as needed for nausea or vomiting., Disp: 4 tablet, Rfl: 0   oxyCODONE -acetaminophen  (PERCOCET/ROXICET) 5-325 MG tablet, Take 1 tablet by mouth every 8 (eight) hours as needed for severe pain., Disp: 15 tablet, Rfl: 0 Social  History   Socioeconomic History   Marital status: Widowed    Spouse name: Not on file   Number of children: 4   Years of education: 29   Highest education level: 11th grade  Occupational History   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  Tobacco Use   Smoking status: Former    Current packs/day: 0.00    Average packs/day: 0.3 packs/day for 5.0 years (1.3 ttl pk-yrs)    Types: Cigarettes    Start date: 06/30/1972    Quit date: 06/30/1977    Years since quitting: 46.0   Smokeless tobacco: Never  Vaping Use   Vaping status: Never Used  Substance and Sexual Activity   Alcohol use: No   Drug use: No   Sexual activity: Not Currently  Other Topics Concern   Not on file  Social History Narrative   Not on file   Social Drivers of Health   Financial Resource Strain: Low Risk  (05/18/2022)   Overall Financial Resource Strain (CARDIA)    Difficulty of Paying Living Expenses: Not hard at all  Food Insecurity: No Food Insecurity (03/08/2023)   Hunger Vital Sign    Worried About Running Out of Food in the Last Year: Never true    Ran Out of Food in the Last Year: Never true  Transportation Needs: No Transportation Needs (03/08/2023)   PRAPARE -  Administrator, Civil Service (Medical): No    Lack of Transportation (Non-Medical): No  Physical Activity: Inactive (05/18/2022)   Exercise Vital Sign    Days of Exercise per Week: 0 days    Minutes of Exercise per Session: 0 min  Stress: No Stress Concern Present (05/18/2022)   [employer] of Occupational Health - Occupational Stress Questionnaire    Feeling of Stress : Not at all  Social Connections: Socially Isolated (05/18/2022)   Social Connection and Isolation Panel [NHANES]    Frequency of Communication with Friends and Family: More than three times a week    Frequency of Social Gatherings with Friends and Family: More than three times a week    Attends Religious Services: Never     Database administrator or Organizations: No    Attends Banker Meetings: Never    Marital Status: Widowed  Intimate Partner Violence: Not At Risk (03/08/2023)   Humiliation, Afraid, Rape, and Kick questionnaire    Fear of Current or Ex-Partner: No    Emotionally Abused: No    Physically Abused: No    Sexually Abused: No   Family History  Problem Relation Age of Onset   Diabetes Mother    Diabetes Sister    Healthy Daughter    Healthy Son    Cancer Brother    Healthy Daughter     Objective: Office vital signs reviewed. There were no vitals taken for this visit.  Physical Examination:  General: Awake, alert, *** nourished, No acute distress HEENT: Normal    Neck: No masses palpated. No lymphadenopathy    Ears: Tympanic membranes intact, normal light reflex, no erythema, no bulging    Eyes: PERRLA, extraocular membranes intact, sclera ***    Nose: nasal turbinates moist, *** nasal discharge    Throat: moist mucus membranes, no erythema, *** tonsillar exudate.  Airway is patent Cardio: regular rate and rhythm, S1S2 heard, no murmurs appreciated Pulm: clear to auscultation bilaterally, no wheezes, rhonchi or rales; normal work of breathing on room air GI: soft, non-tender, non-distended, bowel sounds present x4, no hepatomegaly, no splenomegaly, no masses GU: external vaginal tissue ***, cervix ***, *** punctate lesions on cervix appreciated, *** discharge from cervical os, *** bleeding, *** cervical motion tenderness, *** abdominal/ adnexal masses Extremities: warm, well perfused, No edema, cyanosis or clubbing; +*** pulses bilaterally MSK: *** gait and *** station Skin: dry; intact; no rashes or lesions Neuro: *** Strength and light touch sensation grossly intact, *** DTRs ***/4  Assessment/ Plan: 83 y.o. female   No diagnosis found.  ***   Robin Greb Bambi Bonine, DO [facility] [phone]

## 2023-09-21 ENCOUNTER — Encounter (HOSPITAL_COMMUNITY): Payer: Self-pay

## 2023-09-21 ENCOUNTER — Other Ambulatory Visit: Payer: Self-pay

## 2023-09-21 ENCOUNTER — Emergency Department (HOSPITAL_COMMUNITY)
Admission: EM | Admit: 2023-09-21 | Discharge: 2023-09-21 | Disposition: A | Discharge status: Home / Self Care | Attending: Emergency Medicine | Admitting: Emergency Medicine

## 2023-09-21 DIAGNOSIS — Z7901 Long term (current) use of anticoagulants: Secondary | ICD-10-CM | POA: Diagnosis not present

## 2023-09-21 DIAGNOSIS — R4182 Altered mental status, unspecified: Secondary | ICD-10-CM | POA: Diagnosis present

## 2023-09-21 DIAGNOSIS — G459 Transient cerebral ischemic attack, unspecified: Secondary | ICD-10-CM | POA: Diagnosis not present

## 2023-09-21 NOTE — ED Triage Notes (Signed)
 Pt is at home under hospice care due to Cancer. Family said that she had a period of slurred speech and not looking at them today. Did not give a definite time this happened. Family said she is prone to TIA's. Upon EMS arrival all symptoms resolved. Pt is alert and oriented, x4. Able to move facial features normally, and able to move all extremities normally

## 2023-09-21 NOTE — ED Provider Notes (Signed)
 [place] EMERGENCY DEPARTMENT AT [facility] Provider Note   CSN: [id number] Arrival date & time: 09/21/23  1948     Patient presents with: Altered Mental Status   Robin [name] is a 83 y.o. female.    Altered Mental Status    This patient is an 83 year old female, she has a history of renal failure, she has had a creatinine that is gradually elevating over time, it was most recently measured at 4.2 about 6 months ago and she was told by nephrology that she would not be a good dialysis candidate, the family is agreeable that they do not want dialysis and she has not been concerned about that.  She was in her usual state of health until today when she was in the kitchen leaning against a wall with some facial droop and difficulty speaking.  At this time all of this things have resolved.  It started about an hour and a half ago.  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  The patient had been on blood thinners in the past but because of falls and significant bleeding these were discontinued.  She is no longer on any blood thinner therapy.  The patient has no complaints at this time.  Prior to Admission medications   Medication Sig Start Date End Date Taking? Authorizing Provider  albuterol  (VENTOLIN  HFA) 108 (90 Base) MCG/ACT inhaler Inhale 2 puffs into the lungs every 6 (six) hours as needed for wheezing or shortness of breath. 08/30/22   [facility] Pool, NP  apixaban  (ELIQUIS ) 5 MG TABS tablet Take 1 tablet (5 mg total) by mouth 2 (two) times daily. 06/29/22   Jolinda Norene HERO, DO  Budeson-Glycopyrrol-Formoterol (BREZTRI  AEROSPHERE) 160-9-4.8 MCG/ACT AERO Inhale 2 puffs into the lungs 2 (two) times daily. Patient taking differently: Inhale 2 puffs into the lungs as needed (out of breath). 09/14/22   Jolinda Norene HERO, DO  folic  acid (FOLVITE ) 1 MG tablet Take 1 tablet (1 mg total) by mouth daily. 03/09/23   Davonna Siad, MD  loratadine  (CLARITIN ) 10 MG tablet Take 1 tablet every OTHER day. Renal dosing 09/09/22   Jolinda Norene M, DO  ondansetron  (ZOFRAN ) 4 MG tablet Take 1 tablet (4 mg total) by mouth every 8 (eight) hours as needed for nausea or vomiting. 10/11/22   Zackowski, Scott, MD  ondansetron  (ZOFRAN ) 4 MG tablet Take 1 tablet (4 mg total) by mouth every 8 (eight) hours as needed for nausea or vomiting. 10/11/22   Zackowski, Scott, MD  oxyCODONE -acetaminophen  (PERCOCET/ROXICET) 5-325 MG tablet Take 1 tablet by mouth every 8 (eight) hours as needed for severe pain. 11/13/22   Cleotilde Rogue, MD    Allergies: Hydromorphone, Hydromorphone hcl, Dilaudid [hydromorphone hcl], Lisinopril, and Valium [diazepam]    Review of Systems  All other systems reviewed and are negative.   Updated Vital Signs BP (!) 129/100 (BP Location: Left Arm)   Pulse 85   Temp 98.5 F (36.9 C) (Oral)   Resp 18   Ht 1.626 m (5' 4)   Wt 39.5 kg   SpO2 100%   BMI 14.93 kg/m   Physical Exam Vitals and nursing note reviewed.  Constitutional:      General: She is not in acute distress.    Appearance: She is well-developed.  HENT:     Head: Normocephalic and atraumatic.  Mouth/Throat:     Pharynx: No oropharyngeal exudate.  Eyes:     General: No scleral icterus.       Right eye: No discharge.        Left eye: No discharge.     Conjunctiva/sclera: Conjunctivae normal.     Pupils: Pupils are equal, round, and reactive to light.  Neck:     Thyroid : No thyromegaly.     Vascular: No JVD.  Cardiovascular:     Rate and Rhythm: Normal rate and regular rhythm.     Heart sounds: Normal heart sounds. No murmur heard.    No friction rub. No gallop.  Pulmonary:     Effort: Pulmonary effort is normal. No respiratory distress.     Breath sounds: Normal breath sounds. No wheezing or rales.  Abdominal:     General: Bowel sounds are  normal. There is no distension.     Palpations: Abdomen is soft. There is no mass.     Tenderness: There is no abdominal tenderness.  Musculoskeletal:        General: No tenderness. Normal range of motion.     Cervical back: Normal range of motion and neck supple.     Right lower leg: No edema.     Left lower leg: No edema.  Lymphadenopathy:     Cervical: No cervical adenopathy.  Skin:    General: Skin is warm and dry.     Findings: No erythema or rash.  Neurological:     Mental Status: She is alert.     Coordination: Coordination normal.     Comments: No facial droop, normal speech, follows commands, hard of hearing but otherwise at her baseline according to family.  Psychiatric:        Behavior: Behavior normal.     (all labs ordered are listed, but only abnormal results are displayed) Labs Reviewed - No data to display  EKG: EKG Interpretation Date/Time:  Wednesday September 21 2023 20:06:14 EDT Ventricular Rate:  76 PR Interval:  143 QRS Duration:  76 QT Interval:  405 QTC Calculation: 456 R Axis:   65  Text Interpretation: Sinus rhythm Consider left ventricular hypertrophy Borderline ST elevation, lateral leads Confirmed by Cleotilde Rogue ([id number]) on 09/21/2023 8:10:04 PM  Radiology: No results found.   Procedures   Medications Ordered in the ED - No data to display                                  Medical Decision Making  This patient has no focal neurologic abnormalities, her blood pressure is 129/100.  Based on her history of not wanting treatment for her renal failure or her hematologic cancer the family was given the option of supportive care at home versus aggressive management including coming into the hospital for further evaluation and possibly being on a blood thinner based on the likelihood that this was a TIA.  I have given them the risk benefits and alternatives and they have chosen to go home without any further management evaluation or admission and  request that she not have any of those tests as they would not pursue any aggressive treatment.  The patient and family members are agreeable, this is their decision when they were given the option for admission and management.  They decide to go home.  I think this is reasonable     Final diagnoses:  TIA (transient ischemic attack)  ED Discharge Orders     None          Cleotilde Rogue, MD 09/21/23 2103

## 2023-09-21 NOTE — Discharge Instructions (Addendum)
 I have given you the option of evaluation with x-rays blood work and admission to the hospital for a possible stroke workup.  You have chosen to go home, you can follow-up with your family doctor in the office.  Return to the emergency department if you change your mind or if anything worsens.

## 2023-09-22 ENCOUNTER — Encounter: Payer: Self-pay | Admitting: Oncology

## 2023-12-26 ENCOUNTER — Telehealth: Payer: Self-pay | Admitting: Family Medicine

## 2023-12-26 NOTE — Telephone Encounter (Signed)
 Call center nurse called  to inform me that Hospice called to let the office know the pt had passed away. Date of death 24-Dec-2023.

## 2024-01-09 DEATH — deceased
